# Patient Record
Sex: Female | Born: 1990 | Race: Black or African American | Hispanic: No | Marital: Single | Smoking: Former smoker
Health system: Southern US, Community
[De-identification: ages and names within clinical notes are randomized; demographics above are authoritative.]

## PROBLEM LIST (undated history)

## (undated) ENCOUNTER — Emergency Department (HOSPITAL_COMMUNITY): Discharge status: Against Medical Advice | Payer: Managed Care, Other (non HMO)

## (undated) DIAGNOSIS — O26649 Intrahepatic cholestasis of pregnancy, unspecified trimester: Secondary | ICD-10-CM

## (undated) DIAGNOSIS — B009 Herpesviral infection, unspecified: Secondary | ICD-10-CM

## (undated) DIAGNOSIS — B379 Candidiasis, unspecified: Secondary | ICD-10-CM

## (undated) DIAGNOSIS — R350 Frequency of micturition: Secondary | ICD-10-CM

## (undated) DIAGNOSIS — D649 Anemia, unspecified: Secondary | ICD-10-CM

## (undated) DIAGNOSIS — N390 Urinary tract infection, site not specified: Secondary | ICD-10-CM

## (undated) DIAGNOSIS — N898 Other specified noninflammatory disorders of vagina: Principal | ICD-10-CM

## (undated) DIAGNOSIS — R319 Hematuria, unspecified: Secondary | ICD-10-CM

## (undated) DIAGNOSIS — N920 Excessive and frequent menstruation with regular cycle: Secondary | ICD-10-CM

## (undated) DIAGNOSIS — K831 Obstruction of bile duct: Secondary | ICD-10-CM

## (undated) DIAGNOSIS — Z309 Encounter for contraceptive management, unspecified: Secondary | ICD-10-CM

## (undated) HISTORY — DX: Encounter for contraceptive management, unspecified: Z30.9

## (undated) HISTORY — DX: Other specified noninflammatory disorders of vagina: N89.8

## (undated) HISTORY — DX: Candidiasis, unspecified: B37.9

## (undated) HISTORY — DX: Frequency of micturition: R35.0

## (undated) HISTORY — PX: NO PAST SURGERIES: SHX2092

## (undated) HISTORY — DX: Intrahepatic cholestasis of pregnancy, unspecified trimester: O26.649

## (undated) HISTORY — DX: Hematuria, unspecified: R31.9

## (undated) HISTORY — DX: Obstruction of bile duct: K83.1

## (undated) HISTORY — DX: Excessive and frequent menstruation with regular cycle: N92.0

---
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
## 2010-12-02 ENCOUNTER — Emergency Department (HOSPITAL_COMMUNITY)
Admission: EM | Admit: 2010-12-02 | Discharge: 2010-12-02 | Disposition: A | Discharge status: Home / Self Care | Payer: 59 | Attending: Emergency Medicine | Admitting: Emergency Medicine

## 2010-12-02 DIAGNOSIS — R42 Dizziness and giddiness: Secondary | ICD-10-CM | POA: Insufficient documentation

## 2010-12-02 DIAGNOSIS — E860 Dehydration: Secondary | ICD-10-CM | POA: Insufficient documentation

## 2010-12-02 DIAGNOSIS — R55 Syncope and collapse: Secondary | ICD-10-CM | POA: Insufficient documentation

## 2010-12-02 DIAGNOSIS — N390 Urinary tract infection, site not specified: Secondary | ICD-10-CM | POA: Insufficient documentation

## 2010-12-02 DIAGNOSIS — R350 Frequency of micturition: Secondary | ICD-10-CM | POA: Insufficient documentation

## 2010-12-02 DIAGNOSIS — R300 Dysuria: Secondary | ICD-10-CM | POA: Insufficient documentation

## 2010-12-02 LAB — URINE MICROSCOPIC-ADD ON

## 2010-12-02 LAB — POCT I-STAT, CHEM 8
BUN: 12 mg/dL (ref 6–23)
Calcium, Ion: 1.21 mmol/L (ref 1.12–1.32)
Chloride: 104 mEq/L (ref 96–112)
HCT: 41 % (ref 36.0–46.0)
Sodium: 138 mEq/L (ref 135–145)
TCO2: 25 mmol/L (ref 0–100)

## 2010-12-02 LAB — URINALYSIS, ROUTINE W REFLEX MICROSCOPIC
Ketones, ur: NEGATIVE mg/dL
Leukocytes, UA: NEGATIVE
Protein, ur: 30 mg/dL — AB
Urine Glucose, Fasting: NEGATIVE mg/dL
pH: 5.5 (ref 5.0–8.0)

## 2010-12-04 LAB — URINE CULTURE

## 2012-06-06 ENCOUNTER — Emergency Department (HOSPITAL_COMMUNITY)
Admission: EM | Admit: 2012-06-06 | Discharge: 2012-06-06 | Disposition: A | Discharge status: Home / Self Care | Payer: Managed Care, Other (non HMO) | Attending: Emergency Medicine | Admitting: Emergency Medicine

## 2012-06-06 ENCOUNTER — Encounter (HOSPITAL_COMMUNITY): Payer: Self-pay | Admitting: *Deleted

## 2012-06-06 DIAGNOSIS — R300 Dysuria: Secondary | ICD-10-CM | POA: Insufficient documentation

## 2012-06-06 DIAGNOSIS — Z880 Allergy status to penicillin: Secondary | ICD-10-CM | POA: Insufficient documentation

## 2012-06-06 LAB — URINALYSIS, ROUTINE W REFLEX MICROSCOPIC
Glucose, UA: NEGATIVE mg/dL
Hgb urine dipstick: NEGATIVE
Leukocytes, UA: NEGATIVE
pH: 6 (ref 5.0–8.0)

## 2012-06-06 LAB — PREGNANCY, URINE: Preg Test, Ur: NEGATIVE

## 2012-06-06 LAB — WET PREP, GENITAL: Trich, Wet Prep: NONE SEEN

## 2012-06-06 MED ORDER — DOXYCYCLINE HYCLATE 100 MG PO CAPS: 100.0000 mg | ORAL_CAPSULE | Freq: Two times a day (BID) | ORAL | Status: AC

## 2012-06-06 MED ORDER — FLUCONAZOLE 200 MG PO TABS: ORAL_TABLET | ORAL | Status: DC

## 2012-06-06 MED ORDER — METRONIDAZOLE 500 MG PO TABS: 500.0000 mg | ORAL_TABLET | Freq: Two times a day (BID) | ORAL | Status: AC

## 2012-06-06 NOTE — ED Provider Notes (Signed)
History     CSN: [id number]  Arrival date & time 06/06/12  1125   First MD Initiated Contact with Patient 06/06/12 1218      Chief Complaint  Patient presents with  . Dysuria    (Consider location/radiation/quality/duration/timing/severity/associated sxs/prior treatment) Patient is a 21 y.o. female presenting with dysuria. The history is provided by the patient.  Dysuria  This is a new problem. The current episode started more than 1 week ago. The problem occurs intermittently. The problem has not changed since onset.The quality of the pain is described as burning. The pain is mild. There has been no fever. She is sexually active. There is no history of pyelonephritis. Associated symptoms include frequency and urgency. Pertinent negatives include no chills, no nausea, no vomiting, no discharge, no hematuria, no hesitancy, no possible pregnancy and no flank pain. She has tried nothing for the symptoms. Her past medical history does not include recurrent UTIs.    History reviewed. No pertinent past medical history.  History reviewed. No pertinent past surgical history.  No family history on file.  History  Substance Use Topics  . Smoking status: Never Smoker   . Smokeless tobacco: Not on file  . Alcohol Use: No    OB History    Grav Para Term Preterm Abortions TAB SAB Ect Mult Living                  Review of Systems  Constitutional: Negative for chills, activity change and appetite change.  Gastrointestinal: Negative for nausea, vomiting and abdominal pain.  Genitourinary: Positive for dysuria, urgency and frequency. Negative for hesitancy, hematuria, flank pain, vaginal bleeding, vaginal discharge, difficulty urinating, genital sores, vaginal pain, menstrual problem and pelvic pain.  Musculoskeletal: Negative for back pain.  All other systems reviewed and are negative.    Allergies  Penicillins  Home Medications   Current Outpatient Rx  Name Route Sig Dispense  Refill  . DOXYCYCLINE HYCLATE 100 MG PO CAPS Oral Take 1 capsule (100 mg total) by mouth 2 (two) times daily. 20 capsule 0  . FLUCONAZOLE 200 MG PO TABS  One tablet po as single dose 1 tablet 0  . METRONIDAZOLE 500 MG PO TABS Oral Take 1 tablet (500 mg total) by mouth 2 (two) times daily. 14 tablet 0    BP 125/84  Pulse 94  Temp 98.4 F (36.9 C) (Oral)  Resp 18  Ht 5\' 2"  (1.575 m)  Wt 126 lb (57.153 kg)  BMI 23.05 kg/m2  SpO2 100%  Physical Exam  Nursing note and vitals reviewed. Constitutional: She is oriented to person, place, and time. She appears well-developed and well-nourished. No distress.  HENT:  Head: Normocephalic and atraumatic.  Mouth/Throat: Oropharynx is clear and moist.  Cardiovascular: Normal rate, regular rhythm, normal heart sounds and intact distal pulses.   No murmur heard. Pulmonary/Chest: Effort normal and breath sounds normal. No respiratory distress.  Abdominal: Soft. She exhibits no distension and no mass. There is no tenderness. There is no rebound and no guarding.  Genitourinary: Uterus normal. There is no tenderness or injury on the right labia. There is no tenderness or injury on the left labia. Cervix exhibits no motion tenderness, no discharge and no friability. Right adnexum displays no mass and no tenderness. Left adnexum displays no mass and no tenderness. No erythema around the vagina. No foreign body around the vagina. No signs of injury around the vagina. Vaginal discharge found.       Mild to moderate  whitish discharge present in the vaginal vault.  No adnexal masses, tenderness or CMT on exam.  No vaginal bleeding. Cervix is nml appearing.    Musculoskeletal: Normal range of motion.  Neurological: She is alert and oriented to person, place, and time. She exhibits normal muscle tone. Coordination normal.  Skin: Skin is warm and dry.    ED Course  Procedures (including critical care time)  Results for orders placed during the hospital  encounter of 06/06/12  URINALYSIS, ROUTINE W REFLEX MICROSCOPIC      Component Value Range   Color, Urine YELLOW  YELLOW   APPearance CLEAR  CLEAR   Specific Gravity, Urine 1.025  1.005 - 1.030   pH 6.0  5.0 - 8.0   Glucose, UA NEGATIVE  NEGATIVE mg/dL   Hgb urine dipstick NEGATIVE  NEGATIVE   Bilirubin Urine NEGATIVE  NEGATIVE   Ketones, ur NEGATIVE  NEGATIVE mg/dL   Protein, ur NEGATIVE  NEGATIVE mg/dL   Urobilinogen, UA 0.2  0.0 - 1.0 mg/dL   Nitrite NEGATIVE  NEGATIVE   Leukocytes, UA NEGATIVE  NEGATIVE  PREGNANCY, URINE      Component Value Range   Preg Test, Ur NEGATIVE  NEGATIVE  GC/CHLAMYDIA PROBE AMP, GENITAL      Component Value Range   GC Probe Amp, Genital NEGATIVE  NEGATIVE   Chlamydia, DNA Probe NEGATIVE  NEGATIVE  WET PREP, GENITAL      Component Value Range   Yeast Wet Prep HPF POC FEW (*) NONE SEEN   Trich, Wet Prep NONE SEEN  NONE SEEN   Clue Cells Wet Prep HPF POC RARE (*) NONE SEEN   WBC, Wet Prep HPF POC RARE (*) NONE SEEN  URINE CULTURE      Component Value Range   Specimen Description URINE, CLEAN CATCH     Special Requests NONE     Culture  Setup Time 06/07/2012 00:29     Colony Count 30,000 COLONIES/ML     Culture       Value: LACTOBACILLUS SPECIES     Note: Standardized susceptibility testing for this organism is not available.   Report Status 06/08/2012 FINAL        1. Dysuria       MDM    gc chlaymdia and urie cultures pending  Sexually active patient with dysuria sx's.  She is otherwise well appearing , abd is NT.  No guarding or rebound tenderness.  Doubtful of TOA or PID.  Will treat with flagyl and doxy  Advised pt that she will need recheck by her GYN or to return here if her sx's worsen.  She verbalized understanding and agreed to care plan   The patient appears reasonably screened and/or stabilized for discharge and I doubt any other medical condition or other [facility] requiring further screening, evaluation, or treatment in the ED  at this time prior to discharge.     Sherald Balbuena L. Bocephus Cali, [place] 06/10/12 1216

## 2012-06-06 NOTE — ED Notes (Signed)
Dysuria, frequency,no n/v. For 10 days  No fever

## 2012-06-07 LAB — GC/CHLAMYDIA PROBE AMP, GENITAL
Chlamydia, DNA Probe: NEGATIVE
GC Probe Amp, Genital: NEGATIVE

## 2012-06-08 LAB — URINE CULTURE: Colony Count: 30000

## 2012-06-09 NOTE — ED Notes (Signed)
+   Urine Chart sent to EDP office for review. 

## 2012-06-10 NOTE — ED Provider Notes (Signed)
Medical screening examination/treatment/procedure(s) were performed by non-physician practitioner and as supervising physician I was immediately available for consultation/collaboration.   Yoland Scherr L Averyanna Sax, MD 06/10/12 1233 

## 2012-06-10 NOTE — ED Notes (Signed)
Chart returned from EDP office for review. No further treatment per [facility] PA-C.

## 2012-09-03 ENCOUNTER — Encounter (HOSPITAL_COMMUNITY): Payer: Self-pay | Admitting: Emergency Medicine

## 2012-09-03 ENCOUNTER — Emergency Department (HOSPITAL_COMMUNITY)
Admission: EM | Admit: 2012-09-03 | Discharge: 2012-09-03 | Disposition: A | Discharge status: Home / Self Care | Payer: Managed Care, Other (non HMO) | Attending: Emergency Medicine | Admitting: Emergency Medicine

## 2012-09-03 DIAGNOSIS — L258 Unspecified contact dermatitis due to other agents: Secondary | ICD-10-CM | POA: Insufficient documentation

## 2012-09-03 DIAGNOSIS — T7840XA Allergy, unspecified, initial encounter: Secondary | ICD-10-CM

## 2012-09-03 MED ORDER — FAMOTIDINE IN NACL 20-0.9 MG/50ML-% IV SOLN
20.0000 mg | Freq: Once | INTRAVENOUS | Status: AC
  Administered 2012-09-03: 20 mg via INTRAVENOUS
  Filled 2012-09-03: qty 50

## 2012-09-03 MED ORDER — METHYLPREDNISOLONE SODIUM SUCC 125 MG IJ SOLR
125.0000 mg | Freq: Once | INTRAMUSCULAR | Status: AC
  Administered 2012-09-03: 125 mg via INTRAVENOUS
  Filled 2012-09-03: qty 2

## 2012-09-03 MED ORDER — PREDNISONE 50 MG PO TABS: ORAL_TABLET | ORAL | Status: DC

## 2012-09-03 MED ORDER — DIPHENHYDRAMINE HCL 50 MG/ML IJ SOLN
25.0000 mg | Freq: Once | INTRAMUSCULAR | Status: AC
  Administered 2012-09-03: 25 mg via INTRAVENOUS
  Filled 2012-09-03: qty 1

## 2012-09-03 MED ORDER — SODIUM CHLORIDE 0.9 % IV BOLUS (SEPSIS)
1000.0000 mL | Freq: Once | INTRAVENOUS | Status: AC
  Administered 2012-09-03: 1000 mL via INTRAVENOUS

## 2012-09-03 NOTE — ED Notes (Signed)
Pt states increased rash and itching for two weeks. Generalized to body per patient. Pt states started a new birth control pill three weeks ago but has not changed anything else. Denies SOB or throat swelling

## 2012-09-03 NOTE — ED Notes (Signed)
Patient with no complaints at this time. Respirations even and unlabored. Skin warm/dry. Discharge instructions reviewed with patient at this time. Patient given opportunity to voice concerns/ask questions. IV removed per policy and band-aid applied to site. Patient discharged at this time and left Emergency Department with steady gait.  

## 2012-09-03 NOTE — ED Provider Notes (Signed)
History   This chart was scribed for Donnetta Hutching, MD by Charolett Bumpers, ER Scribe. The patient was seen in room APA03/APA03. Patient's care was started at 1047.   CSN: [id number]  Arrival date & time 09/03/12  1004   First MD Initiated Contact with Patient 09/03/12 1047      Chief Complaint  Patient presents with  . Allergic Reaction  . Pruritis  . Rash    The history is provided by the patient. No language interpreter was used.  MELENY ROCHESTER is a 21 y.o. female who presents to the Emergency Department complaining of constant, moderate generalized pruritis for the past 2 weeks with the worse location on bottom of feet. She reports an associated rash to her inner arms. She states that she started taking a birth control pill, Trisprintec, a couple of months ago. She states that she has also been drinking Biotin for her hair for the past 1.5 weeks after the itching started. She denies any modifying factors. She denies any other new exposures. She denies any SOB, difficulty swallowing or breathing. She states she is otherwise normally healthy.   History reviewed. No pertinent past medical history.  History reviewed. No pertinent past surgical history.  History reviewed. No pertinent family history.  History  Substance Use Topics  . Smoking status: Never Smoker   . Smokeless tobacco: Not on file  . Alcohol Use: No    OB History    Grav Para Term Preterm Abortions TAB SAB Ect Mult Living                  Review of Systems A complete 10 system review of systems was obtained and all systems are negative except as noted in the HPI and PMH.   Allergies  Penicillins  Home Medications   Current Outpatient Rx  Name  Route  Sig  Dispense  Refill  . BIOTIN PO   Oral   Take 3 tablets by mouth daily.         Marland Kitchen FLUCONAZOLE 200 MG PO TABS   Oral   Take 200 mg by mouth daily.         Marland Kitchen NORGESTIM-ETH ESTRAD TRIPHASIC 0.18/0.215/0.25 MG-35 MCG PO TABS   Oral  Take 1 tablet by mouth daily.           BP 125/80  Pulse 90  Temp 98.5 F (36.9 C) (Oral)  Ht 5\' 2"  (1.575 m)  Wt 130 lb (58.968 kg)  BMI 23.78 kg/m2  SpO2 98%  LMP 08/13/2012  Physical Exam  Nursing note and vitals reviewed. Constitutional: She is oriented to person, place, and time. She appears well-developed and well-nourished.  HENT:  Head: Normocephalic and atraumatic.  Eyes: Conjunctivae normal and EOM are normal. Pupils are equal, round, and reactive to light.  Neck: Normal range of motion. Neck supple.  Cardiovascular: Normal rate, regular rhythm and normal heart sounds.   Pulmonary/Chest: Effort normal and breath sounds normal.  Abdominal: Soft. Bowel sounds are normal.  Musculoskeletal: Normal range of motion.  Neurological: She is alert and oriented to person, place, and time.  Skin: Skin is warm and dry. Rash noted. Rash is papular.       On bilateral antecubitals, a discrete fine papular rash.   Psychiatric: She has a normal mood and affect.    ED Course  Procedures (including critical care time)  DIAGNOSTIC STUDIES: Oxygen Saturation is 98% on room air, normal by my interpretation.    COORDINATION  OF CARE:  11:00-Medication Orders: Methylprednisolone sodium succinate (Solu-medrol) 125 mg/2 mL injection 125 mg-once; Diphenhydramine (Benadryl) injection 25 mg-once; Famotidine (Pepcid) IVPB 20 mg-once; Sodium chloride 0.9% bolus 1,000 mL-once  11:15-Discussed planned course of treatment with the patient including Solu-medrol, Benadryl and Pepcid her in ED and d/c home with Claritin and Prednisone, who is agreeable at this time.    Labs Reviewed - No data to display No results found.   No diagnosis found.    MDM  Mild allergic reaction, uncertain etiology. Patient is a good response to IV Solu-Medrol, Pepcid, Benadryl.  A shared home and prednisone 50 mg daily for 6 days, OTC Claritin and Pepcid   I personally performed the services described in  this documentation, which was scribed in my presence. The recorded information has been reviewed and is accurate.      Donnetta Hutching, MD 09/03/12 1401

## 2012-09-21 ENCOUNTER — Other Ambulatory Visit (HOSPITAL_COMMUNITY): Payer: Self-pay | Admitting: Internal Medicine

## 2012-09-21 DIAGNOSIS — N63 Unspecified lump in unspecified breast: Secondary | ICD-10-CM

## 2012-09-26 ENCOUNTER — Ambulatory Visit (HOSPITAL_COMMUNITY): Payer: Managed Care, Other (non HMO) | Attending: Internal Medicine

## 2012-10-12 ENCOUNTER — Emergency Department (HOSPITAL_COMMUNITY)
Admission: EM | Admit: 2012-10-12 | Discharge: 2012-10-12 | Disposition: A | Discharge status: Home / Self Care | Payer: Managed Care, Other (non HMO) | Attending: Emergency Medicine | Admitting: Emergency Medicine

## 2012-10-12 ENCOUNTER — Encounter (HOSPITAL_COMMUNITY): Payer: Self-pay | Admitting: *Deleted

## 2012-10-12 DIAGNOSIS — N3090 Cystitis, unspecified without hematuria: Secondary | ICD-10-CM | POA: Insufficient documentation

## 2012-10-12 DIAGNOSIS — R300 Dysuria: Secondary | ICD-10-CM | POA: Insufficient documentation

## 2012-10-12 DIAGNOSIS — R3915 Urgency of urination: Secondary | ICD-10-CM | POA: Insufficient documentation

## 2012-10-12 DIAGNOSIS — R109 Unspecified abdominal pain: Secondary | ICD-10-CM | POA: Insufficient documentation

## 2012-10-12 DIAGNOSIS — Z79899 Other long term (current) drug therapy: Secondary | ICD-10-CM | POA: Insufficient documentation

## 2012-10-12 DIAGNOSIS — R319 Hematuria, unspecified: Secondary | ICD-10-CM | POA: Insufficient documentation

## 2012-10-12 DIAGNOSIS — R350 Frequency of micturition: Secondary | ICD-10-CM | POA: Insufficient documentation

## 2012-10-12 LAB — URINALYSIS, ROUTINE W REFLEX MICROSCOPIC
Nitrite: NEGATIVE
Protein, ur: 30 mg/dL — AB
Specific Gravity, Urine: >1.03 — ABNORMAL HIGH (ref 1.005–1.030)
Urobilinogen, UA: 1 mg/dL (ref 0.0–1.0)

## 2012-10-12 LAB — URINE MICROSCOPIC-ADD ON

## 2012-10-12 LAB — POCT PREGNANCY, URINE: Preg Test, Ur: NEGATIVE

## 2012-10-12 MED ORDER — PHENAZOPYRIDINE HCL 200 MG PO TABS: 200.0000 mg | ORAL_TABLET | Freq: Three times a day (TID) | ORAL | Status: DC

## 2012-10-12 MED ORDER — CIPROFLOXACIN HCL 500 MG PO TABS: 500.0000 mg | ORAL_TABLET | Freq: Two times a day (BID) | ORAL | Status: DC

## 2012-10-12 NOTE — ED Provider Notes (Signed)
Medical screening examination/treatment/procedure(s) were performed by non-physician practitioner and as supervising physician I was immediately available for consultation/collaboration.   Joya Gaskins, MD 10/12/12 [phone]

## 2012-10-12 NOTE — ED Notes (Signed)
Frequency of urination,with itching,hematuria.  No NVD.  Alert.

## 2012-10-12 NOTE — Discharge Instructions (Signed)
Urinary Tract Infection  A urinary tract infection (UTI) is often caused by a germ (bacteria). A UTI is usually helped with medicine (antibiotics) that kills germs. Take all the medicine until it is gone. Do this even if you are feeling better. You are usually better in 7 to 10 days.  HOME CARE    Drink enough water and fluids to keep your pee (urine) clear or pale yellow. Drink:   Cranberry juice.   Water.   Avoid:   Caffeine.   Tea.   Bubbly (carbonated) drinks.   Alcohol.   Only take medicine as told by your doctor.   To prevent further infections:   Pee often.   After pooping (bowel movement), women should wipe from front to back. Use each tissue only once.   Pee before and after having sex (intercourse).  Ask your doctor when your test results will be ready. Make sure you follow up and get your test results.   GET HELP RIGHT AWAY IF:    There is very bad back pain or lower belly (abdominal) pain.   You get the chills.   You have a fever.   Your baby is older than 3 months with a rectal temperature of 102 F (38.9 C) or higher.   Your baby is 3 months old or younger with a rectal temperature of 100.4 F (38 C) or higher.   You feel sick to your stomach (nauseous) or throw up (vomit).   There is continued burning with peeing.   Your problems are not better in 3 days. Return sooner if you are getting worse.  MAKE SURE YOU:    Understand these instructions.   Will watch your condition.   Will get help right away if you are not doing well or get worse.  Document Released: 03/28/2008 Document Revised: 01/02/2012 Document Reviewed: 03/28/2008  [employer] Patient Information 2013 [employer].

## 2012-10-12 NOTE — ED Provider Notes (Signed)
History     CSN: [id number]  Arrival date & time 10/12/12  1351   None     Chief Complaint  Patient presents with  . Dysuria    HPI Alicia [name] is a 21 y.o. female who presents to the ED with dysuria. The symptoms started 3 or days ago. Associated symptoms include frequent urination and urgency. Has had UTI's before and this feels the same.Condoms and OC's for birth control. Current sex partner x 6 months.  Just had pap and yearly physical and STI screening 3 weeks ago and is scheduled to return due to abnormal pap smear. The history was provided by the patient.  History reviewed. No pertinent past medical history.  History reviewed. No pertinent past surgical history.  History reviewed. No pertinent family history.  History  Substance Use Topics  . Smoking status: Never Smoker   . Smokeless tobacco: Not on file  . Alcohol Use: No    OB History    Grav Para Term Preterm Abortions TAB SAB Ect Mult Living                  Review of Systems  Constitutional: Negative for fever, chills, diaphoresis and fatigue.  HENT: Negative for ear pain, congestion, sore throat, facial swelling, neck pain, neck stiffness, dental problem and sinus pressure.   Eyes: Negative for photophobia, pain and discharge.  Respiratory: Negative for cough, chest tightness and wheezing.   Gastrointestinal: Positive for abdominal pain (pressure). Negative for nausea, vomiting, diarrhea, constipation and abdominal distention.  Genitourinary: Positive for urgency, frequency and hematuria. Negative for dysuria, flank pain, vaginal bleeding, vaginal discharge, difficulty urinating and vaginal pain.  Musculoskeletal: Negative for myalgias, back pain and gait problem.  Skin: Negative for color change and rash.  Neurological: Negative for dizziness, speech difficulty, weakness, light-headedness, numbness and headaches.  Psychiatric/Behavioral: Negative for confusion and agitation.    Allergies   Penicillins  Home Medications   Current Outpatient Rx  Name  Route  Sig  Dispense  Refill  . NORGESTIM-ETH ESTRAD TRIPHASIC 0.18/0.215/0.25 MG-35 MCG PO TABS   Oral   Take 1 tablet by mouth daily.           BP 120/81  Pulse 98  Temp 98.3 F (36.8 C) (Oral)  Resp 18  Ht 5\' 2"  (1.575 m)  Wt 130 lb (58.968 kg)  BMI 23.78 kg/m2  SpO2 100%  LMP 09/26/2012  Physical Exam  Nursing note and vitals reviewed. Constitutional: She is oriented to person, place, and time. She appears well-developed and well-nourished. No distress.  HENT:  Head: Normocephalic and atraumatic.  Eyes: EOM are normal. Pupils are equal, round, and reactive to light.  Neck: Neck supple.  Cardiovascular: Normal rate and regular rhythm.   Pulmonary/Chest: Effort normal. No respiratory distress. She has no wheezes.  Abdominal: Soft. There is tenderness in the suprapubic area. There is no rebound, no guarding and no CVA tenderness.  Musculoskeletal: Normal range of motion. She exhibits no edema.  Neurological: She is alert and oriented to person, place, and time. No cranial nerve deficit.  Skin: Skin is warm and dry.  Psychiatric: She has a normal mood and affect. Her behavior is normal. Judgment and thought content normal.   Results for orders placed during the hospital encounter of 10/12/12 (from the past 24 hour(s))  URINALYSIS, ROUTINE W REFLEX MICROSCOPIC     Status: Abnormal   Collection Time   10/12/12  2:15 PM      Component  Value Range   Color, Urine YELLOW  YELLOW   APPearance CLOUDY (*) CLEAR   Specific Gravity, Urine >1.030 (*) 1.005 - 1.030   pH 6.0  5.0 - 8.0   Glucose, UA NEGATIVE  NEGATIVE mg/dL   Hgb urine dipstick LARGE (*) NEGATIVE   Bilirubin Urine NEGATIVE  NEGATIVE   Ketones, ur TRACE (*) NEGATIVE mg/dL   Protein, ur 30 (*) NEGATIVE mg/dL   Urobilinogen, UA 1.0  0.0 - 1.0 mg/dL   Nitrite NEGATIVE  NEGATIVE   Leukocytes, UA MODERATE (*) NEGATIVE  URINE MICROSCOPIC-ADD ON      Status: Abnormal   Collection Time   10/12/12  2:15 PM      Component Value Range   Squamous Epithelial / LPF FEW (*) RARE   WBC, UA 21-50  <3 WBC/hpf   RBC / HPF TOO NUMEROUS TO COUNT  <3 RBC/hpf   Bacteria, UA FEW (*) RARE  POCT PREGNANCY, URINE     Status: Normal   Collection Time   10/12/12  2:17 PM      Component Value Range   Preg Test, Ur NEGATIVE  NEGATIVE     Assessment: 21 y.o. female with dysuria   UTI/Cystitis  Plan:  Cipro 500 mg Rx   Pyridium Rx   Follow up with PCP as scheduled, return here as needed. Discussed with the patient and all questioned fully answered. She will returnif any problems arise.    Medication List     As of 10/12/2012  3:32 PM    START taking these medications         ciprofloxacin 500 MG tablet   Commonly known as: CIPRO   Take 1 tablet (500 mg total) by mouth every 12 (twelve) hours.      phenazopyridine 200 MG tablet   Commonly known as: PYRIDIUM   Take 1 tablet (200 mg total) by mouth 3 (three) times daily.      ASK your doctor about these medications         TRI-SPRINTEC 0.18/0.215/0.25 MG-35 MCG tablet   Generic drug: Norgestimate-Ethinyl Estradiol Triphasic          Where to get your medications    These are the prescriptions that you need to pick up.   You may get these medications from any pharmacy.         ciprofloxacin 500 MG tablet   phenazopyridine 200 MG tablet           Procedures       Janne Napoleon, NP 10/12/12 [phone]

## 2012-10-22 LAB — URINE CULTURE: Colony Count: >=100000

## 2012-12-03 ENCOUNTER — Emergency Department (HOSPITAL_COMMUNITY): Payer: Managed Care, Other (non HMO)

## 2012-12-03 ENCOUNTER — Encounter (HOSPITAL_COMMUNITY): Payer: Self-pay | Admitting: *Deleted

## 2012-12-03 ENCOUNTER — Emergency Department (HOSPITAL_COMMUNITY)
Admission: EM | Admit: 2012-12-03 | Discharge: 2012-12-03 | Disposition: A | Discharge status: Home / Self Care | Payer: Managed Care, Other (non HMO) | Attending: Emergency Medicine | Admitting: Emergency Medicine

## 2012-12-03 DIAGNOSIS — R350 Frequency of micturition: Secondary | ICD-10-CM | POA: Insufficient documentation

## 2012-12-03 DIAGNOSIS — R319 Hematuria, unspecified: Secondary | ICD-10-CM | POA: Insufficient documentation

## 2012-12-03 DIAGNOSIS — R300 Dysuria: Secondary | ICD-10-CM | POA: Insufficient documentation

## 2012-12-03 DIAGNOSIS — R110 Nausea: Secondary | ICD-10-CM | POA: Insufficient documentation

## 2012-12-03 DIAGNOSIS — R109 Unspecified abdominal pain: Secondary | ICD-10-CM | POA: Insufficient documentation

## 2012-12-03 DIAGNOSIS — Z3202 Encounter for pregnancy test, result negative: Secondary | ICD-10-CM | POA: Insufficient documentation

## 2012-12-03 DIAGNOSIS — Z79899 Other long term (current) drug therapy: Secondary | ICD-10-CM | POA: Insufficient documentation

## 2012-12-03 DIAGNOSIS — R002 Palpitations: Secondary | ICD-10-CM | POA: Insufficient documentation

## 2012-12-03 LAB — URINALYSIS, ROUTINE W REFLEX MICROSCOPIC
Bilirubin Urine: NEGATIVE
Glucose, UA: NEGATIVE mg/dL
Ketones, ur: NEGATIVE mg/dL
Nitrite: NEGATIVE
Protein, ur: 30 mg/dL — AB
pH: 6 (ref 5.0–8.0)

## 2012-12-03 LAB — URINE MICROSCOPIC-ADD ON

## 2012-12-03 MED ORDER — PHENAZOPYRIDINE HCL 200 MG PO TABS: ORAL_TABLET | ORAL | Status: DC

## 2012-12-03 MED ORDER — CIPROFLOXACIN HCL 500 MG PO TABS: 500.0000 mg | ORAL_TABLET | Freq: Two times a day (BID) | ORAL | Status: DC

## 2012-12-03 NOTE — ED Provider Notes (Signed)
History     CSN: [id number]  Arrival date & time 12/03/12  0802   First MD Initiated Contact with Patient 12/03/12 [phone]      Chief Complaint  Patient presents with  . Dysuria  . Urinary Frequency    (Consider location/radiation/quality/duration/timing/severity/associated sxs/prior treatment) Patient is a 22 y.o. female presenting with dysuria and frequency. The history is provided by the patient.  Dysuria  This is a new problem. The current episode started more than 1 week ago. The problem occurs every urination. The problem has not changed since onset.The quality of the pain is described as burning. The pain is moderate. There has been no fever. She is sexually active. There is no history of pyelonephritis. Associated symptoms include frequency and hematuria. Pertinent negatives include no chills and no nausea. Treatments tried: water and cranberry juice. Her past medical history does not include kidney stones.  Urinary Frequency Pertinent negatives include no abdominal pain, arthralgias, chest pain, chills, coughing, nausea or neck pain.    History reviewed. No pertinent past medical history.  History reviewed. No pertinent past surgical history.  No family history on file.  History  Substance Use Topics  . Smoking status: Never Smoker   . Smokeless tobacco: Not on file  . Alcohol Use: No    OB History   Grav Para Term Preterm Abortions TAB SAB Ect Mult Living                  Review of Systems  Constitutional: Negative for chills and activity change.       All ROS Neg except as noted in HPI  HENT: Negative for nosebleeds and neck pain.   Eyes: Negative for photophobia and discharge.  Respiratory: Negative for cough, shortness of breath and wheezing.   Cardiovascular: Negative for chest pain and palpitations.  Gastrointestinal: Negative for nausea, abdominal pain and blood in stool.  Genitourinary: Positive for dysuria, frequency and hematuria.  Musculoskeletal:  Negative for back pain and arthralgias.  Skin: Negative.   Neurological: Negative for dizziness, seizures and speech difficulty.  Psychiatric/Behavioral: Negative for hallucinations and confusion.    Allergies  Penicillins  Home Medications   Current Outpatient Rx  Name  Route  Sig  Dispense  Refill  . ciprofloxacin (CIPRO) 500 MG tablet   Oral   Take 1 tablet (500 mg total) by mouth every 12 (twelve) hours.   10 tablet   0   . Norgestimate-Ethinyl Estradiol Triphasic (TRI-SPRINTEC) 0.18/0.215/0.25 MG-35 MCG tablet   Oral   Take 1 tablet by mouth daily.         . phenazopyridine (PYRIDIUM) 200 MG tablet   Oral   Take 1 tablet (200 mg total) by mouth 3 (three) times daily.   6 tablet   0     BP 133/80  Pulse 107  Temp(Src) 98.3 F (36.8 C) (Oral)  Ht 5\' 2"  (1.575 m)  Wt 130 lb (58.968 kg)  BMI 23.77 kg/m2  SpO2 98%  LMP 11/12/2012  Physical Exam  Nursing note and vitals reviewed. Constitutional: She is oriented to person, place, and time. She appears well-developed and well-nourished.  Non-toxic appearance.  HENT:  Head: Normocephalic.  Right Ear: Tympanic membrane and external ear normal.  Left Ear: Tympanic membrane and external ear normal.  Eyes: EOM and lids are normal. Pupils are equal, round, and reactive to light.  Neck: Normal range of motion. Neck supple. Carotid bruit is not present.  Cardiovascular: Regular rhythm, normal heart sounds, intact  distal pulses and normal pulses.  Tachycardia present.   Pulmonary/Chest: Breath sounds normal. No respiratory distress.  Abdominal: Soft. Bowel sounds are normal. There is no tenderness. There is no guarding.  Right and left lower abd pain to palpation and change of positions. No cvat.  Musculoskeletal: Normal range of motion.  Lymphadenopathy:       Head (right side): No submandibular adenopathy present.       Head (left side): No submandibular adenopathy present.    She has no cervical adenopathy.   Neurological: She is alert and oriented to person, place, and time. She has normal strength. No cranial nerve deficit or sensory deficit.  Skin: Skin is warm and dry.  Psychiatric: She has a normal mood and affect. Her speech is normal.    ED Course  Procedures (including critical care time)  Labs Reviewed  URINALYSIS, ROUTINE W REFLEX MICROSCOPIC  PREGNANCY, URINE   No results found.   No diagnosis found.    MDM  I have reviewed nursing notes, vital signs, and all appropriate lab and imaging results for this patient. UA reveals a yellow specimen with spGrav of 1.030.  TMTC red cell, few bacteria, . Pt made aware of findings, including negative preg test. CT scan ordered. CT scan neg for acute changes or problem. Culture of urine sent to lab. Pt started on cipro and pyridium. Pt to have urine rechecked in 7 to 10 days.       Kathie Dike, [place] 12/08/12 [phone]

## 2012-12-03 NOTE — ED Notes (Signed)
uti sx x 2 wks with painful urination, frequency, lower abd pain and nausea.

## 2012-12-04 LAB — URINE CULTURE

## 2012-12-08 ENCOUNTER — Telehealth (HOSPITAL_COMMUNITY): Payer: Self-pay | Admitting: Emergency Medicine

## 2012-12-08 NOTE — ED Notes (Signed)
+  Urine. Patient treated with Cipro. Sensitive to same. Per protocol MD. °

## 2012-12-08 NOTE — ED Notes (Signed)
Patient has +Urine culture. Checking to see if appropriately treated. °

## 2012-12-11 NOTE — ED Provider Notes (Signed)
Medical screening examination/treatment/procedure(s) were performed by non-physician practitioner and as supervising physician I was immediately available for consultation/collaboration.   Shantel Wesely, MD 12/11/12 1343 

## 2013-02-06 ENCOUNTER — Emergency Department (HOSPITAL_COMMUNITY)
Admission: EM | Admit: 2013-02-06 | Discharge: 2013-02-06 | Disposition: A | Discharge status: Home / Self Care | Payer: Managed Care, Other (non HMO) | Attending: Emergency Medicine | Admitting: Emergency Medicine

## 2013-02-06 ENCOUNTER — Encounter (HOSPITAL_COMMUNITY): Payer: Self-pay

## 2013-02-06 DIAGNOSIS — R3915 Urgency of urination: Secondary | ICD-10-CM | POA: Insufficient documentation

## 2013-02-06 DIAGNOSIS — R300 Dysuria: Secondary | ICD-10-CM | POA: Insufficient documentation

## 2013-02-06 DIAGNOSIS — N390 Urinary tract infection, site not specified: Secondary | ICD-10-CM

## 2013-02-06 DIAGNOSIS — F17200 Nicotine dependence, unspecified, uncomplicated: Secondary | ICD-10-CM | POA: Insufficient documentation

## 2013-02-06 DIAGNOSIS — Z3202 Encounter for pregnancy test, result negative: Secondary | ICD-10-CM | POA: Insufficient documentation

## 2013-02-06 LAB — URINE MICROSCOPIC-ADD ON

## 2013-02-06 LAB — URINALYSIS, ROUTINE W REFLEX MICROSCOPIC
Glucose, UA: NEGATIVE mg/dL
Specific Gravity, Urine: >1.03 — ABNORMAL HIGH (ref 1.005–1.030)
Urobilinogen, UA: 0.2 mg/dL (ref 0.0–1.0)
pH: 6 (ref 5.0–8.0)

## 2013-02-06 LAB — PREGNANCY, URINE: Preg Test, Ur: NEGATIVE

## 2013-02-06 MED ORDER — SULFAMETHOXAZOLE-TMP DS 800-160 MG PO TABS
1.0000 | ORAL_TABLET | Freq: Once | ORAL | Status: AC
  Administered 2013-02-06: 1 via ORAL
  Filled 2013-02-06: qty 1

## 2013-02-06 MED ORDER — SULFAMETHOXAZOLE-TRIMETHOPRIM 800-160 MG PO TABS: 1.0000 | ORAL_TABLET | Freq: Two times a day (BID) | ORAL | Status: DC

## 2013-02-06 NOTE — ED Provider Notes (Signed)
History     CSN: [id number]  Arrival date & time 02/06/13  1542   First MD Initiated Contact with Patient 02/06/13 1559      Chief Complaint  Patient presents with  . Urinary Frequency    (Consider location/radiation/quality/duration/timing/severity/associated sxs/prior treatment) Patient is a 22 y.o. female presenting with dysuria. The history is provided by the patient.  Dysuria  This is a recurrent problem. The current episode started more than 2 days ago. The problem occurs every urination. The problem has been gradually worsening. The quality of the pain is described as burning. The pain is moderate. There has been no fever. She is sexually active. There is no history of pyelonephritis. Associated symptoms include frequency and urgency. Pertinent negatives include no chills, no sweats, no nausea, no vomiting, no discharge, no hematuria, no hesitancy and no flank pain. She has tried nothing for the symptoms. Her past medical history is significant for recurrent UTIs.    History reviewed. No pertinent past medical history.  History reviewed. No pertinent past surgical history.  No family history on file.  History  Substance Use Topics  . Smoking status: Current Every Day Smoker  . Smokeless tobacco: Not on file  . Alcohol Use: No    OB History   Grav Para Term Preterm Abortions TAB SAB Ect Mult Living                  Review of Systems  Constitutional: Negative for chills, activity change and appetite change.  Respiratory: Negative for shortness of breath.   Cardiovascular: Negative for chest pain.  Gastrointestinal: Negative for nausea, vomiting and abdominal pain.  Genitourinary: Positive for dysuria, urgency and frequency. Negative for hesitancy, hematuria, flank pain, decreased urine volume, vaginal bleeding, vaginal discharge, difficulty urinating, genital sores, vaginal pain, menstrual problem and pelvic pain.  Musculoskeletal: Negative for back pain.  Skin:  Negative for rash.  All other systems reviewed and are negative.    Allergies  Penicillins  Home Medications   Current Outpatient Rx  Name  Route  Sig  Dispense  Refill  . ciprofloxacin (CIPRO) 500 MG tablet   Oral   Take 1 tablet (500 mg total) by mouth 2 (two) times daily.   14 tablet   0   . Norgestimate-Ethinyl Estradiol Triphasic (TRI-SPRINTEC) 0.18/0.215/0.25 MG-35 MCG tablet   Oral   Take 1 tablet by mouth daily.         . phenazopyridine (PYRIDIUM) 200 MG tablet      1 po tid with food   10 tablet   0     BP 124/79  Pulse 79  Temp(Src) 98.2 F (36.8 C) (Oral)  Resp 18  Ht 5\' 2"  (1.575 m)  Wt 133 lb (60.328 kg)  BMI 24.32 kg/m2  SpO2 100%  LMP 01/23/2013  Physical Exam  Nursing note and vitals reviewed. Constitutional: She is oriented to person, place, and time. She appears well-developed and well-nourished. No distress.  HENT:  Head: Normocephalic and atraumatic.  Cardiovascular: Normal rate, regular rhythm, normal heart sounds and intact distal pulses.   No murmur heard. Pulmonary/Chest: Effort normal and breath sounds normal. No respiratory distress. She exhibits no tenderness.  Abdominal: Soft. She exhibits no distension and no mass. There is no tenderness. There is no rebound and no guarding.  Musculoskeletal: Normal range of motion. She exhibits no edema and no tenderness.  Neurological: She is alert and oriented to person, place, and time. She exhibits normal muscle tone. Coordination normal.  Skin: Skin is warm and dry.    ED Course  Procedures (including critical care time)  Results for orders placed during the hospital encounter of 02/06/13  URINALYSIS, ROUTINE W REFLEX MICROSCOPIC      Result Value Range   Color, Urine YELLOW  YELLOW   APPearance CLEAR  CLEAR   Specific Gravity, Urine >1.030 (*) 1.005 - 1.030   pH 6.0  5.0 - 8.0   Glucose, UA NEGATIVE  NEGATIVE mg/dL   Hgb urine dipstick TRACE (*) NEGATIVE   Bilirubin Urine  NEGATIVE  NEGATIVE   Ketones, ur NEGATIVE  NEGATIVE mg/dL   Protein, ur TRACE (*) NEGATIVE mg/dL   Urobilinogen, UA 0.2  0.0 - 1.0 mg/dL   Nitrite NEGATIVE  NEGATIVE   Leukocytes, UA NEGATIVE  NEGATIVE  PREGNANCY, URINE      Result Value Range   Preg Test, Ur NEGATIVE  NEGATIVE  URINE MICROSCOPIC-ADD ON      Result Value Range   Squamous Epithelial / LPF FEW (*) RARE   WBC, UA TOO NUMEROUS TO COUNT  <3 WBC/hpf   RBC / HPF 3-6  <3 RBC/hpf   Bacteria, UA FEW (*) RARE         MDM   Previous Ed charts reviewed.  Urine culture from previous visit in 02/14 grew E. Coli with sensitivities to all abx listed.    Patient is well appearing.  No CVA tenderness, fever, vomiting to suggest pyelonephritis.  Abdomen is soft, NT.  She has appt with urologist on Monday 02/11/13.  Will treat with Bactrim DS and patient agrees to keep her urology appt.  Given instructions on proper hygiene and to void after intercourse.    The patient appears reasonably screened and/or stabilized for discharge and I doubt any other medical condition or other [facility] requiring further screening, evaluation, or treatment in the ED at this time prior to discharge.       Elisabel Hanover L. Zidane Renner, PA-C 02/07/13 0038

## 2013-02-06 NOTE — ED Notes (Signed)
Pt c/o frequent urination and slight burning with urination x 3 days.

## 2013-02-08 LAB — URINE CULTURE: Colony Count: 80000

## 2013-02-08 NOTE — ED Provider Notes (Signed)
Medical screening examination/treatment/procedure(s) were performed by non-physician practitioner and as supervising physician I was immediately available for consultation/collaboration.   Carmencita Cusic L Muzamil Harker, MD 02/08/13 1242 

## 2013-02-09 ENCOUNTER — Telehealth (HOSPITAL_COMMUNITY): Payer: Self-pay | Admitting: Emergency Medicine

## 2013-02-09 NOTE — ED Notes (Signed)
Positive urnc- treated per protocol.  

## 2013-03-05 ENCOUNTER — Emergency Department (HOSPITAL_COMMUNITY)
Admission: EM | Admit: 2013-03-05 | Discharge: 2013-03-05 | Disposition: A | Discharge status: Home / Self Care | Payer: Managed Care, Other (non HMO) | Attending: Emergency Medicine | Admitting: Emergency Medicine

## 2013-03-05 ENCOUNTER — Encounter (HOSPITAL_COMMUNITY): Payer: Self-pay | Admitting: Emergency Medicine

## 2013-03-05 DIAGNOSIS — R350 Frequency of micturition: Secondary | ICD-10-CM | POA: Insufficient documentation

## 2013-03-05 DIAGNOSIS — R300 Dysuria: Secondary | ICD-10-CM | POA: Insufficient documentation

## 2013-03-05 DIAGNOSIS — N390 Urinary tract infection, site not specified: Secondary | ICD-10-CM | POA: Insufficient documentation

## 2013-03-05 DIAGNOSIS — Z880 Allergy status to penicillin: Secondary | ICD-10-CM | POA: Insufficient documentation

## 2013-03-05 DIAGNOSIS — R109 Unspecified abdominal pain: Secondary | ICD-10-CM | POA: Insufficient documentation

## 2013-03-05 DIAGNOSIS — Z79899 Other long term (current) drug therapy: Secondary | ICD-10-CM | POA: Insufficient documentation

## 2013-03-05 DIAGNOSIS — F17200 Nicotine dependence, unspecified, uncomplicated: Secondary | ICD-10-CM | POA: Insufficient documentation

## 2013-03-05 DIAGNOSIS — Z3202 Encounter for pregnancy test, result negative: Secondary | ICD-10-CM | POA: Insufficient documentation

## 2013-03-05 LAB — URINE MICROSCOPIC-ADD ON

## 2013-03-05 LAB — URINALYSIS, ROUTINE W REFLEX MICROSCOPIC
Nitrite: POSITIVE — AB
Protein, ur: >300 mg/dL — AB
pH: 6 (ref 5.0–8.0)

## 2013-03-05 MED ORDER — NITROFURANTOIN MONOHYD MACRO 100 MG PO CAPS: 100.0000 mg | ORAL_CAPSULE | Freq: Two times a day (BID) | ORAL | Status: DC

## 2013-03-05 MED ORDER — PHENAZOPYRIDINE HCL 100 MG PO TABS
200.0000 mg | ORAL_TABLET | Freq: Once | ORAL | Status: AC
  Administered 2013-03-05: 200 mg via ORAL
  Filled 2013-03-05: qty 2

## 2013-03-05 MED ORDER — HYDROCODONE-ACETAMINOPHEN 5-325 MG PO TABS
1.0000 | ORAL_TABLET | Freq: Once | ORAL | Status: AC
  Administered 2013-03-05: 1 via ORAL
  Filled 2013-03-05: qty 1

## 2013-03-05 MED ORDER — NITROFURANTOIN MONOHYD MACRO 100 MG PO CAPS
100.0000 mg | ORAL_CAPSULE | Freq: Once | ORAL | Status: AC
  Administered 2013-03-05: 100 mg via ORAL
  Filled 2013-03-05: qty 1

## 2013-03-05 MED ORDER — CEPHALEXIN 500 MG PO CAPS: 500.0000 mg | ORAL_CAPSULE | Freq: Once | ORAL | Status: DC

## 2013-03-05 MED ORDER — PHENAZOPYRIDINE HCL 200 MG PO TABS: 200.0000 mg | ORAL_TABLET | Freq: Three times a day (TID) | ORAL | Status: DC | PRN

## 2013-03-05 NOTE — ED Provider Notes (Signed)
History     CSN: [id number]  Arrival date & time 03/05/13  9147   First MD Initiated Contact with Patient 03/05/13 1914      Chief Complaint  Patient presents with  . Hematuria    The history is provided by the patient.   patient reports 24 hours of burning with urination and frequency of urination.  She also reports suprapubic abdominal pain.  No nausea or vomiting.  No chills or fever.  No vaginal pain or vaginal discharge.  She did have recent unprotected sex.  She states she gets urinary tract infections recurrently.  She's been seen by urology he states no anatomical abnormalities.  She reports is usually after intercourse.  She had intercourse yesterday.  Her symptoms are mild to moderate in severity.  Nothing worsens or improves her symptoms.  History reviewed. No pertinent past medical history.  History reviewed. No pertinent past surgical history.  History reviewed. No pertinent family history.  History  Substance Use Topics  . Smoking status: Current Every Day Smoker -- 0.04 packs/day for .3 years    Types: Cigarettes  . Smokeless tobacco: Not on file  . Alcohol Use: No    OB History   Grav Para Term Preterm Abortions TAB SAB Ect Mult Living            0      Review of Systems  Genitourinary: Positive for hematuria.  All other systems reviewed and are negative.    Allergies  Penicillins and Sulfa antibiotics  Home Medications   Current Outpatient Rx  Name  Route  Sig  Dispense  Refill  . Norgestimate-Ethinyl Estradiol Triphasic (TRI-SPRINTEC) 0.18/0.215/0.25 MG-35 MCG tablet   Oral   Take 1 tablet by mouth daily.         . phenazopyridine (PYRIDIUM) 200 MG tablet   Oral   Take 200 mg by mouth 3 (three) times daily. 1 po tid with food         . sulfamethoxazole-trimethoprim (BACTRIM DS,SEPTRA DS) 800-160 MG per tablet   Oral   Take 1 tablet by mouth 2 (two) times daily.           BP 110/75  Pulse 123  Temp(Src) 99.3 F (37.4 C) (Oral)   Resp 18  Ht 5\' 2"  (1.575 m)  Wt 130 lb (58.968 kg)  BMI 23.77 kg/m2  SpO2 100%  LMP 03/02/2013  Physical Exam  Nursing note and vitals reviewed. Constitutional: She is oriented to person, place, and time. She appears well-developed and well-nourished. No distress.  HENT:  Head: Normocephalic and atraumatic.  Eyes: EOM are normal.  Neck: Normal range of motion.  Cardiovascular: Normal rate, regular rhythm and normal heart sounds.   Pulmonary/Chest: Effort normal and breath sounds normal.  Abdominal: Soft. She exhibits no distension.  Mild suprapubic tenderness.  No guarding or rebound  Musculoskeletal: Normal range of motion.  Neurological: She is alert and oriented to person, place, and time.  Skin: Skin is warm and dry.  Psychiatric: She has a normal mood and affect. Judgment normal.    ED Course  Procedures (including critical care time)  Labs Reviewed  URINALYSIS, ROUTINE W REFLEX MICROSCOPIC - Abnormal; Notable for the following:    Color, Urine ORANGE (*)    APPearance HAZY (*)    Specific Gravity, Urine >1.030 (*)    Glucose, UA 100 (*)    Hgb urine dipstick LARGE (*)    Bilirubin Urine MODERATE (*)    Ketones, ur  TRACE (*)    Protein, ur >300 (*)    Urobilinogen, UA 4.0 (*)    Nitrite POSITIVE (*)    Leukocytes, UA SMALL (*)    All other components within normal limits  URINE MICROSCOPIC-ADD ON - Abnormal; Notable for the following:    Squamous Epithelial / LPF FEW (*)    Bacteria, UA FEW (*)    All other components within normal limits  URINE CULTURE  PREGNANCY, URINE   No results found.   1. Urinary tract infection       MDM  UTI without signs of pyelonephritis.  Home on Macrobid.  First is the emergency department.  Discharge home in good condition. HR improved to 96 with tx of pain.            Lyanne Co, MD 03/05/13 2153

## 2013-03-05 NOTE — ED Notes (Signed)
Patient c/o burning with urination, frequency, discharge/blood in urine. Per patient "discharge kind of like mucus at first now is bloody." Patient reports having unprotected sex with last intercourse yesterday. Patient reports taking Bactrim but having a allergic reaction (hives).

## 2013-03-05 NOTE — ED Notes (Signed)
Patient reports taking 1 Pyridium today.

## 2013-03-07 LAB — URINE CULTURE: Colony Count: 80000

## 2013-03-08 ENCOUNTER — Encounter: Payer: Self-pay | Admitting: *Deleted

## 2013-03-08 NOTE — ED Notes (Signed)
Post ED Visit - Positive Culture Follow-up  Culture report reviewed by antimicrobial stewardship pharmacist: []  Wes Dulaney, Pharm.D., BCPS [x]  Celedonio Miyamoto, Pharm.D., BCPS []  Georgina Pillion, Pharm.D., BCPS []  [place], [address].D., BCPS, AAHIVP []  Estella Husk, Pharm.D., BCPS, AAHIV  Positive Urine culture Treated with  Nitrofurantoin, organism sensitive to the same and no further patient follow-up is required at this time.  Larena Sox 03/08/2013, 5:17 PM

## 2013-03-11 ENCOUNTER — Encounter: Payer: Self-pay | Admitting: *Deleted

## 2013-03-11 ENCOUNTER — Encounter: Payer: Self-pay | Admitting: Obstetrics and Gynecology

## 2013-03-20 ENCOUNTER — Encounter (HOSPITAL_COMMUNITY): Payer: Self-pay | Admitting: *Deleted

## 2013-03-20 ENCOUNTER — Emergency Department (HOSPITAL_COMMUNITY)
Admission: EM | Admit: 2013-03-20 | Discharge: 2013-03-20 | Disposition: A | Discharge status: Home / Self Care | Payer: Managed Care, Other (non HMO) | Attending: Emergency Medicine | Admitting: Emergency Medicine

## 2013-03-20 DIAGNOSIS — Z3202 Encounter for pregnancy test, result negative: Secondary | ICD-10-CM | POA: Insufficient documentation

## 2013-03-20 DIAGNOSIS — R112 Nausea with vomiting, unspecified: Secondary | ICD-10-CM | POA: Insufficient documentation

## 2013-03-20 DIAGNOSIS — Z87440 Personal history of urinary (tract) infections: Secondary | ICD-10-CM | POA: Insufficient documentation

## 2013-03-20 DIAGNOSIS — G43909 Migraine, unspecified, not intractable, without status migrainosus: Secondary | ICD-10-CM

## 2013-03-20 DIAGNOSIS — Z880 Allergy status to penicillin: Secondary | ICD-10-CM | POA: Insufficient documentation

## 2013-03-20 DIAGNOSIS — R42 Dizziness and giddiness: Secondary | ICD-10-CM | POA: Insufficient documentation

## 2013-03-20 DIAGNOSIS — F17200 Nicotine dependence, unspecified, uncomplicated: Secondary | ICD-10-CM | POA: Insufficient documentation

## 2013-03-20 HISTORY — DX: Urinary tract infection, site not specified: N39.0

## 2013-03-20 LAB — CBC
HCT: 40.3 % (ref 36.0–46.0)
MCHC: 32.3 g/dL (ref 30.0–36.0)
MCV: 86.7 fL (ref 78.0–100.0)
RDW: 14.9 % (ref 11.5–15.5)

## 2013-03-20 LAB — URINALYSIS, ROUTINE W REFLEX MICROSCOPIC
Ketones, ur: 15 mg/dL — AB
Protein, ur: 100 mg/dL — AB
Urobilinogen, UA: 0.2 mg/dL (ref 0.0–1.0)

## 2013-03-20 LAB — BASIC METABOLIC PANEL
BUN: 9 mg/dL (ref 6–23)
Chloride: 101 mEq/L (ref 96–112)
Creatinine, Ser: 0.76 mg/dL (ref 0.50–1.10)
GFR calc Af Amer: >90 mL/min (ref >90)
GFR calc non Af Amer: >90 mL/min (ref >90)

## 2013-03-20 LAB — URINE MICROSCOPIC-ADD ON

## 2013-03-20 MED ORDER — SODIUM CHLORIDE 0.9 % IV SOLN
Freq: Once | INTRAVENOUS | Status: AC
  Administered 2013-03-20: 1000 mL via INTRAVENOUS

## 2013-03-20 MED ORDER — DEXAMETHASONE SODIUM PHOSPHATE 4 MG/ML IJ SOLN
10.0000 mg | Freq: Once | INTRAMUSCULAR | Status: AC
  Administered 2013-03-20: 10 mg via INTRAVENOUS
  Filled 2013-03-20: qty 3

## 2013-03-20 MED ORDER — DIPHENHYDRAMINE HCL 50 MG/ML IJ SOLN
25.0000 mg | Freq: Once | INTRAMUSCULAR | Status: AC
  Administered 2013-03-20: 25 mg via INTRAVENOUS
  Filled 2013-03-20: qty 1

## 2013-03-20 MED ORDER — METOCLOPRAMIDE HCL 5 MG/ML IJ SOLN
10.0000 mg | Freq: Once | INTRAMUSCULAR | Status: AC
  Administered 2013-03-20: 10 mg via INTRAVENOUS
  Filled 2013-03-20: qty 2

## 2013-03-20 NOTE — ED Notes (Signed)
Pt is currently taking doxycyline

## 2013-03-20 NOTE — ED Provider Notes (Signed)
History     CSN: [id number]  Arrival date & time 03/20/13  1803   First MD Initiated Contact with Patient 03/20/13 2118      Chief Complaint  Patient presents with  . Dizziness  . Emesis  . Headache    (Consider location/radiation/quality/duration/timing/severity/associated sxs/prior treatment) Patient is a 22 y.o. female presenting with vomiting and headaches. The history is provided by the patient and medical records. No language interpreter was used.  Emesis Severity:  Severe Duration: Pt had onset of migraine headache, felt in frontal region, starting yesterday.   Timing:  Constant Emesis appearance: She had vomiting and photophobia last night, and the headache persists today. Progression:  Unchanged Chronicity:  New Relieved by:  Nothing Worsened by:  Nothing tried (She took Ibuprofen and Tramadol without relief. ) Associated symptoms: headaches   Associated symptoms: no chills   Headache Associated symptoms: nausea, photophobia and vomiting   Associated symptoms: no fever, no neck pain and no neck stiffness     Past Medical History  Diagnosis Date  . Urinary tract infection     History reviewed. No pertinent past surgical history.  No family history on file.  History  Substance Use Topics  . Smoking status: Current Every Day Smoker -- 0.04 packs/day for .3 years    Types: Cigarettes  . Smokeless tobacco: Not on file  . Alcohol Use: No    OB History   Grav Para Term Preterm Abortions TAB SAB Ect Mult Living            0      Review of Systems  Constitutional: Negative for fever and chills.  HENT: Negative for neck pain and neck stiffness.   Eyes: Positive for photophobia.  Respiratory: Negative.   Cardiovascular: Negative.   Gastrointestinal: Positive for nausea and vomiting.  Genitourinary: Negative.   Musculoskeletal: Negative.   Skin: Negative.   Neurological: Positive for headaches.  Psychiatric/Behavioral: Negative.     Allergies   Penicillins and Sulfa antibiotics  Home Medications   Current Outpatient Rx  Name  Route  Sig  Dispense  Refill  . ibuprofen (ADVIL,MOTRIN) 200 MG tablet   Oral   Take 200 mg by mouth every 6 (six) hours as needed for pain, fever or headache.         . Norgestimate-Ethinyl Estradiol Triphasic (TRI-SPRINTEC) 0.18/0.215/0.25 MG-35 MCG tablet   Oral   Take 1 tablet by mouth daily.           BP 116/79  Pulse 94  Temp(Src) 98.7 F (37.1 C) (Oral)  Resp 20  Ht 5' (1.524 m)  Wt 130 lb (58.968 kg)  BMI 25.39 kg/m2  SpO2 100%  LMP 03/02/2013  Physical Exam  Nursing note and vitals reviewed. Constitutional: She is oriented to person, place, and time. She appears well-developed and well-nourished. Distressed: in moderate to severe distress with headache, resting in a darkened room.  HENT:  Head: Normocephalic and atraumatic.  Right Ear: External ear normal.  Left Ear: External ear normal.  Eyes: Conjunctivae and EOM are normal. Pupils are equal, round, and reactive to light.  Neck: Normal range of motion. Neck supple.  Cardiovascular: Normal rate, regular rhythm and normal heart sounds.   Pulmonary/Chest: Effort normal and breath sounds normal.  Abdominal: Soft. Bowel sounds are normal.  Musculoskeletal: Normal range of motion. She exhibits no edema and no tenderness.  Neurological: She is alert and oriented to person, place, and time.  No sensory or motor deficit.  Skin:  Skin is warm and dry.    ED Course  Procedures (including critical care time)  9:34 PM Pt was seen and had physical examination.  Rx of migraine headache with IV Reglan, dexamethasone, and Benadryl.  10:20 PM Resting comfortably.  Ready for discharge.    1. Migraine headache           Alicia Cooper III, MD 03/20/13 2220

## 2013-03-20 NOTE — ED Notes (Signed)
Pt states no appetite, dizziness, vomiting, shaking, and headache x 2 days. Last vomited 2 hours ago. NAD.

## 2013-03-20 NOTE — ED Notes (Signed)
While standing patient is dizzy.

## 2013-03-20 NOTE — ED Notes (Signed)
While sitting patient states she is dizzy.

## 2013-03-21 LAB — URINE CULTURE

## 2013-05-21 ENCOUNTER — Ambulatory Visit: Payer: Managed Care, Other (non HMO) | Admitting: Urology

## 2013-07-15 ENCOUNTER — Ambulatory Visit (INDEPENDENT_AMBULATORY_CARE_PROVIDER_SITE_OTHER): Payer: Managed Care, Other (non HMO) | Admitting: Adult Health

## 2013-07-15 VITALS — BP 128/70 | Ht 62.0 in | Wt 114.4 lb

## 2013-07-15 DIAGNOSIS — Z3202 Encounter for pregnancy test, result negative: Secondary | ICD-10-CM

## 2013-07-15 DIAGNOSIS — Z3201 Encounter for pregnancy test, result positive: Secondary | ICD-10-CM

## 2013-07-15 LAB — POCT URINE PREGNANCY: Preg Test, Ur: POSITIVE

## 2013-07-17 ENCOUNTER — Telehealth: Payer: Self-pay | Admitting: Adult Health

## 2013-07-17 NOTE — Telephone Encounter (Signed)
Pt c/o cough and runny nose. Pt states about [redacted] weeks pregnant. Pt encouraged to push fluids, juices high vit C, rest, gargle with warm salt water sore throat, tylenol for aches and pains. If no improvement pt to call office back. Pt verbalized understanding.

## 2013-07-19 ENCOUNTER — Other Ambulatory Visit: Payer: Self-pay | Admitting: Obstetrics & Gynecology

## 2013-07-19 DIAGNOSIS — O3680X0 Pregnancy with inconclusive fetal viability, not applicable or unspecified: Secondary | ICD-10-CM

## 2013-07-24 ENCOUNTER — Ambulatory Visit (INDEPENDENT_AMBULATORY_CARE_PROVIDER_SITE_OTHER): Payer: Managed Care, Other (non HMO)

## 2013-07-24 DIAGNOSIS — O3680X0 Pregnancy with inconclusive fetal viability, not applicable or unspecified: Secondary | ICD-10-CM

## 2013-07-24 DIAGNOSIS — O26849 Uterine size-date discrepancy, unspecified trimester: Secondary | ICD-10-CM

## 2013-07-24 NOTE — Progress Notes (Signed)
U/S-single IUP with +FCA noted, FHR-106 bpm, cx long and closed, bilateral adnexa wnl with C.L. Noted on RT, CRL c/w 5+5wks, EDD 03/21/2014, no free fluid noted within pelvis

## 2013-07-31 ENCOUNTER — Encounter: Payer: Self-pay | Admitting: Advanced Practice Midwife

## 2013-07-31 ENCOUNTER — Encounter (INDEPENDENT_AMBULATORY_CARE_PROVIDER_SITE_OTHER): Payer: Self-pay

## 2013-07-31 ENCOUNTER — Ambulatory Visit (INDEPENDENT_AMBULATORY_CARE_PROVIDER_SITE_OTHER): Payer: Managed Care, Other (non HMO) | Admitting: Advanced Practice Midwife

## 2013-07-31 VITALS — BP 100/60 | Wt 123.0 lb

## 2013-07-31 DIAGNOSIS — Z3400 Encounter for supervision of normal first pregnancy, unspecified trimester: Secondary | ICD-10-CM | POA: Insufficient documentation

## 2013-07-31 DIAGNOSIS — Z3490 Encounter for supervision of normal pregnancy, unspecified, unspecified trimester: Secondary | ICD-10-CM

## 2013-07-31 DIAGNOSIS — O99019 Anemia complicating pregnancy, unspecified trimester: Secondary | ICD-10-CM

## 2013-07-31 DIAGNOSIS — Z3401 Encounter for supervision of normal first pregnancy, first trimester: Secondary | ICD-10-CM

## 2013-07-31 DIAGNOSIS — Z23 Encounter for immunization: Secondary | ICD-10-CM

## 2013-07-31 DIAGNOSIS — Z331 Pregnant state, incidental: Secondary | ICD-10-CM

## 2013-07-31 DIAGNOSIS — Z1389 Encounter for screening for other disorder: Secondary | ICD-10-CM

## 2013-07-31 LAB — POCT URINALYSIS DIPSTICK
Blood, UA: NEGATIVE
Ketones, UA: NEGATIVE
Protein, UA: NEGATIVE

## 2013-07-31 MED ORDER — INFLUENZA VAC SPLIT QUAD 0.5 ML IM SUSP
0.5000 mL | Freq: Once | INTRAMUSCULAR | Status: AC
  Administered 2013-07-31: 0.5 mL via INTRAMUSCULAR

## 2013-07-31 NOTE — Progress Notes (Signed)
New OB packet given. Consents signed. Pt states she had a pap at [facility] within the last year. To sign a record release.   Subjective:    Alicia [name] is a G1P0 [redacted]w[redacted]d being seen today for her first obstetrical visit.  Her obstetrical history is significant for nothing.  Pregnancy history fully reviewed.  Patient reports fatigue and nausea.  Filed Vitals:   07/31/13 1109  BP: 100/60  Weight: 123 lb (55.792 kg)    HISTORY: OB History  Gravida Para Term Preterm AB SAB TAB Ectopic Multiple Living  1         0    # Outcome Date GA Lbr Len/2nd Weight Sex Delivery Anes PTL Lv  1 CUR              Past Medical History  Diagnosis Date  . Urinary tract infection    Past Surgical History  Procedure Laterality Date  . No past surgeries     Family History  Problem Relation Age of Onset  . Hypertension Mother   . Anemia Mother   . Cancer Maternal Grandmother     breast     Exam                                           Skin: normal coloration and turgor, no rashes    Neurologic: oriented, normal, normal mood   Extremities: normal strength, tone, and muscle mass   HEENT PERRLA   Mouth/Teeth mucous membranes moist, pharynx normal without lesions   Neck supple and no masses   Cardiovascular: regular rate and rhythm   Respiratory:  appears well, vitals normal, no respiratory distress, acyanotic, normal RR   Abdomen: soft, non-tender; bowel sounds normal; no masses,  no organomegaly          Assessment:    Pregnancy: G1P0 Patient Active Problem List   Diagnosis Date Noted  . Pregnant 07/31/2013        Plan:    dICLEGIS SAMPLES GIVEN Initial labs drawn. Prenatal vitamins. Problem list reviewed and updated. Genetic Screening discussed Integrated Screen: requested.  Ultrasound discussed; fetal survey: requested.  Follow up in 3 weeks.  CRESENZO-DISHMAN,FRANCES 07/31/2013

## 2013-08-01 LAB — CBC
HCT: 36.4 % (ref 36.0–46.0)
Hemoglobin: 11.7 g/dL — ABNORMAL LOW (ref 12.0–15.0)
MCH: 27 pg (ref 26.0–34.0)
MCHC: 32.1 g/dL (ref 30.0–36.0)
MCV: 84.1 fL (ref 78.0–100.0)

## 2013-08-01 LAB — URINALYSIS
Bilirubin Urine: NEGATIVE
Ketones, ur: NEGATIVE mg/dL
Specific Gravity, Urine: 1.022 (ref 1.005–1.030)
pH: 7 (ref 5.0–8.0)

## 2013-08-01 LAB — OXYCODONE SCREEN, UA, RFLX CONFIRM: Oxycodone Screen, Ur: NEGATIVE ng/mL

## 2013-08-01 LAB — VARICELLA ZOSTER ANTIBODY, IGG: Varicella IgG: 2580 Index — ABNORMAL HIGH (ref <135.00)

## 2013-08-01 LAB — DRUG SCREEN, URINE, NO CONFIRMATION
Amphetamine Screen, Ur: NEGATIVE
Benzodiazepines.: NEGATIVE
Cocaine Metabolites: NEGATIVE
Methadone: NEGATIVE

## 2013-08-01 LAB — HIV ANTIBODY (ROUTINE TESTING W REFLEX): HIV: NONREACTIVE

## 2013-08-01 LAB — ABO AND RH: Rh Type: POSITIVE

## 2013-08-02 ENCOUNTER — Telehealth: Payer: Self-pay | Admitting: Adult Health

## 2013-08-02 LAB — CYSTIC FIBROSIS DIAGNOSTIC STUDY

## 2013-08-02 LAB — URINE CULTURE: Organism ID, Bacteria: NO GROWTH

## 2013-08-02 LAB — GC/CHLAMYDIA PROBE AMP: GC Probe RNA: NEGATIVE

## 2013-08-02 MED ORDER — AZITHROMYCIN 250 MG PO TABS: 500.0000 mg | ORAL_TABLET | Freq: Once | ORAL | Status: DC

## 2013-08-02 NOTE — Telephone Encounter (Deleted)
Pt aware +chlamyida will treat with azithromycin 500 mg #2 2 po now, partner in jail, no sex do POT in 4 weeks

## 2013-08-02 NOTE — Telephone Encounter (Signed)
Pt aware of +chlamyida treated with azithromycin 500 mg 2 po now no sex POT in 4 weeks partner in jail

## 2013-08-12 ENCOUNTER — Telehealth: Payer: Self-pay | Admitting: Obstetrics and Gynecology

## 2013-08-12 ENCOUNTER — Encounter (HOSPITAL_COMMUNITY): Payer: Self-pay | Admitting: Emergency Medicine

## 2013-08-12 ENCOUNTER — Emergency Department (HOSPITAL_COMMUNITY)
Admission: EM | Admit: 2013-08-12 | Discharge: 2013-08-12 | Disposition: A | Discharge status: Home / Self Care | Payer: Managed Care, Other (non HMO) | Attending: Emergency Medicine | Admitting: Emergency Medicine

## 2013-08-12 DIAGNOSIS — Z3490 Encounter for supervision of normal pregnancy, unspecified, unspecified trimester: Secondary | ICD-10-CM

## 2013-08-12 DIAGNOSIS — Z87891 Personal history of nicotine dependence: Secondary | ICD-10-CM | POA: Insufficient documentation

## 2013-08-12 DIAGNOSIS — Z87440 Personal history of urinary (tract) infections: Secondary | ICD-10-CM | POA: Insufficient documentation

## 2013-08-12 DIAGNOSIS — O9989 Other specified diseases and conditions complicating pregnancy, childbirth and the puerperium: Secondary | ICD-10-CM | POA: Insufficient documentation

## 2013-08-12 DIAGNOSIS — Z880 Allergy status to penicillin: Secondary | ICD-10-CM | POA: Insufficient documentation

## 2013-08-12 DIAGNOSIS — R55 Syncope and collapse: Secondary | ICD-10-CM | POA: Insufficient documentation

## 2013-08-12 DIAGNOSIS — R5381 Other malaise: Secondary | ICD-10-CM | POA: Insufficient documentation

## 2013-08-12 DIAGNOSIS — R404 Transient alteration of awareness: Secondary | ICD-10-CM | POA: Insufficient documentation

## 2013-08-12 LAB — CBC WITH DIFFERENTIAL/PLATELET
Eosinophils Relative: 2 % (ref 0–5)
Lymphocytes Relative: 19 % (ref 12–46)
Lymphs Abs: 1.6 10*3/uL (ref 0.7–4.0)
MCV: 83.4 fL (ref 78.0–100.0)
Neutro Abs: 5.7 10*3/uL (ref 1.7–7.7)
Neutrophils Relative %: 70 % (ref 43–77)
Platelets: 240 10*3/uL (ref 150–400)
RBC: 4.34 MIL/uL (ref 3.87–5.11)
WBC: 8.2 10*3/uL (ref 4.0–10.5)

## 2013-08-12 LAB — BASIC METABOLIC PANEL
CO2: 24 mEq/L (ref 19–32)
Calcium: 9.7 mg/dL (ref 8.4–10.5)
Sodium: 136 mEq/L (ref 135–145)

## 2013-08-12 MED ORDER — SODIUM CHLORIDE 0.9 % IV BOLUS (SEPSIS)
1000.0000 mL | Freq: Once | INTRAVENOUS | Status: AC
  Administered 2013-08-12: 1000 mL via INTRAVENOUS

## 2013-08-12 NOTE — ED Notes (Signed)
Resting in bed. nad noted. Family at bsd.

## 2013-08-12 NOTE — ED Provider Notes (Signed)
CSN: [id number]     Arrival date & time 08/12/13  0826 History   This chart was scribed for Alicia Hutching, MD by Bennett Scrape, ED Scribe. This patient was seen in room APA02/APA02 and the patient's care was started at 9:09 AM.   Chief Complaint  Patient presents with  . Loss of Consciousness    The history is provided by the patient. No language interpreter was used.   HPI Comments: Alicia [name] is a 22 y.o. female who is currently [redacted] weeks pregnant presents to the Emergency Department by EMS complaining of one episode of near syncope that occurred this morning. Pt states that she was working as a Conservation officer, nature at [employer] when she felt hot, developed palpitations described as fluttering and became lightheaded. She was then helped into a chair by coworkers and denies falling to the ground or injuries. CBG was 88 en route. Vitals were stable. She states that she currently feels fatigued but states that the palpitations and lightheadedness has since resolved. She denies eating breakfast or drinking any fluids today. She denies any chronic medical problems. She has been following up with her OB-GYN for the pregnancy, last visit was one week ago for routine blood work. Next appointment is on 08/21/13.    OB-GYN is Dr. Despina Hidden   Past Medical History  Diagnosis Date  . Urinary tract infection    Past Surgical History  Procedure Laterality Date  . No past surgeries     Family History  Problem Relation Age of Onset  . Hypertension Mother   . Anemia Mother   . Cancer Maternal Grandmother     breast   History  Substance Use Topics  . Smoking status: Former Smoker -- 0.04 packs/day for .3 years    Types: Cigarettes  . Smokeless tobacco: Never Used  . Alcohol Use: No   OB History   Grav Para Term Preterm Abortions TAB SAB Ect Mult Living   1         0     Review of Systems  A complete 10 system review of systems was obtained and all systems are negative except as noted in the HPI and PMH.    Allergies  Penicillins and Sulfa antibiotics  Home Medications   Current Outpatient Rx  Name  Route  Sig  Dispense  Refill  . azithromycin (ZITHROMAX) 250 MG tablet   Oral   Take 2 tablets (500 mg total) by mouth once.   2 tablet   0   . Pediatric Multiple Vit-C-FA (FLINSTONES GUMMIES OMEGA-3 DHA) CHEW   Oral   Chew by mouth. Takes 2 daily          Triage Vitals: BP 108/76  Pulse 111  Temp(Src) 98.1 F (36.7 C) (Oral)  Resp 16  Ht 5\' 2"  (1.575 m)  Wt 115 lb (52.164 kg)  BMI 21.03 kg/m2  SpO2 100%  LMP 06/08/2013  Physical Exam  Nursing note and vitals reviewed. Constitutional: She is oriented to person, place, and time. She appears well-developed and well-nourished.  HENT:  Head: Normocephalic and atraumatic.  Eyes: Conjunctivae and EOM are normal. Pupils are equal, round, and reactive to light.  Neck: Normal range of motion. Neck supple.  Cardiovascular: Normal rate, regular rhythm and normal heart sounds.   Pulmonary/Chest: Effort normal and breath sounds normal.  Abdominal: Soft. Bowel sounds are normal.  Musculoskeletal: Normal range of motion.  Neurological: She is alert and oriented to person, place, and time.  Skin: Skin  is warm and dry.  Psychiatric: She has a normal mood and affect.    ED Course  Procedures (including critical care time)  Medications  sodium chloride 0.9 % bolus 1,000 mL (0 mLs Intravenous Stopped 08/12/13 1045)  sodium chloride 0.9 % bolus 1,000 mL (1,000 mLs Intravenous New Bag/Given 08/12/13 1046)    DIAGNOSTIC STUDIES: Oxygen Saturation is 100% on room air, normal by my interpretation.    COORDINATION OF CARE: 9:13 AM-Advised pt that she needs to eat and drink in the morning especially while pregnant. Discussed treatment plan which includes IV fluids, CBC panel, BMP and ortho vitals with pt at bedside and pt agreed to plan.   Labs Review Labs Reviewed  CBC WITH DIFFERENTIAL - Abnormal; Notable for the following:    RDW  15.6 (*)    All other components within normal limits  BASIC METABOLIC PANEL   Imaging Review No results found.  EKG Interpretation     Ventricular Rate:  93 PR Interval:  136 QRS Duration: 88 QT Interval:  350 QTC Calculation: 435 R Axis:   73 Text Interpretation:  Normal sinus rhythm Normal ECG When compared with ECG of 02-Dec-2010 13:01, No significant change was found            MDM  No diagnosis found. Patient is hemodynamically stable. She did not eat or drink this morning. Suspect hypovolemia. Hemoglobin and EKG normal.  No abdominal pain, vaginal bleeding to suggest ectopic pregnancy  I personally performed the services described in this documentation, which was scribed in my presence. The recorded information has been reviewed and is accurate.    Alicia Hutching, MD 08/12/13 1340

## 2013-08-12 NOTE — ED Notes (Signed)
Pt up and ambulated to bathroom without difficulty

## 2013-08-12 NOTE — Telephone Encounter (Signed)
Pt states passed out at work today, went to [facility] today. Pt states has not been eating and was told from [facility] to start eating. No bleeding, c/o nausea and vomiting. Call transferred to front staff for an appt to be made with provider tomorrow.

## 2013-08-12 NOTE — ED Notes (Signed)
Pt is [redacted] weeks pregnant. States she had a syncopal episode while at work at [employer]. Pt did not fall to the ground, she was helped into a chair by coworkers. Same thing occurred a 1 1/2 ago. CBG en route was 88. NAD.

## 2013-08-12 NOTE — ED Notes (Signed)
Dr. Adriana Simas aware pt ready to be dc home.

## 2013-08-13 ENCOUNTER — Ambulatory Visit (INDEPENDENT_AMBULATORY_CARE_PROVIDER_SITE_OTHER): Payer: Managed Care, Other (non HMO) | Admitting: Obstetrics & Gynecology

## 2013-08-13 VITALS — BP 108/60 | Wt 121.0 lb

## 2013-08-13 DIAGNOSIS — O210 Mild hyperemesis gravidarum: Secondary | ICD-10-CM

## 2013-08-13 DIAGNOSIS — Z331 Pregnant state, incidental: Secondary | ICD-10-CM

## 2013-08-13 DIAGNOSIS — Z1389 Encounter for screening for other disorder: Secondary | ICD-10-CM

## 2013-08-13 LAB — POCT URINALYSIS DIPSTICK
Glucose, UA: NEGATIVE
Ketones, UA: NEGATIVE
Leukocytes, UA: NEGATIVE

## 2013-08-13 MED ORDER — OMEPRAZOLE 20 MG PO CPDR: 20.0000 mg | DELAYED_RELEASE_CAPSULE | Freq: Every day | ORAL | Status: DC

## 2013-08-13 NOTE — Progress Notes (Signed)
Pt seen at [facility] due to episodes of "passing out at work" was told at ER pt needed to be eating regularly. Pt states feels like she has a lot of drainage in back of throat causing her to have nausea and vomiting.

## 2013-08-13 NOTE — Progress Notes (Signed)
Patient reports that she "passed out" at work yesterday.  Prior to passing out, she felt "hot" and her heart was "fluttering."  Patient was taken to the ED.  Work up was negative and patient was discharge home.  She currently denies and vaginal bleeding or discharge.  She does complain of drainage sensation in the throat after eating and N/V. Patient reassured today.  "Drainage" likely from GERD. Will treat with Omeprazole.

## 2013-08-21 ENCOUNTER — Encounter: Payer: Self-pay | Admitting: Obstetrics and Gynecology

## 2013-08-21 ENCOUNTER — Ambulatory Visit (INDEPENDENT_AMBULATORY_CARE_PROVIDER_SITE_OTHER): Payer: Managed Care, Other (non HMO) | Admitting: Obstetrics and Gynecology

## 2013-08-21 VITALS — BP 120/80 | Wt 119.0 lb

## 2013-08-21 DIAGNOSIS — O99019 Anemia complicating pregnancy, unspecified trimester: Secondary | ICD-10-CM

## 2013-08-21 DIAGNOSIS — A749 Chlamydial infection, unspecified: Secondary | ICD-10-CM | POA: Insufficient documentation

## 2013-08-21 DIAGNOSIS — Z3401 Encounter for supervision of normal first pregnancy, first trimester: Secondary | ICD-10-CM

## 2013-08-21 DIAGNOSIS — O98319 Other infections with a predominantly sexual mode of transmission complicating pregnancy, unspecified trimester: Secondary | ICD-10-CM

## 2013-08-21 DIAGNOSIS — A7489 Other chlamydial diseases: Secondary | ICD-10-CM

## 2013-08-21 DIAGNOSIS — Z1389 Encounter for screening for other disorder: Secondary | ICD-10-CM

## 2013-08-21 LAB — POCT URINALYSIS DIPSTICK
Glucose, UA: NEGATIVE
Leukocytes, UA: NEGATIVE
Nitrite, UA: NEGATIVE

## 2013-08-21 NOTE — Progress Notes (Signed)
Urine sent to lab for POT. Pt denies any problems or concerns at this time.

## 2013-08-21 NOTE — Progress Notes (Signed)
+  chlamydia infxn discussed. Partner incarcerated. (DUI/license revoked) til Jan. Pt given info

## 2013-08-21 NOTE — Patient Instructions (Signed)
Chlamydia, Females and Males Chlamydia is an infection that can be found in the vagina, urethra, cervix, rectum and pelvic organs in the female. In the female, it most often causes urethritis. This happens when it infects the tube (urethra) that carries the urine out of the bladder. When Chlamydia causes urethritis, there may be burning with urination. In males, it may also infect the tubes that carry the sperm from the testicle. This causes pain in the testicles and infect the prostate gland. In females, an infection of the pelvic organs is also called PID (pelvic inflammatory disease). PID may be a cause of sudden (acute) lower abdominal/belly (pelvic) pain and fever. But with Chlamydia, the infection sometimes does not cause problems that you notice (asymptomatic). It may cause an abnormal or watery mucous-like discharge from the birth canal (vagina) or penis.  CAUSES  Chlamydia is caused by germs (bacteria) that are spread during sexual contact of the:  Genitals.  Mouth.  Rectum. This infection may also be passed to a newborn baby coming through the infected birth canal. This causes eye and lung infections in the baby. Chlamydia often goes unnoticed. So it is easy to transmit it to a sexual partner without even knowing. SYMPTOMS  In females, symptoms may go unnoticed. Symptoms that are more noticeable can include:  Belly (abdominal) pain.  Painful intercourse.  Watery mucous-like discharge from the vagina.  Miscarriage.  Discomfort when urinating.  Inflammation of the rectum. In males, symptoms include:  Burning with urination.  Pain in the testicles.  Watery mucous-like discharge from the penis. It can cause longstanding (chronic) pelvic pain after frequent infections. TREATMENT   Chlamydia can be treated with medications which kill germs(antibiotics).  Inform all sexual partners about the infection. All sexual contacts need to be treated.  If you are pregnant, do not take  tetracycline type antibiotics.  PID can cause women to not be able to have children (sterile) if left untreated or if half-treated. It does this by scarring the tubes to the uterus (fallopian tubes). They carry the egg needed to form a baby. It is important to finish ALL medications given to you.  Sterility or future tubal (ectopic) pregnancies can occur in fully treated individuals. It is important to follow your prescribed treatment. That will lessen the chances of these problems.  This is a sexually transmitted infection. So you are also at risk for other sexually transmitted diseases. These include: Gonorrhea and HIV (AIDS). Testing may be done for the other sexually transmitted diseases if one disease is detected.  It is important to treat chlamydia as soon as possible. It can cause damage to other organs. HOME CARE INSTRUCTIONS  Finish all medication as prescribed. Incomplete treatment will put you at risk for not being able to have children (sterility) and tubal pregnancy. If one sexually transmitted disease is discovered, often treatment will be started to cover other possible infections.  Only take over-the-counter or prescription medicines for pain, discomfort, or fever as directed by your caregiver.  Rest.  Eat a balanced diet and drink plenty of fluids.  Warning: This infection is contagious. Do not have sex until treatment is completed. Follow up at your caregiver's office or the clinic to which you were referred. If your diagnosis (learning what is wrong) is confirmed by culture or some other method, your recent sexual contacts need treatment. Even if they are symptom free or have a negative culture or evaluation, they should be treated.  For the protection of your privacy,   or some other method, your recent sexual contacts need treatment. Even if they are symptom free or have a negative culture or evaluation, they should be treated.   For the protection of your privacy, test results can not be given over the phone. Make sure you receive the results of your test. Ask how these results are to be obtained if you have not been informed. It is your responsibility to obtain your test  results.  PREVENTION    Women should use sanitary pads instead of tampons for vaginal discharge.   Wipe front to back after using the toilet and avoid douching.   Test for chlamydia if you are having an IUD inserted.   Practice safe sex, use condoms, have only one sex partner and be sure your sex partner is not having sex with others.   Ask your caregiver to test you for chlamydia at your regular checkups or sooner if you are having symptoms.   Ask for further information if you are pregnant.  SEEK IMMEDIATE MEDICAL CARE IF:    You develop an oral temperature above 102 F (38.9 C), not controlled by medications or lasting more than 2 days.   You develop an increase in pain.   You develop any type of abnormal discharge.   You develop vaginal bleeding and it is not time for your period.   You develop painful intercourse.  MAKE SURE YOU:    Understand these instructions.   Will watch your condition.   Will get help right away if you are not doing well or get worse.  Document Released: 10/10/2005 Document Revised: 01/02/2012 Document Reviewed: 05/15/2008  [employer] Patient Information 2014 [employer].

## 2013-09-18 ENCOUNTER — Other Ambulatory Visit: Payer: Self-pay | Admitting: Obstetrics & Gynecology

## 2013-09-18 ENCOUNTER — Other Ambulatory Visit: Payer: Managed Care, Other (non HMO)

## 2013-09-18 ENCOUNTER — Ambulatory Visit (INDEPENDENT_AMBULATORY_CARE_PROVIDER_SITE_OTHER): Payer: Managed Care, Other (non HMO) | Admitting: Advanced Practice Midwife

## 2013-09-18 ENCOUNTER — Encounter: Payer: Self-pay | Admitting: Advanced Practice Midwife

## 2013-09-18 ENCOUNTER — Ambulatory Visit (INDEPENDENT_AMBULATORY_CARE_PROVIDER_SITE_OTHER): Payer: Managed Care, Other (non HMO)

## 2013-09-18 ENCOUNTER — Other Ambulatory Visit: Payer: Self-pay | Admitting: Advanced Practice Midwife

## 2013-09-18 VITALS — BP 120/62 | Wt 123.0 lb

## 2013-09-18 DIAGNOSIS — O99019 Anemia complicating pregnancy, unspecified trimester: Secondary | ICD-10-CM

## 2013-09-18 DIAGNOSIS — Z36 Encounter for antenatal screening of mother: Secondary | ICD-10-CM

## 2013-09-18 DIAGNOSIS — O98319 Other infections with a predominantly sexual mode of transmission complicating pregnancy, unspecified trimester: Secondary | ICD-10-CM

## 2013-09-18 LAB — POCT URINALYSIS DIPSTICK
Blood, UA: NEGATIVE
Glucose, UA: NEGATIVE
Nitrite, UA: NEGATIVE
Protein, UA: NEGATIVE

## 2013-09-18 NOTE — Progress Notes (Signed)
Will POC for + CHL.  FOB [facility] in [facility].  Has a white d/c.  SSE:  Normal.    No c/o at this time.  Routine questions about pregnancy answered.  F/U in 4 weeks for 2nd IT/LROB.

## 2013-09-18 NOTE — Progress Notes (Signed)
U/S(13+5wks)-single IUP with +FCA noted, FHR-159 bpm, cx long and closed (3.4cm), bilateral adnexa WNL, NB present, NT-1.18mm, CRL c/w dates

## 2013-09-18 NOTE — Addendum Note (Signed)
Addended by: Jacklyn Shell on: 09/18/2013 12:16 PM   Modules accepted: Orders

## 2013-09-19 LAB — GC/CHLAMYDIA PROBE AMP
CT Probe RNA: NEGATIVE
GC Probe RNA: NEGATIVE

## 2013-10-15 ENCOUNTER — Ambulatory Visit (INDEPENDENT_AMBULATORY_CARE_PROVIDER_SITE_OTHER): Payer: Managed Care, Other (non HMO) | Admitting: Women's Health

## 2013-10-15 ENCOUNTER — Other Ambulatory Visit: Payer: Self-pay | Admitting: Women's Health

## 2013-10-15 VITALS — BP 100/58 | Wt 126.2 lb

## 2013-10-15 DIAGNOSIS — Z3402 Encounter for supervision of normal first pregnancy, second trimester: Secondary | ICD-10-CM

## 2013-10-15 DIAGNOSIS — F1290 Cannabis use, unspecified, uncomplicated: Secondary | ICD-10-CM | POA: Insufficient documentation

## 2013-10-15 DIAGNOSIS — A749 Chlamydial infection, unspecified: Secondary | ICD-10-CM

## 2013-10-15 DIAGNOSIS — Z331 Pregnant state, incidental: Secondary | ICD-10-CM

## 2013-10-15 DIAGNOSIS — Z1389 Encounter for screening for other disorder: Secondary | ICD-10-CM

## 2013-10-15 DIAGNOSIS — O98319 Other infections with a predominantly sexual mode of transmission complicating pregnancy, unspecified trimester: Secondary | ICD-10-CM

## 2013-10-15 DIAGNOSIS — O99019 Anemia complicating pregnancy, unspecified trimester: Secondary | ICD-10-CM

## 2013-10-15 LAB — POCT URINALYSIS DIPSTICK
Glucose, UA: NEGATIVE
Ketones, UA: NEGATIVE
Leukocytes, UA: NEGATIVE
Nitrite, UA: NEGATIVE

## 2013-10-15 NOTE — Progress Notes (Signed)
Reports good fm. Denies uc's, lof, vb, urinary frequency, urgency, hesitancy, or dysuria.  No complaints.  Reviewed warning s/s to report.  All questions answered. F/U in 3wks for anatomy u/s and visit.  2nd IT today.

## 2013-10-15 NOTE — Patient Instructions (Signed)
[place] Pediatricians:  [facility] [phone]            [facility] [phone]                 [facility] [phone] (usually doesn't accept new patients unless you have family there already, you are always welcome to call and ask)             [facility] ([address]) [phone]   [facility] Pediatricians:   [facility]: [phone]  [facility]: [phone]   Second Trimester of Pregnancy The second trimester is from week 13 through week 28, months 4 through 6. The second trimester is often a time when you feel your best. Your body has also adjusted to being pregnant, and you begin to feel better physically. Usually, morning sickness has lessened or quit completely, you may have more energy, and you may have an increase in appetite. The second trimester is also a time when the fetus is growing rapidly. At the end of the sixth month, the fetus is about 9 inches long and weighs about 1 pounds. You will likely begin to feel the baby move (quickening) between 18 and 20 weeks of the pregnancy. BODY CHANGES Your body goes through many changes during pregnancy. The changes vary from woman to woman.   Your weight will continue to increase. You will notice your lower abdomen bulging out.  You may begin to get stretch marks on your hips, abdomen, and breasts.  You may develop headaches that can be relieved by medicines approved by your caregiver.  You may urinate more often because the fetus is pressing on your bladder.  You may develop or continue to have heartburn as a result of your pregnancy.  You may develop constipation because certain hormones are causing the muscles that push waste through your intestines to slow down.  You may develop hemorrhoids or swollen, bulging veins (varicose veins).  You may have back pain because of the weight gain and pregnancy  hormones relaxing your joints between the bones in your pelvis and as a result of a shift in weight and the muscles that support your balance.  Your breasts will continue to grow and be tender.  Your gums may bleed and may be sensitive to brushing and flossing.  Dark spots or blotches (chloasma, mask of pregnancy) may develop on your face. This will likely fade after the baby is born.  A dark line from your belly button to the pubic area (linea nigra) may appear. This will likely fade after the baby is born. WHAT TO EXPECT AT YOUR PRENATAL VISITS During a routine prenatal visit:  You will be weighed to make sure you and the fetus are growing normally.  Your blood pressure will be taken.  Your abdomen will be measured to track your baby's growth.  The fetal heartbeat will be listened to.  Any test results from the previous visit will be discussed. Your caregiver may ask you:  How you are feeling.  If you are feeling the baby move.  If you have had any abnormal symptoms, such as leaking fluid, bleeding, severe headaches, or abdominal cramping.  If you have any questions. Other tests that may be performed during your second trimester include:  Blood tests that check for:  Low iron levels (anemia).  Gestational diabetes (between 24 and 28 weeks).  Rh antibodies.  Urine tests to check for infections, diabetes, or protein  in the urine.  An ultrasound to confirm the proper growth and development of the baby.  An amniocentesis to check for possible genetic problems.  Fetal screens for spina bifida and Down syndrome. HOME CARE INSTRUCTIONS   Avoid all smoking, herbs, alcohol, and unprescribed drugs. These chemicals affect the formation and growth of the baby.  Follow your caregiver's instructions regarding medicine use. There are medicines that are either safe or unsafe to take during pregnancy.  Exercise only as directed by your caregiver. Experiencing uterine cramps is a  good sign to stop exercising.  Continue to eat regular, healthy meals.  Wear a good support bra for breast tenderness.  Do not use hot tubs, steam rooms, or saunas.  Wear your seat belt at all times when driving.  Avoid raw meat, uncooked cheese, cat litter boxes, and soil used by cats. These carry germs that can cause birth defects in the baby.  Take your prenatal vitamins.  Try taking a stool softener (if your caregiver approves) if you develop constipation. Eat more high-fiber foods, such as fresh vegetables or fruit and whole grains. Drink plenty of fluids to keep your urine clear or pale yellow.  Take warm sitz baths to soothe any pain or discomfort caused by hemorrhoids. Use hemorrhoid cream if your caregiver approves.  If you develop varicose veins, wear support hose. Elevate your feet for 15 minutes, 3 4 times a day. Limit salt in your diet.  Avoid heavy lifting, wear low heel shoes, and practice good posture.  Rest with your legs elevated if you have leg cramps or low back pain.  Visit your dentist if you have not gone yet during your pregnancy. Use a soft toothbrush to brush your teeth and be gentle when you floss.  A sexual relationship may be continued unless your caregiver directs you otherwise.  Continue to go to all your prenatal visits as directed by your caregiver. SEEK MEDICAL CARE IF:   You have dizziness.  You have mild pelvic cramps, pelvic pressure, or nagging pain in the abdominal area.  You have persistent nausea, vomiting, or diarrhea.  You have a bad smelling vaginal discharge.  You have pain with urination. SEEK IMMEDIATE MEDICAL CARE IF:   You have a fever.  You are leaking fluid from your vagina.  You have spotting or bleeding from your vagina.  You have severe abdominal cramping or pain.  You have rapid weight gain or loss.  You have shortness of breath with chest pain.  You notice sudden or extreme swelling of your face, hands,  ankles, feet, or legs.  You have not felt your baby move in over an hour.  You have severe headaches that do not go away with medicine.  You have vision changes. Document Released: 10/04/2001 Document Revised: 06/12/2013 Document Reviewed: 12/11/2012 [facility] Patient Information 2014 [place], [place].

## 2013-10-16 LAB — DRUG SCREEN, URINE, NO CONFIRMATION
Amphetamine Screen, Ur: NEGATIVE
Benzodiazepines.: NEGATIVE
Cocaine Metabolites: NEGATIVE
Creatinine,U: 195.4 mg/dL
Marijuana Metabolite: NEGATIVE
Methadone: NEGATIVE

## 2013-10-19 ENCOUNTER — Encounter: Payer: Self-pay | Admitting: Women's Health

## 2013-10-22 LAB — MATERNAL SCREEN, INTEGRATED #2
Calculated Gestational Age: 17.3
Estriol Mom: 1.67
Inhibin A Dimeric: 106 pg/mL
Inhibin A MoM: 0.58
MSS Down Syndrome: <1:5000 {titer}
MSS Trisomy 18 Risk: <1:5000 {titer}
NT MoM: 0.95
Number of fetuses: 1
PAPP-A MoM: 1.56
Rish for ONTD: <1:5000 {titer}
hCG, Serum: 18.9 IU/mL

## 2013-10-24 NOTE — L&D Delivery Note (Signed)
`````  Attestation of Attending Supervision of Advanced Practitioner: Evaluation and management procedures were performed by the PA/NP/CNM/OB Fellow under my supervision/collaboration. Chart reviewed and agree with management and plan.  Jonnie Kind 03/09/2014 1:47 PM

## 2013-10-24 NOTE — L&D Delivery Note (Signed)
Delivery Note At 5:37 PM a viable female was delivered via Vaginal, Spontaneous Delivery (Presentation: Right Occiput Anterior), w/ compound posterior hand and snug Rt leg/foot cord. Infant placed directly on mom's abdomen for bonding/skin-to-skin. Cord allowed to stop pulsing, and was clamped x 2, and cut by fob.   APGAR: 8, 9; weight: pending.   Placenta status: Intact, Spontaneous.  Cord: 3 vessels with the following complications:  none  Anesthesia: Epidural  Episiotomy: n/a Lacerations: bilateral sulcus Suture Repair: 3.0 vicryl Est. Blood Loss (mL): 400  Mom to postpartum.  Baby to Couplet care / Skin to Skin. Plans to bottlefeed Depo for contraception Circ @ [facility]  [facility] 03/08/2014, 6:17 PM

## 2013-10-28 ENCOUNTER — Encounter: Payer: Self-pay | Admitting: Women's Health

## 2013-11-06 ENCOUNTER — Other Ambulatory Visit: Payer: Self-pay | Admitting: Women's Health

## 2013-11-06 ENCOUNTER — Ambulatory Visit (INDEPENDENT_AMBULATORY_CARE_PROVIDER_SITE_OTHER): Payer: Managed Care, Other (non HMO)

## 2013-11-06 ENCOUNTER — Encounter: Payer: Self-pay | Admitting: Advanced Practice Midwife

## 2013-11-06 ENCOUNTER — Ambulatory Visit (INDEPENDENT_AMBULATORY_CARE_PROVIDER_SITE_OTHER): Payer: Managed Care, Other (non HMO) | Admitting: Advanced Practice Midwife

## 2013-11-06 VITALS — BP 102/60 | Wt 125.0 lb

## 2013-11-06 DIAGNOSIS — F1920 Other psychoactive substance dependence, uncomplicated: Secondary | ICD-10-CM

## 2013-11-06 DIAGNOSIS — O99320 Drug use complicating pregnancy, unspecified trimester: Secondary | ICD-10-CM

## 2013-11-06 DIAGNOSIS — Z363 Encounter for antenatal screening for malformations: Secondary | ICD-10-CM

## 2013-11-06 DIAGNOSIS — O99019 Anemia complicating pregnancy, unspecified trimester: Secondary | ICD-10-CM

## 2013-11-06 DIAGNOSIS — Z1389 Encounter for screening for other disorder: Secondary | ICD-10-CM

## 2013-11-06 DIAGNOSIS — Z3400 Encounter for supervision of normal first pregnancy, unspecified trimester: Secondary | ICD-10-CM

## 2013-11-06 DIAGNOSIS — Z3402 Encounter for supervision of normal first pregnancy, second trimester: Secondary | ICD-10-CM

## 2013-11-06 DIAGNOSIS — Z331 Pregnant state, incidental: Secondary | ICD-10-CM

## 2013-11-06 DIAGNOSIS — O98319 Other infections with a predominantly sexual mode of transmission complicating pregnancy, unspecified trimester: Secondary | ICD-10-CM

## 2013-11-06 LAB — POCT URINALYSIS DIPSTICK
Blood, UA: NEGATIVE
GLUCOSE UA: NEGATIVE
Ketones, UA: NEGATIVE
LEUKOCYTES UA: NEGATIVE
Nitrite, UA: NEGATIVE
PROTEIN UA: NEGATIVE

## 2013-11-06 NOTE — Progress Notes (Signed)
U/S(20+5wks)- active fetus, meas c/w dates, fluid wnl, anterior Gr 0 placenta, cx appears closed (3.2cm), bilateral adnexa appears wnl, no major abnl noted, FHR- 158 bpm, female fetus

## 2013-11-06 NOTE — Progress Notes (Signed)
Had anatomy scan today.  Normal female.   No c/o at this time.  Routine questions about pregnancy answered.  F/U in 4 weeks for LROB.

## 2013-11-08 ENCOUNTER — Telehealth: Payer: Self-pay | Admitting: *Deleted

## 2013-11-08 DIAGNOSIS — Z3400 Encounter for supervision of normal first pregnancy, unspecified trimester: Secondary | ICD-10-CM

## 2013-11-08 NOTE — Telephone Encounter (Signed)
Pt wanted to know what her results were from her GC/CHL, pt thought that we checked one every visit. Pt was advised to make an appointment with the lab and just have urine done. Pt verbalized understanding. Appointment was made.

## 2013-11-12 ENCOUNTER — Other Ambulatory Visit: Payer: Managed Care, Other (non HMO)

## 2013-11-12 DIAGNOSIS — Z3400 Encounter for supervision of normal first pregnancy, unspecified trimester: Secondary | ICD-10-CM

## 2013-11-13 ENCOUNTER — Encounter: Payer: Self-pay | Admitting: Women's Health

## 2013-11-13 LAB — GC/CHLAMYDIA PROBE AMP
CT PROBE, AMP APTIMA: NEGATIVE
GC PROBE AMP APTIMA: NEGATIVE

## 2013-11-18 ENCOUNTER — Telehealth: Payer: Self-pay | Admitting: Obstetrics & Gynecology

## 2013-11-18 NOTE — Telephone Encounter (Signed)
Pt informed of Negative GC/CHL from 11/12/2013.

## 2013-11-22 ENCOUNTER — Telehealth: Payer: Self-pay | Admitting: *Deleted

## 2013-11-22 MED ORDER — PREDNISONE 10 MG PO TABS: ORAL_TABLET | ORAL | Status: DC

## 2013-11-22 NOTE — Telephone Encounter (Signed)
Spoke with pt letting her know Prednisone was e prescribed to pharmacy. Alicia [name]

## 2013-11-22 NOTE — Telephone Encounter (Signed)
Spoke with pt. Pt states her whole body itches. No visible rash. Skin red esp on feet and legs. Can you Rx a cream or will she need to be seen? Thanks!!

## 2013-12-04 ENCOUNTER — Encounter: Payer: Self-pay | Admitting: Advanced Practice Midwife

## 2013-12-04 ENCOUNTER — Ambulatory Visit (INDEPENDENT_AMBULATORY_CARE_PROVIDER_SITE_OTHER): Payer: Managed Care, Other (non HMO) | Admitting: Advanced Practice Midwife

## 2013-12-04 VITALS — BP 98/60 | Wt 129.0 lb

## 2013-12-04 DIAGNOSIS — Z331 Pregnant state, incidental: Secondary | ICD-10-CM

## 2013-12-04 DIAGNOSIS — O99019 Anemia complicating pregnancy, unspecified trimester: Secondary | ICD-10-CM

## 2013-12-04 DIAGNOSIS — Z1389 Encounter for screening for other disorder: Secondary | ICD-10-CM

## 2013-12-04 DIAGNOSIS — O98319 Other infections with a predominantly sexual mode of transmission complicating pregnancy, unspecified trimester: Secondary | ICD-10-CM

## 2013-12-04 LAB — POCT URINALYSIS DIPSTICK
Blood, UA: NEGATIVE
Glucose, UA: NEGATIVE
Ketones, UA: NEGATIVE
LEUKOCYTES UA: NEGATIVE
Nitrite, UA: NEGATIVE
Protein, UA: NEGATIVE

## 2013-12-04 NOTE — Progress Notes (Signed)
No c/o at this time.  Routine questions about pregnancy answered.  F/U in 3 weeks for PN2/Low-risk ob appt

## 2013-12-04 NOTE — Patient Instructions (Signed)
1. Before your test, do not eat or drink anything for 8-10 hours prior to your  appointment (a small amount of water is allowed and you may take any medicines you normally take). 2. When you arrive, your blood will be drawn for a 'fasting' blood sugar level.  Then you will be given a sweetened carbonated beverage to drink. You should  complete drinking this beverage within five minutes. After finishing the  beverage, you will have your blood drawn exactly 1 and 2 hours later. Having  your blood drawn on time is an important part of this test. A total of three blood  samples will be done. 3. The test takes approximately 2  hours. During the test, do not have anything to  eat or drink. Do not smoke, chew gum (not even sugarless gum) or use breath mints.  4. During the test you should remain close by and seated as much as possible and  avoid walking around. You may want to bring a book or something else to  occupy your time.  5. After your test, you may eat and drink as normal. You may want to bring a snack  to eat after the test is finished. Your provider will advise you as to the results of  this test and any follow-up if necessary  You will also be retested for syphilis, HIV and blood levels (anemia):  You were already tested in the first trimester, but [place] recommends retesting.  Additionally, you will be tested for Type 2 Herpes. MOST people do not know that they have genital herpes, as only around 15% of people have outbreaks.  However, it is still transmittable to other people, including the baby (but only during the birth).  If you test positive for Type 2 Herpes, we place you on a medicine called acyclovir the last 6 weeks of your pregnancy to prevent transmission of the virus to the baby during the birth.    If your sugar test is positive for gestational diabetes, you will be given an phone call and further instructions discussed.  We typically do not call patients with positive  herpes results, but will discuss it at your next appointment.  If you wish to know all of your test results before your next appointment, feel free to call the office, or look up your test results on Mychart.  (The range that the lab uses for normal values of the sugar test are not necessarily the range that is used for pregnant women; if your results are within the range, they are definitely normal.  However, if a value is deemed "high" by the lab, it may not be too high for a pregnant woman.  We will need to discuss the normal range if your value(s) fall in the "high" category).

## 2013-12-23 ENCOUNTER — Telehealth: Payer: Self-pay | Admitting: Women's Health

## 2013-12-23 NOTE — Telephone Encounter (Signed)
Pt wants to know what she can take for cold symptoms, no fever. Pt informed can take Robitussin DM per Dr. Glo Herring. Pt verbalized understanding.

## 2013-12-25 ENCOUNTER — Encounter: Payer: Self-pay | Admitting: Women's Health

## 2013-12-25 ENCOUNTER — Ambulatory Visit (INDEPENDENT_AMBULATORY_CARE_PROVIDER_SITE_OTHER): Payer: Self-pay | Admitting: Women's Health

## 2013-12-25 ENCOUNTER — Other Ambulatory Visit: Payer: Managed Care, Other (non HMO)

## 2013-12-25 VITALS — BP 110/70 | Wt 133.0 lb

## 2013-12-25 DIAGNOSIS — Z1389 Encounter for screening for other disorder: Secondary | ICD-10-CM

## 2013-12-25 DIAGNOSIS — Z331 Pregnant state, incidental: Secondary | ICD-10-CM

## 2013-12-25 DIAGNOSIS — Z3400 Encounter for supervision of normal first pregnancy, unspecified trimester: Secondary | ICD-10-CM

## 2013-12-25 DIAGNOSIS — O99019 Anemia complicating pregnancy, unspecified trimester: Secondary | ICD-10-CM

## 2013-12-25 DIAGNOSIS — O98519 Other viral diseases complicating pregnancy, unspecified trimester: Secondary | ICD-10-CM

## 2013-12-25 LAB — CBC
HEMATOCRIT: 33.3 % — AB (ref 36.0–46.0)
HEMOGLOBIN: 11.1 g/dL — AB (ref 12.0–15.0)
MCH: 27.4 pg (ref 26.0–34.0)
MCHC: 33.3 g/dL (ref 30.0–36.0)
MCV: 82.2 fL (ref 78.0–100.0)
Platelets: 259 10*3/uL (ref 150–400)
RBC: 4.05 MIL/uL (ref 3.87–5.11)
RDW: 15 % (ref 11.5–15.5)
WBC: 9.5 10*3/uL (ref 4.0–10.5)

## 2013-12-25 LAB — POCT URINALYSIS DIPSTICK
Glucose, UA: NEGATIVE
Ketones, UA: NEGATIVE
Leukocytes, UA: NEGATIVE
Nitrite, UA: NEGATIVE
Protein, UA: NEGATIVE
RBC UA: NEGATIVE

## 2013-12-25 LAB — HIV ANTIBODY (ROUTINE TESTING W REFLEX): HIV: NONREACTIVE

## 2013-12-25 LAB — RPR

## 2013-12-25 NOTE — Patient Instructions (Signed)
Third Trimester of Pregnancy The third trimester is from week 29 through week 42, months 7 through 9. The third trimester is a time when the fetus is growing rapidly. At the end of the ninth month, the fetus is about 20 inches in length and weighs 6 10 pounds.  BODY CHANGES Your body goes through many changes during pregnancy. The changes vary from woman to woman.   Your weight will continue to increase. You can expect to gain 25 35 pounds (11 16 kg) by the end of the pregnancy.  You may begin to get stretch marks on your hips, abdomen, and breasts.  You may urinate more often because the fetus is moving lower into your pelvis and pressing on your bladder.  You may develop or continue to have heartburn as a result of your pregnancy.  You may develop constipation because certain hormones are causing the muscles that push waste through your intestines to slow down.  You may develop hemorrhoids or swollen, bulging veins (varicose veins).  You may have pelvic pain because of the weight gain and pregnancy hormones relaxing your joints between the bones in your pelvis. Back aches may result from over exertion of the muscles supporting your posture.  Your breasts will continue to grow and be tender. A yellow discharge may leak from your breasts called colostrum.  Your belly button may stick out.  You may feel short of breath because of your expanding uterus.  You may notice the fetus "dropping," or moving lower in your abdomen.  You may have a bloody mucus discharge. This usually occurs a few days to a week before labor begins.  Your cervix becomes thin and soft (effaced) near your due date. WHAT TO EXPECT AT YOUR PRENATAL EXAMS  You will have prenatal exams every 2 weeks until week 36. Then, you will have weekly prenatal exams. During a routine prenatal visit:  You will be weighed to make sure you and the fetus are growing normally.  Your blood pressure is taken.  Your abdomen will be  measured to track your baby's growth.  The fetal heartbeat will be listened to.  Any test results from the previous visit will be discussed.  You may have a cervical check near your due date to see if you have effaced. At around 36 weeks, your caregiver will check your cervix. At the same time, your caregiver will also perform a test on the secretions of the vaginal tissue. This test is to determine if a type of bacteria, Group B streptococcus, is present. Your caregiver will explain this further. Your caregiver may ask you:  What your birth plan is.  How you are feeling.  If you are feeling the baby move.  If you have had any abnormal symptoms, such as leaking fluid, bleeding, severe headaches, or abdominal cramping.  If you have any questions. Other tests or screenings that may be performed during your third trimester include:  Blood tests that check for low iron levels (anemia).  Fetal testing to check the health, activity level, and growth of the fetus. Testing is done if you have certain medical conditions or if there are problems during the pregnancy. FALSE LABOR You may feel small, irregular contractions that eventually go away. These are called Braxton Hicks contractions, or false labor. Contractions may last for hours, days, or even weeks before true labor sets in. If contractions come at regular intervals, intensify, or become painful, it is best to be seen by your caregiver.  SIGNS OF LABOR   Menstrual-like cramps.  Contractions that are 5 minutes apart or less.  Contractions that start on the top of the uterus and spread down to the lower abdomen and back.  A sense of increased pelvic pressure or back pain.  A watery or bloody mucus discharge that comes from the vagina. If you have any of these signs before the 37th week of pregnancy, call your caregiver right away. You need to go to the hospital to get checked immediately. HOME CARE INSTRUCTIONS   Avoid all  smoking, herbs, alcohol, and unprescribed drugs. These chemicals affect the formation and growth of the baby.  Follow your caregiver's instructions regarding medicine use. There are medicines that are either safe or unsafe to take during pregnancy.  Exercise only as directed by your caregiver. Experiencing uterine cramps is a good sign to stop exercising.  Continue to eat regular, healthy meals.  Wear a good support bra for breast tenderness.  Do not use hot tubs, steam rooms, or saunas.  Wear your seat belt at all times when driving.  Avoid raw meat, uncooked cheese, cat litter boxes, and soil used by cats. These carry germs that can cause birth defects in the baby.  Take your prenatal vitamins.  Try taking a stool softener (if your caregiver approves) if you develop constipation. Eat more high-fiber foods, such as fresh vegetables or fruit and whole grains. Drink plenty of fluids to keep your urine clear or pale yellow.  Take warm sitz baths to soothe any pain or discomfort caused by hemorrhoids. Use hemorrhoid cream if your caregiver approves.  If you develop varicose veins, wear support hose. Elevate your feet for 15 minutes, 3 4 times a day. Limit salt in your diet.  Avoid heavy lifting, wear low heal shoes, and practice good posture.  Rest a lot with your legs elevated if you have leg cramps or low back pain.  Visit your dentist if you have not gone during your pregnancy. Use a soft toothbrush to brush your teeth and be gentle when you floss.  A sexual relationship may be continued unless your caregiver directs you otherwise.  Do not travel far distances unless it is absolutely necessary and only with the approval of your caregiver.  Take prenatal classes to understand, practice, and ask questions about the labor and delivery.  Make a trial run to the hospital.  Pack your hospital bag.  Prepare the baby's nursery.  Continue to go to all your prenatal visits as directed  by your caregiver. SEEK MEDICAL CARE IF:  You are unsure if you are in labor or if your water has broken.  You have dizziness.  You have mild pelvic cramps, pelvic pressure, or nagging pain in your abdominal area.  You have persistent nausea, vomiting, or diarrhea.  You have a bad smelling vaginal discharge.  You have pain with urination. SEEK IMMEDIATE MEDICAL CARE IF:   You have a fever.  You are leaking fluid from your vagina.  You have spotting or bleeding from your vagina.  You have severe abdominal cramping or pain.  You have rapid weight loss or gain.  You have shortness of breath with chest pain.  You notice sudden or extreme swelling of your face, hands, ankles, feet, or legs.  You have not felt your baby move in over an hour.  You have severe headaches that do not go away with medicine.  You have vision changes. Document Released: 10/04/2001 Document Revised: 06/12/2013 Document Reviewed:   You have severe abdominal cramping or pain.   You have rapid weight loss or gain.   You have shortness of breath with chest pain.   You notice sudden or extreme swelling of your face, hands, ankles, feet, or legs.   You have not felt your baby move in over an hour.   You have severe headaches that do not go away with medicine.   You have vision changes.  Document Released: 10/04/2001 Document Revised: 06/12/2013 Document Reviewed: 12/11/2012  [employer] Patient Information 2014 [employer].

## 2013-12-25 NOTE — Progress Notes (Signed)
Reports good fm. Denies uc's, lof, vb, uti s/s.  No complaints.  Encouraged registering for CB classes, and reviewed recommendations for Tdap. Reviewed ptl s/s, fkc.  All questions answered. F/U in 4wks for visit.  PN2 today.

## 2013-12-26 LAB — HSV 2 ANTIBODY, IGG: HSV 2 GLYCOPROTEIN G AB, IGG: 2.03 IV — AB

## 2013-12-26 LAB — ANTIBODY SCREEN: Antibody Screen: NEGATIVE

## 2013-12-26 LAB — GLUCOSE TOLERANCE, 2 HOURS W/ 1HR
GLUCOSE, 2 HOUR: 148 mg/dL — AB (ref 70–139)
GLUCOSE, FASTING: 90 mg/dL (ref 70–99)
Glucose, 1 hour: 176 mg/dL — ABNORMAL HIGH (ref 70–170)

## 2013-12-27 ENCOUNTER — Ambulatory Visit (INDEPENDENT_AMBULATORY_CARE_PROVIDER_SITE_OTHER): Payer: Self-pay | Admitting: Obstetrics & Gynecology

## 2013-12-27 ENCOUNTER — Encounter: Payer: Self-pay | Admitting: Obstetrics & Gynecology

## 2013-12-27 VITALS — BP 100/60 | Wt 136.0 lb

## 2013-12-27 DIAGNOSIS — O2390 Unspecified genitourinary tract infection in pregnancy, unspecified trimester: Secondary | ICD-10-CM

## 2013-12-27 DIAGNOSIS — O98519 Other viral diseases complicating pregnancy, unspecified trimester: Secondary | ICD-10-CM

## 2013-12-27 DIAGNOSIS — Z331 Pregnant state, incidental: Secondary | ICD-10-CM

## 2013-12-27 DIAGNOSIS — N760 Acute vaginitis: Secondary | ICD-10-CM

## 2013-12-27 DIAGNOSIS — O99019 Anemia complicating pregnancy, unspecified trimester: Secondary | ICD-10-CM

## 2013-12-27 DIAGNOSIS — Z1389 Encounter for screening for other disorder: Secondary | ICD-10-CM

## 2013-12-27 LAB — POCT URINALYSIS DIPSTICK
Blood, UA: NEGATIVE
Glucose, UA: NEGATIVE
KETONES UA: NEGATIVE
Nitrite, UA: NEGATIVE
PROTEIN UA: NEGATIVE

## 2013-12-27 MED ORDER — METRONIDAZOLE 500 MG PO TABS: 500.0000 mg | ORAL_TABLET | Freq: Two times a day (BID) | ORAL | Status: DC

## 2013-12-27 NOTE — Addendum Note (Signed)
Addended by: Farley Ly on: 12/27/2013 12:06 PM   Modules accepted: Orders

## 2013-12-27 NOTE — Progress Notes (Signed)
Pt had a larger amount of discharge this am after getting up and aroun, has not continued to leak. Exam Perineum is dry, nitrazine negative no pooling Maryann Alar is negative  +BV  Metronidaole 500 BID x 7  Barely passed her 2 hour GTT but passed

## 2013-12-30 ENCOUNTER — Encounter: Payer: Self-pay | Admitting: Women's Health

## 2013-12-30 DIAGNOSIS — R7689 Other specified abnormal immunological findings in serum: Secondary | ICD-10-CM | POA: Insufficient documentation

## 2013-12-30 DIAGNOSIS — R768 Other specified abnormal immunological findings in serum: Secondary | ICD-10-CM | POA: Insufficient documentation

## 2013-12-31 ENCOUNTER — Encounter: Payer: Self-pay | Admitting: Women's Health

## 2013-12-31 ENCOUNTER — Ambulatory Visit (INDEPENDENT_AMBULATORY_CARE_PROVIDER_SITE_OTHER): Payer: Managed Care, Other (non HMO) | Admitting: Women's Health

## 2013-12-31 VITALS — BP 96/60 | Wt 132.0 lb

## 2013-12-31 DIAGNOSIS — Z3400 Encounter for supervision of normal first pregnancy, unspecified trimester: Secondary | ICD-10-CM

## 2013-12-31 DIAGNOSIS — Z1389 Encounter for screening for other disorder: Secondary | ICD-10-CM

## 2013-12-31 DIAGNOSIS — O98519 Other viral diseases complicating pregnancy, unspecified trimester: Secondary | ICD-10-CM

## 2013-12-31 DIAGNOSIS — O99019 Anemia complicating pregnancy, unspecified trimester: Secondary | ICD-10-CM

## 2013-12-31 DIAGNOSIS — T148XXA Other injury of unspecified body region, initial encounter: Secondary | ICD-10-CM

## 2013-12-31 DIAGNOSIS — Z331 Pregnant state, incidental: Secondary | ICD-10-CM

## 2013-12-31 LAB — POCT URINALYSIS DIPSTICK
Blood, UA: NEGATIVE
Glucose, UA: NEGATIVE
KETONES UA: NEGATIVE
Leukocytes, UA: NEGATIVE
Nitrite, UA: NEGATIVE

## 2013-12-31 NOTE — Progress Notes (Signed)
Work-in: picked up something heavy from counter yesterday and abdomen has been hurting since- constant, worse at times. Hasn't tried anything for pain. Spec exam: cx visually closed, SVE: LTC. Can try apap, warm baths, etc. to see if if it helps. Reports good fm. Denies uc's, lof, vb, uti s/s.  Reviewed ptl s/s, fkc, high-normal 2hr gtt- recommended decreasing carbs.  All questions answered. F/U in 4/1 as scheduled.

## 2013-12-31 NOTE — Patient Instructions (Addendum)
Muscle Strain A muscle strain is an injury that occurs when a muscle is stretched beyond its normal length. Usually a small number of muscle fibers are torn when this happens. Muscle strain is rated in degrees. First-degree strains have the least amount of muscle fiber tearing and pain. Second-degree and third-degree strains have increasingly more tearing and pain.  Usually, recovery from muscle strain takes 1 2 weeks. Complete healing takes 5 6 weeks.  CAUSES  Muscle strain happens when a sudden, violent force placed on a muscle stretches it too far. This may occur with lifting, sports, or a fall.  RISK FACTORS Muscle strain is especially common in athletes.  SIGNS AND SYMPTOMS At the site of the muscle strain, there may be:  Pain.  Bruising.  Swelling.  Difficulty using the muscle due to pain or lack of normal function. DIAGNOSIS  Your health care provider will perform a physical exam and ask about your medical history. TREATMENT  Often, the best treatment for a muscle strain is resting, icing, and applying cold compresses to the injured area.  HOME CARE INSTRUCTIONS   Use the PRICE method of treatment to promote muscle healing during the first 2 3 days after your injury. The PRICE method involves:  Protecting the muscle from being injured again.  Restricting your activity and resting the injured body part.  Icing your injury. To do this, put ice in a plastic bag. Place a towel between your skin and the bag. Then, apply the ice and leave it on from 15 20 minutes each hour. After the third day, switch to moist heat packs.  Apply compression to the injured area with a splint or elastic bandage. Be careful not to wrap it too tightly. This may interfere with blood circulation or increase swelling.  Elevate the injured body part above the level of your heart as often as you can.  Only take over-the-counter or prescription medicines for pain, discomfort, or fever as directed by your  health care provider.  Warming up prior to exercise helps to prevent future muscle strains. SEEK MEDICAL CARE IF:   You have increasing pain or swelling in the injured area.  You have numbness, tingling, or a significant loss of strength in the injured area. MAKE SURE YOU:   Understand these instructions.  Will watch your condition.  Will get help right away if you are not doing well or get worse. Document Released: 10/10/2005 Document Revised: 07/31/2013 Document Reviewed: 05/09/2013 [facility] Patient Information 2014 [place], [place].  Preterm Labor Information Preterm labor is when labor starts at less than 37 weeks of pregnancy. The normal length of a pregnancy is 39 to 41 weeks. CAUSES Often, there is no identifiable underlying cause as to why a woman goes into preterm labor. One of the most common known causes of preterm labor is infection. Infections of the uterus, cervix, vagina, amniotic sac, bladder, kidney, or even the lungs (pneumonia) can cause labor to start. Other suspected causes of preterm labor include:   Urogenital infections, such as yeast infections and bacterial vaginosis.   Uterine abnormalities (uterine shape, uterine septum, fibroids, or bleeding from the placenta).   A cervix that has been operated on (it may fail to stay closed).   Malformations in the fetus.   Multiple gestations (twins, triplets, and so on).   Breakage of the amniotic sac.  RISK FACTORS  Having a previous history of preterm labor.   Having premature rupture of membranes (PROM).   Having a placenta that  covers the opening of the cervix (placenta previa).   Having a placenta that separates from the uterus (placental abruption).   Having a cervix that is too weak to hold the fetus in the uterus (incompetent cervix).   Having too much fluid in the amniotic sac (polyhydramnios).   Taking illegal drugs or smoking while pregnant.   Not gaining enough weight while  pregnant.   Being younger than 79 and older than 23 years old.   Having a low socioeconomic status.   Being African American. SYMPTOMS Signs and symptoms of preterm labor include:   Menstrual-like cramps, abdominal pain, or back pain.  Uterine contractions that are regular, as frequent as six in an hour, regardless of their intensity (may be mild or painful).  Contractions that start on the top of the uterus and spread down to the lower abdomen and back.   A sense of increased pelvic pressure.   A watery or bloody mucus discharge that comes from the vagina.  TREATMENT Depending on the length of the pregnancy and other circumstances, your health care provider may suggest bed rest. If necessary, there are medicines that can be given to stop contractions and to mature the fetal lungs. If labor happens before 34 weeks of pregnancy, a prolonged hospital stay may be recommended. Treatment depends on the condition of both you and the fetus.  WHAT SHOULD YOU DO IF YOU THINK YOU ARE IN PRETERM LABOR? Call your health care provider right away. You will need to go to the hospital to get checked immediately. HOW CAN YOU PREVENT PRETERM LABOR IN FUTURE PREGNANCIES? You should:   Stop smoking if you smoke.  Maintain healthy weight gain and avoid chemicals and drugs that are not necessary.  Be watchful for any type of infection.  Inform your health care provider if you have a known history of preterm labor. Document Released: 12/31/2003 Document Revised: 06/12/2013 Document Reviewed: 11/12/2012 [facility] Patient Information 2014 [place], [place].

## 2014-01-08 ENCOUNTER — Emergency Department (HOSPITAL_COMMUNITY)
Admission: EM | Admit: 2014-01-08 | Discharge: 2014-01-08 | Disposition: A | Discharge status: Home / Self Care | Payer: Managed Care, Other (non HMO) | Attending: Emergency Medicine | Admitting: Emergency Medicine

## 2014-01-08 ENCOUNTER — Encounter (HOSPITAL_COMMUNITY): Payer: Self-pay | Admitting: Emergency Medicine

## 2014-01-08 DIAGNOSIS — Z79899 Other long term (current) drug therapy: Secondary | ICD-10-CM | POA: Insufficient documentation

## 2014-01-08 DIAGNOSIS — Z792 Long term (current) use of antibiotics: Secondary | ICD-10-CM | POA: Insufficient documentation

## 2014-01-08 DIAGNOSIS — IMO0002 Reserved for concepts with insufficient information to code with codable children: Secondary | ICD-10-CM | POA: Insufficient documentation

## 2014-01-08 DIAGNOSIS — Z87440 Personal history of urinary (tract) infections: Secondary | ICD-10-CM | POA: Insufficient documentation

## 2014-01-08 DIAGNOSIS — L299 Pruritus, unspecified: Secondary | ICD-10-CM | POA: Insufficient documentation

## 2014-01-08 DIAGNOSIS — Z880 Allergy status to penicillin: Secondary | ICD-10-CM | POA: Insufficient documentation

## 2014-01-08 DIAGNOSIS — Z87891 Personal history of nicotine dependence: Secondary | ICD-10-CM | POA: Insufficient documentation

## 2014-01-08 DIAGNOSIS — O9989 Other specified diseases and conditions complicating pregnancy, childbirth and the puerperium: Secondary | ICD-10-CM | POA: Insufficient documentation

## 2014-01-08 MED ORDER — DIPHENHYDRAMINE HCL 50 MG/ML IJ SOLN
50.0000 mg | Freq: Once | INTRAMUSCULAR | Status: AC
  Administered 2014-01-08: 50 mg via INTRAMUSCULAR
  Filled 2014-01-08: qty 1

## 2014-01-08 MED ORDER — PREDNISONE 20 MG PO TABS: ORAL_TABLET | ORAL | Status: DC

## 2014-01-08 MED ORDER — PREDNISONE 20 MG PO TABS
40.0000 mg | ORAL_TABLET | Freq: Once | ORAL | Status: AC
  Administered 2014-01-08: 40 mg via ORAL
  Filled 2014-01-08: qty 2

## 2014-01-08 NOTE — Discharge Instructions (Signed)
Pruritus  Pruritis is an itch. There are many different problems that can cause an itch. Dry skin is one of the most common causes of itching. Most cases of itching do not require medical attention.  HOME CARE INSTRUCTIONS  Make sure your skin is moistened on a regular basis. A moisturizer that contains petroleum jelly is best for keeping moisture in your skin. If you develop a rash, you may try the following for relief:   Use corticosteroid cream.  Apply cool compresses to the affected areas.  Bathe with Epsom salts or baking soda in the bathwater.  Soak in colloidal oatmeal baths. These are available at your pharmacy.  Apply baking soda paste to the rash. Stir water into baking soda until it reaches a paste-like consistency.  Use an anti-itch lotion.  Take over-the-counter diphenhydramine medicine by mouth as the instructions direct.  Avoid scratching. Scratching may cause the rash to become infected. If itching is very bad, your caregiver may suggest prescription lotions or creams to lessen your symptoms.  Avoid hot showers, which can make itching worse. A cold shower may help with itching as long as you use a moisturizer after the shower. SEEK MEDICAL CARE IF: The itching does not go away after several days. Document Released: 06/22/2011 Document Revised: 01/02/2012 Document Reviewed: 06/22/2011 [facility] Patient Information 2014 [place], [place].  Pruritus  Pruritis is an itch. There are many different problems that can cause an itch. Dry skin is one of the most common causes of itching. Most cases of itching do not require medical attention.  HOME CARE INSTRUCTIONS  Make sure your skin is moistened on a regular basis. A moisturizer that contains petroleum jelly is best for keeping moisture in your skin. If you develop a rash, you may try the following for relief:   Use corticosteroid cream.  Apply cool compresses to the affected areas.  Bathe with Epsom salts or baking soda in  the bathwater.  Soak in colloidal oatmeal baths. These are available at your pharmacy.  Apply baking soda paste to the rash. Stir water into baking soda until it reaches a paste-like consistency.  Use an anti-itch lotion.  Take over-the-counter diphenhydramine medicine by mouth as the instructions direct.  Avoid scratching. Scratching may cause the rash to become infected. If itching is very bad, your caregiver may suggest prescription lotions or creams to lessen your symptoms.  Avoid hot showers, which can make itching worse. A cold shower may help with itching as long as you use a moisturizer after the shower. SEEK MEDICAL CARE IF: The itching does not go away after several days. Document Released: 06/22/2011 Document Revised: 01/02/2012 Document Reviewed: 06/22/2011 [facility] Patient Information 2014 [place], [place].

## 2014-01-08 NOTE — ED Notes (Signed)
Fetal heart tones assessed. HR 155.

## 2014-01-08 NOTE — ED Notes (Signed)
Patient c/o itching all over; states had same thing last year and was seen and given IV Benadryl.  Patient is 7 months pregnant; told her OB about this and he prescribed something which is not helping.

## 2014-01-08 NOTE — ED Provider Notes (Signed)
CSN: [id number]     Arrival date & time 01/08/14  1804 History   First MD Initiated Contact with Patient 01/08/14 1927     Chief Complaint  Patient presents with  . Pruritis     (Consider location/radiation/quality/duration/timing/severity/associated sxs/prior Treatment) HPI Comments: Alicia [name] is a 23 y.o. G1P0  female who is 7 months pregnant presenting to the Emergency Department complaining of generalized itching that began several days ago.  She has seen her OB for this and state she was given an unknown "pill for itching" which she states is not helping.  PAtient reports hx of similar symptoms last year and was given medication during a ED visit that resolved the problem.  She has tried benadryl also w/o relief.  She denies pregnancy complications, hives, rash, swelling , difficulty breathing or swallowing.  She reports [facility] pre-natal care and has next OB  Visit on April 1.   The history is provided by the patient.    Past Medical History  Diagnosis Date  . Urinary tract infection    Past Surgical History  Procedure Laterality Date  . No past surgeries     Family History  Problem Relation Age of Onset  . Hypertension Mother   . Anemia Mother   . Cancer Maternal Grandmother     breast   History  Substance Use Topics  . Smoking status: Former Smoker -- 0.04 packs/day for .3 years    Types: Cigarettes  . Smokeless tobacco: Never Used  . Alcohol Use: No   OB History   Grav Para Term Preterm Abortions TAB SAB Ect Mult Living   1         0     Review of Systems  Constitutional: Negative for fever, activity change and appetite change.  HENT: Negative for sore throat, trouble swallowing and voice change.   Respiratory: Negative for cough, chest tightness, shortness of breath and wheezing.   Cardiovascular: Negative for chest pain.  Gastrointestinal: Negative for nausea, vomiting and abdominal pain.  Genitourinary: Negative for dysuria.  Musculoskeletal:  Negative for back pain.  Skin: Negative for rash.       Generalized itching  Allergic/Immunologic: Negative for food allergies and immunocompromised state.  Neurological: Negative for dizziness, weakness, light-headedness, numbness and headaches.  Hematological: Negative for adenopathy.  All other systems reviewed and are negative.      Allergies  Penicillins and Sulfa antibiotics  Home Medications   Current Outpatient Rx  Name  Route  Sig  Dispense  Refill  . metroNIDAZOLE (FLAGYL) 500 MG tablet   Oral   Take 1 tablet (500 mg total) by mouth 2 (two) times daily.   14 tablet   0   . omeprazole (PRILOSEC) 20 MG capsule   Oral   Take 1 capsule (20 mg total) by mouth daily. 1 tablet a day   30 capsule   6   . ONDANSETRON PO   Oral   Take 1 tablet by mouth every 6 (six) hours as needed (nausea).         . predniSONE (DELTASONE) 10 MG tablet      Take 4 tablets a day for 10 days   40 tablet   0   . Prenatal Vit-Fe Fumarate-FA (PRENATAL MULTIVITAMIN) TABS tablet   Oral   Take 1 tablet by mouth daily at 12 noon.          BP 119/76  Pulse 94  Temp(Src) 97.5 F (36.4 C) (Oral)  Resp  24  Ht 5\' 2"  (1.575 m)  Wt 133 lb (60.328 kg)  BMI 24.32 kg/m2  SpO2 100%  LMP 06/08/2013 Physical Exam  Nursing note and vitals reviewed. Constitutional: She is oriented to person, place, and time. She appears well-developed and well-nourished. No distress.  HENT:  Head: Normocephalic and atraumatic.  Mouth/Throat: Oropharynx is clear and moist.  Airway patent, no edema  Neck: Normal range of motion. Neck supple.  Cardiovascular: Normal rate, regular rhythm, normal heart sounds and intact distal pulses.   No murmur heard. Pulmonary/Chest: Effort normal and breath sounds normal. No respiratory distress. She exhibits no tenderness.  Abdominal: Soft. She exhibits no distension. There is no tenderness.  Genitourinary:  Patient is gravid  Musculoskeletal: Normal range of  motion. She exhibits no tenderness.  Lymphadenopathy:    She has no cervical adenopathy.  Neurological: She is alert and oriented to person, place, and time. She exhibits normal muscle tone. Coordination normal.  Skin: Skin is warm and dry. No rash noted. There is erythema.  Superficial Scratch marks to left forearm and bilateral LE's.  No hives, excoriations or rash.  Patient actively scratching at [facility]    ED Course  Procedures (including critical care time) Labs Review Labs Reviewed - No data to display Imaging Review No results found.   EKG Interpretation None      MDM   Final diagnoses:  Pruritus   FHT's 155.  Baby was moving during exam.  VSS.  No edema, airway patent.  Pt with generalized itching, w/o rash.  Hx of same. No hx of liver disorders.  Pt hx and care plan discussed with Dr. Lacinda Axon  Patient is resting comfortably.  Feeling better after medications. She agrees to call Dr. Brynda Greathouse office tomorrow to notify them of the prednisone.  HAs appt with Dr. Elonda Husky for April 1  The patient appears reasonably screened and/or stabilized for discharge and I doubt any other medical condition or other [facility] requiring further screening, evaluation, or treatment in the ED at this time prior to discharge.   Anthony Roland L. Arohi Salvatierra, PA-C 01/09/14 1332

## 2014-01-10 ENCOUNTER — Emergency Department (HOSPITAL_COMMUNITY)
Admission: EM | Admit: 2014-01-10 | Discharge: 2014-01-10 | Disposition: A | Discharge status: Home / Self Care | Payer: Managed Care, Other (non HMO) | Attending: Emergency Medicine | Admitting: Emergency Medicine

## 2014-01-10 ENCOUNTER — Encounter (HOSPITAL_COMMUNITY): Payer: Self-pay | Admitting: Emergency Medicine

## 2014-01-10 DIAGNOSIS — Z87891 Personal history of nicotine dependence: Secondary | ICD-10-CM | POA: Insufficient documentation

## 2014-01-10 DIAGNOSIS — Z880 Allergy status to penicillin: Secondary | ICD-10-CM | POA: Insufficient documentation

## 2014-01-10 DIAGNOSIS — Z87440 Personal history of urinary (tract) infections: Secondary | ICD-10-CM | POA: Insufficient documentation

## 2014-01-10 DIAGNOSIS — L299 Pruritus, unspecified: Secondary | ICD-10-CM

## 2014-01-10 DIAGNOSIS — Z79899 Other long term (current) drug therapy: Secondary | ICD-10-CM | POA: Insufficient documentation

## 2014-01-10 MED ORDER — FAMOTIDINE 20 MG PO TABS: 20.0000 mg | ORAL_TABLET | Freq: Two times a day (BID) | ORAL | Status: DC

## 2014-01-10 MED ORDER — DIPHENHYDRAMINE HCL 25 MG PO TABS: 50.0000 mg | ORAL_TABLET | ORAL | Status: DC | PRN

## 2014-01-10 NOTE — Discharge Instructions (Signed)
Pruritus   Pruritis is an itch. There are many different problems that can cause an itch. Dry skin is one of the most common causes of itching. Most cases of itching do not require medical attention.   HOME CARE INSTRUCTIONS   Make sure your skin is moistened on a regular basis. A moisturizer that contains petroleum jelly is best for keeping moisture in your skin. If you develop a rash, you may try the following for relief:    Use corticosteroid cream.   Apply cool compresses to the affected areas.   Bathe with Epsom salts or baking soda in the bathwater.   Soak in colloidal oatmeal baths. These are available at your pharmacy.   Apply baking soda paste to the rash. Stir water into baking soda until it reaches a paste-like consistency.   Use an anti-itch lotion.   Take over-the-counter diphenhydramine medicine by mouth as the instructions direct.   Avoid scratching. Scratching may cause the rash to become infected. If itching is very bad, your caregiver may suggest prescription lotions or creams to lessen your symptoms.   Avoid hot showers, which can make itching worse. A cold shower may help with itching as long as you use a moisturizer after the shower.  SEEK MEDICAL CARE IF:  The itching does not go away after several days.  Document Released: 06/22/2011 Document Revised: 01/02/2012 Document Reviewed: 06/22/2011  [employer] Patient Information 2014 [employer].

## 2014-01-10 NOTE — ED Provider Notes (Signed)
CSN: [id number]     Arrival date & time 01/10/14  9702 History   First MD Initiated Contact with Patient 01/10/14 [phone]     Chief Complaint  Patient presents with  . Pruritis     (Consider location/radiation/quality/duration/timing/severity/associated sxs/prior Treatment) HPI Comments: Patient returns for a repeat evaluation of itching and burning of her skin. She was seen 2 days ago for same. Patient was started on prednisone, reports no improvement. She has not noticed any discrete rash or hives, just the severe, constant, generalized itching, all over her body. She is not experiencing any trouble swallowing or trouble breathing.   Past Medical History  Diagnosis Date  . Urinary tract infection    Past Surgical History  Procedure Laterality Date  . No past surgeries     Family History  Problem Relation Age of Onset  . Hypertension Mother   . Anemia Mother   . Cancer Maternal Grandmother     breast   History  Substance Use Topics  . Smoking status: Former Smoker -- 0.04 packs/day for .3 years    Types: Cigarettes  . Smokeless tobacco: Never Used  . Alcohol Use: No   OB History   Grav Para Term Preterm Abortions TAB SAB Ect Mult Living   1         0     Review of Systems  Skin:       itching  All other systems reviewed and are negative.      Allergies  Penicillins and Sulfa antibiotics  Home Medications   Current Outpatient Rx  Name  Route  Sig  Dispense  Refill  . metroNIDAZOLE (FLAGYL) 500 MG tablet   Oral   Take 1 tablet (500 mg total) by mouth 2 (two) times daily.   14 tablet   0   . predniSONE (DELTASONE) 20 MG tablet      2 tabs po qd x 4 days   8 tablet   0    BP 119/85  Pulse 106  Temp(Src) 97.9 F (36.6 C) (Oral)  Resp 16  Wt 133 lb (60.328 kg)  SpO2 100%  LMP 06/08/2013 Physical Exam  Constitutional: She is oriented to person, place, and time. She appears well-developed and well-nourished. No distress.  HENT:  Head:  Normocephalic and atraumatic.  Right Ear: Hearing normal.  Left Ear: Hearing normal.  Nose: Nose normal.  Mouth/Throat: Oropharynx is clear and moist and mucous membranes are normal.  Eyes: Conjunctivae and EOM are normal. Pupils are equal, round, and reactive to light.  Neck: Normal range of motion. Neck supple.  Cardiovascular: Regular rhythm, S1 normal and S2 normal.  Exam reveals no gallop and no friction rub.   No murmur heard. Pulmonary/Chest: Effort normal and breath sounds normal. No respiratory distress. She exhibits no tenderness.  Abdominal: Soft. Normal appearance and bowel sounds are normal. There is no hepatosplenomegaly. There is no tenderness. There is no rebound, no guarding, no tenderness at McBurney's point and negative Murphy's sign. No hernia.  Musculoskeletal: Normal range of motion.  Neurological: She is alert and oriented to person, place, and time. She has normal strength. No cranial nerve deficit or sensory deficit. Coordination normal. GCS eye subscore is 4. GCS verbal subscore is 5. GCS motor subscore is 6.  Skin: Skin is warm, dry and intact. No rash noted. No cyanosis.  No discrete rash noted, several areas of very superficial scratches and excoriations noted on extremities  Psychiatric: She has a normal mood and  affect. Her speech is normal and behavior is normal. Thought content normal.    ED Course  Procedures (including critical care time) Labs Review Labs Reviewed - No data to display Imaging Review No results found.   EKG Interpretation None      MDM   Final diagnoses:  None    Patient presents with generalized itching. She reports that she was seen a year ago with similar and have immediate response to treatment. Reviewing the records reveal she was given Solu-Medrol in the ER, discharged with prednisone, Benadryl and Pepcid. Patient reports no improvement after initiating Solu-Medrol other day. I suspect that she will require increased  antipruritic medication. Will initiate H1 H2 blockers. Patient is 7 months pregnant, these medications should be safe in pregnancy.    Orpah Greek, MD 01/10/14 [phone]

## 2014-01-10 NOTE — ED Notes (Signed)
Reports itching and burning to skin, all over, worsening x 3 wks.  Seen here for same 2 days ago and given prednisone with no relief.

## 2014-01-11 NOTE — ED Provider Notes (Signed)
Medical screening examination/treatment/procedure(s) were conducted as a shared visit with non-physician practitioner(s) and myself.  I personally evaluated the patient during the encounter.   EKG Interpretation None     Patient is stable of separately. We'll start prednisone. Will followup with obstetrician tomorrow  Nat Christen, MD 01/11/14 [phone]

## 2014-01-22 ENCOUNTER — Ambulatory Visit (INDEPENDENT_AMBULATORY_CARE_PROVIDER_SITE_OTHER): Payer: Self-pay | Admitting: Obstetrics and Gynecology

## 2014-01-22 VITALS — BP 108/60 | Wt 130.0 lb

## 2014-01-22 DIAGNOSIS — O98519 Other viral diseases complicating pregnancy, unspecified trimester: Secondary | ICD-10-CM

## 2014-01-22 DIAGNOSIS — O99019 Anemia complicating pregnancy, unspecified trimester: Secondary | ICD-10-CM

## 2014-01-22 DIAGNOSIS — Z3400 Encounter for supervision of normal first pregnancy, unspecified trimester: Secondary | ICD-10-CM

## 2014-01-22 DIAGNOSIS — Z1389 Encounter for screening for other disorder: Secondary | ICD-10-CM

## 2014-01-22 DIAGNOSIS — Z331 Pregnant state, incidental: Secondary | ICD-10-CM

## 2014-01-22 NOTE — Progress Notes (Signed)
[redacted]w[redacted]d. G1P0. + mild suprapubic abdominal pressure and Braxton-Hicks contractions at night every few hours. No vaginal discharge. Good FM. Childbirth classes encouraged and preterm labor information given.   This chart was scribed by Jenne Campus, Medical Scribe, for Dr. Mallory Shirk on 01/22/14 at 9:30 AM. This chart was reviewed by Dr. Mallory Shirk and is accurate.

## 2014-01-22 NOTE — Patient Instructions (Addendum)
Plase check out [email or web address].info/ and search new baby and parenting classes for more information on childbirth classes   Preterm Labor Information Preterm labor is when labor starts before you are [redacted] weeks pregnant. The normal length of pregnancy is 39 to 41 weeks.  CAUSES  The cause of preterm labor is not often known. The most common known cause is infection. RISK FACTORS  Having a history of preterm labor.  Having your water break before it should.  Having a placenta that covers the opening of the cervix.  Having a placenta that breaks away from the uterus.  Having a cervix that is too weak to hold the baby in the uterus.  Having too much fluid in the amniotic sac.  Taking drugs or smoking while pregnant.  Not gaining enough weight while pregnant.  Being younger than 43 and older than 23 years old.  Having a low income.  Being African American. SYMPTOMS  Period-like cramps, belly (abdominal) pain, or back pain.  Contractions that are regular, as often as six in an hour. They may be mild or painful.  Contractions that start at the top of the belly. They then move to the lower belly and back.  Lower belly pressure that seems to get stronger.  Bleeding from the vagina.  Fluid leaking from the vagina. TREATMENT  Treatment depends on:  Your condition.  The condition of your baby.  How many weeks pregnant you are. Your doctor may have you:  Take medicine to stop contractions.  Stay in bed except to use the restroom (bed rest).  Stay in the hospital. WHAT SHOULD YOU DO IF YOU THINK YOU ARE IN PRETERM LABOR? Call your doctor right away. You need to go to the hospital right away.  HOW CAN YOU PREVENT PRETERM LABOR IN FUTURE PREGNANCIES?  Stop smoking, if you smoke.  Maintain healthy weight gain.  Do not take drugs or be around chemicals that are not needed.  Tell your doctor if you think you have an infection.  Tell your  doctor if you had a preterm labor before. Document Released: 01/06/2009 Document Revised: 07/31/2013 Document Reviewed: 01/06/2009 [facility] Patient Information 2014 [place], [place].

## 2014-01-22 NOTE — Progress Notes (Signed)
Pt denies any problems or concerns at this time.  

## 2014-02-05 ENCOUNTER — Ambulatory Visit (INDEPENDENT_AMBULATORY_CARE_PROVIDER_SITE_OTHER): Payer: Self-pay | Admitting: Advanced Practice Midwife

## 2014-02-05 ENCOUNTER — Encounter: Payer: Self-pay | Admitting: Advanced Practice Midwife

## 2014-02-05 ENCOUNTER — Telehealth: Payer: Self-pay | Admitting: Advanced Practice Midwife

## 2014-02-05 VITALS — BP 110/70 | Wt 129.0 lb

## 2014-02-05 DIAGNOSIS — Z331 Pregnant state, incidental: Secondary | ICD-10-CM

## 2014-02-05 DIAGNOSIS — R768 Other specified abnormal immunological findings in serum: Secondary | ICD-10-CM

## 2014-02-05 DIAGNOSIS — O98519 Other viral diseases complicating pregnancy, unspecified trimester: Secondary | ICD-10-CM

## 2014-02-05 DIAGNOSIS — Z1389 Encounter for screening for other disorder: Secondary | ICD-10-CM

## 2014-02-05 DIAGNOSIS — O99019 Anemia complicating pregnancy, unspecified trimester: Secondary | ICD-10-CM

## 2014-02-05 MED ORDER — ACYCLOVIR 400 MG PO TABS: 400.0000 mg | ORAL_TABLET | Freq: Three times a day (TID) | ORAL | Status: DC

## 2014-02-05 NOTE — Progress Notes (Signed)
No c/o at this time. Couldn't void.  No urinary sx. Counseled about HSV and acyclovir rx'd  Routine questions about pregnancy answered.  F/U in 2 weeks for Low-risk ob appt .

## 2014-02-05 NOTE — Addendum Note (Signed)
Addended by: Traci Sermon A on: 02/05/2014 10:36 AM   Modules accepted: Orders

## 2014-02-06 ENCOUNTER — Emergency Department (HOSPITAL_COMMUNITY)
Admission: EM | Admit: 2014-02-06 | Discharge: 2014-02-06 | Disposition: A | Discharge status: Home / Self Care | Payer: Managed Care, Other (non HMO) | Attending: Emergency Medicine | Admitting: Emergency Medicine

## 2014-02-06 ENCOUNTER — Encounter (HOSPITAL_COMMUNITY): Payer: Self-pay | Admitting: Emergency Medicine

## 2014-02-06 DIAGNOSIS — Z880 Allergy status to penicillin: Secondary | ICD-10-CM | POA: Insufficient documentation

## 2014-02-06 DIAGNOSIS — L299 Pruritus, unspecified: Secondary | ICD-10-CM | POA: Insufficient documentation

## 2014-02-06 DIAGNOSIS — Z87442 Personal history of urinary calculi: Secondary | ICD-10-CM | POA: Insufficient documentation

## 2014-02-06 DIAGNOSIS — Z87891 Personal history of nicotine dependence: Secondary | ICD-10-CM | POA: Insufficient documentation

## 2014-02-06 DIAGNOSIS — Z3490 Encounter for supervision of normal pregnancy, unspecified, unspecified trimester: Secondary | ICD-10-CM

## 2014-02-06 DIAGNOSIS — O9989 Other specified diseases and conditions complicating pregnancy, childbirth and the puerperium: Secondary | ICD-10-CM | POA: Insufficient documentation

## 2014-02-06 DIAGNOSIS — Z79899 Other long term (current) drug therapy: Secondary | ICD-10-CM | POA: Insufficient documentation

## 2014-02-06 MED ORDER — DEXAMETHASONE SODIUM PHOSPHATE 4 MG/ML IJ SOLN
10.0000 mg | Freq: Once | INTRAMUSCULAR | Status: AC
  Administered 2014-02-06: 10 mg via INTRAMUSCULAR
  Filled 2014-02-06: qty 3

## 2014-02-06 NOTE — Discharge Instructions (Signed)
Use lubriderm lotion on your itching skin. Take one zyrtec OTC daily for the itching with pepcid one tablet twice a day for itching. You can use the benadryl for breakthrough itching.You can take a bath in baking soda or epsom salts to soothe your ithcing skin. You need to let your doctors at [facility] know you have been to the ED 3 times for itching. Keep your OB appointments.  Pruritus  Pruritis is an itch. There are many different problems that can cause an itch. Dry skin is one of the most common causes of itching. Most cases of itching do not require medical attention.  HOME CARE INSTRUCTIONS  Make sure your skin is moistened on a regular basis. A moisturizer that contains petroleum jelly is best for keeping moisture in your skin. If you develop a rash, you may try the following for relief:   Use corticosteroid cream.  Apply cool compresses to the affected areas.  Bathe with Epsom salts or baking soda in the bathwater.  Soak in colloidal oatmeal baths. These are available at your pharmacy.  Apply baking soda paste to the rash. Stir water into baking soda until it reaches a paste-like consistency.  Use an anti-itch lotion.  Take over-the-counter diphenhydramine medicine by mouth as the instructions direct.  Avoid scratching. Scratching may cause the rash to become infected. If itching is very bad, your caregiver may suggest prescription lotions or creams to lessen your symptoms.  Avoid hot showers, which can make itching worse. A cold shower may help with itching as long as you use a moisturizer after the shower. SEEK MEDICAL CARE IF: The itching does not go away after several days. Document Released: 06/22/2011 Document Revised: 01/02/2012 Document Reviewed: 06/22/2011 [facility] Patient Information 2014 [place], [place].

## 2014-02-06 NOTE — ED Provider Notes (Signed)
CSN: [id number]     Arrival date & time 02/06/14  1548 History   First MD Initiated Contact with Patient 02/06/14 1556     Chief Complaint  Patient presents with  . Pruritis     (Consider location/radiation/quality/duration/timing/severity/associated sxs/prior Treatment) HPI The patient is G1 P0 AB 0. She will be [redacted] weeks pregnant tomorrow. Her EDC is May 29. [facility] She reports she's had generalized itching of her skin without a rash for the past 2 months. She states it is diffuse and is everywhere including her palms,soles, extremities, trunk, and face. She states she had the same thing last year in the spring. She denies any new lotions, soaps, or detergents. She denies any change in her diet. She states after the rash started she did change her laundry detergent without improvement. No one else in the house house is itching.  OB [facility]  Past Medical History  Diagnosis Date  . Urinary tract infection    Past Surgical History  Procedure Laterality Date  . No past surgeries     Family History  Problem Relation Age of Onset  . Hypertension Mother   . Anemia Mother   . Cancer Maternal Grandmother     breast   History  Substance Use Topics  . Smoking status: Former Smoker -- 0.04 packs/day for .3 years    Types: Cigarettes  . Smokeless tobacco: Never Used  . Alcohol Use: No   employed  OB History   Grav Para Term Preterm Abortions TAB SAB Ect Mult Living   1         0     Review of Systems  All other systems reviewed and are negative.     Allergies  Penicillins and Sulfa antibiotics  Home Medications   Prior to Admission medications   Medication Sig Start Date End Date Taking? Authorizing Provider  acyclovir (ZOVIRAX) 400 MG tablet Take 1 tablet (400 mg total) by mouth 3 (three) times daily. 02/05/14  Yes Christin Fudge, CNM  Prenatal Vit-Fe Fumarate-FA (MULTIVITAMIN-PRENATAL) 27-0.8 MG TABS tablet Take 1 tablet by mouth  daily at 12 noon.   Yes Historical Provider, MD   BP 122/78  Pulse 124  Temp(Src) 97.9 F (36.6 C) (Oral)  Resp 18  Ht 5\' 2"  (1.575 m)  Wt 128 lb (58.06 kg)  BMI 23.41 kg/m2  SpO2 100%  LMP 06/08/2013  Vital signs normal except tachycardia  Physical Exam  Nursing note and vitals reviewed. Constitutional: She is oriented to person, place, and time. She appears well-developed and well-nourished.  Non-toxic appearance. She does not appear ill. No distress.  HENT:  Head: Normocephalic and atraumatic.  Right Ear: External ear normal.  Left Ear: External ear normal.  Nose: Nose normal. No mucosal edema or rhinorrhea.  Mouth/Throat: Oropharynx is clear and moist and mucous membranes are normal. No dental abscesses or uvula swelling.  Eyes: Conjunctivae and EOM are normal. Pupils are equal, round, and reactive to light.  Neck: Normal range of motion and full passive range of motion without pain. Neck supple.  Cardiovascular: Normal rate, regular rhythm and normal heart sounds.  Exam reveals no gallop and no friction rub.   No murmur heard. Pulmonary/Chest: Effort normal and breath sounds normal. No respiratory distress. She has no wheezes. She has no rhonchi. She has no rales. She exhibits no tenderness and no crepitus.  Abdominal: Soft. Normal appearance and bowel sounds are normal. She exhibits no distension. There is no  tenderness. There is no rebound and no guarding.  Abdomen c/w dates  Musculoskeletal: Normal range of motion. She exhibits no edema and no tenderness.  Moves all extremities well.   Neurological: She is alert and oriented to person, place, and time. She has normal strength. No cranial nerve deficit.  Skin: Skin is warm, dry and intact. No rash noted. No erythema. No pallor.  Patient has no urticarial lesions. There were no blisters or vesicles. There are no lesions in her inner digital spaces of her fingers or her toes. There is no rash around her waistline. She does  have a couple of excoriated areas that are from scratching, but they are rare. She indicates her palms itch and basically her whole body images. There is no rash on her palms or soles.  Psychiatric: She has a normal mood and affect. Her speech is normal and behavior is normal. Her mood appears not anxious.    ED Course  Procedures (including critical care time)  Medications  dexamethasone (DECADRON) injection 10 mg (10 mg Intramuscular Given 02/06/14 1635)    Review of her OB visits does not reveal any complaints of itching. This is her third ED visit for itching. She was seen on March 18 and March 20. She states the Benadryl, prednisone, and Pepcid don't help. Patient has no evidence of scabies. She has no obvious skin lesions. I will recommend symptomatic dry skin treatment.   Labs Review Labs Reviewed - No data to display  Imaging Review No results found.   EKG Interpretation None      MDM   Final diagnoses:  Pruritus  Pregnancy    Meds over-the-counter Zyrtec Over-the-counter Pepcid Lubriderm lotion Epsom salts/baking soda baths   Plan discharge   Rolland Porter, MD, Abr[facility] Sander      Janice Norrie, MD 02/06/14 [phone]

## 2014-02-06 NOTE — ED Notes (Signed)
nad noted prior to dc. Dc instructions reviewed and explained. Voiced understanding.  

## 2014-02-06 NOTE — ED Notes (Signed)
Pt c/o generalized itching x 2 months. Pt [redacted] weeks pregnant.

## 2014-02-13 ENCOUNTER — Telehealth: Payer: Self-pay | Admitting: Obstetrics and Gynecology

## 2014-02-13 MED ORDER — FLUCONAZOLE 150 MG PO TABS: ORAL_TABLET | ORAL | Status: DC

## 2014-02-13 NOTE — Telephone Encounter (Signed)
Pt c/o vaginal itching and swelling, no discharge. Pt requesting RX for yeast. Has recently been on an abx.

## 2014-02-13 NOTE — Telephone Encounter (Signed)
Diflucan to pharmacy  

## 2014-02-17 ENCOUNTER — Encounter (HOSPITAL_COMMUNITY): Payer: Self-pay | Admitting: *Deleted

## 2014-02-17 ENCOUNTER — Inpatient Hospital Stay (HOSPITAL_COMMUNITY)
Admission: AD | Admit: 2014-02-17 | Discharge: 2014-02-17 | Disposition: A | Discharge status: Home / Self Care | Payer: Managed Care, Other (non HMO) | Source: Ambulatory Visit | Attending: Family Medicine | Admitting: Family Medicine

## 2014-02-17 DIAGNOSIS — O479 False labor, unspecified: Secondary | ICD-10-CM

## 2014-02-17 DIAGNOSIS — Z87891 Personal history of nicotine dependence: Secondary | ICD-10-CM | POA: Insufficient documentation

## 2014-02-17 DIAGNOSIS — O4700 False labor before 37 completed weeks of gestation, unspecified trimester: Secondary | ICD-10-CM | POA: Insufficient documentation

## 2014-02-17 LAB — WET PREP, GENITAL
CLUE CELLS WET PREP: NONE SEEN
Trich, Wet Prep: NONE SEEN
Yeast Wet Prep HPF POC: NONE SEEN

## 2014-02-17 LAB — POCT FERN TEST: POCT FERN TEST: NEGATIVE

## 2014-02-17 MED ORDER — CETIRIZINE HCL 10 MG PO TABS: 10.0000 mg | ORAL_TABLET | Freq: Every day | ORAL | Status: DC

## 2014-02-17 MED ORDER — TRAMADOL HCL 50 MG PO TABS: 50.0000 mg | ORAL_TABLET | Freq: Four times a day (QID) | ORAL | Status: DC | PRN

## 2014-02-17 NOTE — Discharge Instructions (Signed)
Third Trimester of Pregnancy °The third trimester is from week 29 through week 42, months 7 through 9. The third trimester is a time when the fetus is growing rapidly. At the end of the ninth month, the fetus is about 20 inches in length and weighs 6 10 pounds.  °BODY CHANGES °Your body goes through many changes during pregnancy. The changes vary from woman to woman.  °· Your weight will continue to increase. You can expect to gain 25 35 pounds (11 16 kg) by the end of the pregnancy. °· You may begin to get stretch marks on your hips, abdomen, and breasts. °· You may urinate more often because the fetus is moving lower into your pelvis and pressing on your bladder. °· You may develop or continue to have heartburn as a result of your pregnancy. °· You may develop constipation because certain hormones are causing the muscles that push waste through your intestines to slow down. °· You may develop hemorrhoids or swollen, bulging veins (varicose veins). °· You may have pelvic pain because of the weight gain and pregnancy hormones relaxing your joints between the bones in your pelvis. Back aches may result from over exertion of the muscles supporting your posture. °· Your breasts will continue to grow and be tender. A yellow discharge may leak from your breasts called colostrum. °· Your belly button may stick out. °· You may feel short of breath because of your expanding uterus. °· You may notice the fetus "dropping," or moving lower in your abdomen. °· You may have a bloody mucus discharge. This usually occurs a few days to a week before labor begins. °· Your cervix becomes thin and soft (effaced) near your due date. °WHAT TO EXPECT AT YOUR PRENATAL EXAMS  °You will have prenatal exams every 2 weeks until week 36. Then, you will have weekly prenatal exams. During a routine prenatal visit: °· You will be weighed to make sure you and the fetus are growing normally. °· Your blood pressure is taken. °· Your abdomen will be  measured to track your baby's growth. °· The fetal heartbeat will be listened to. °· Any test results from the previous visit will be discussed. °· You may have a cervical check near your due date to see if you have effaced. °At around 36 weeks, your caregiver will check your cervix. At the same time, your caregiver will also perform a test on the secretions of the vaginal tissue. This test is to determine if a type of bacteria, Group B streptococcus, is present. Your caregiver will explain this further. °Your caregiver may ask you: °· What your birth plan is. °· How you are feeling. °· If you are feeling the baby move. °· If you have had any abnormal symptoms, such as leaking fluid, bleeding, severe headaches, or abdominal cramping. °· If you have any questions. °Other tests or screenings that may be performed during your third trimester include: °· Blood tests that check for low iron levels (anemia). °· Fetal testing to check the health, activity level, and growth of the fetus. Testing is done if you have certain medical conditions or if there are problems during the pregnancy. °FALSE LABOR °You may feel small, irregular contractions that eventually go away. These are called Braxton Hicks contractions, or false labor. Contractions may last for hours, days, or even weeks before true labor sets in. If contractions come at regular intervals, intensify, or become painful, it is best to be seen by your caregiver.  °  SIGNS OF LABOR  °· Menstrual-like cramps. °· Contractions that are 5 minutes apart or less. °· Contractions that start on the top of the uterus and spread down to the lower abdomen and back. °· A sense of increased pelvic pressure or back pain. °· A watery or bloody mucus discharge that comes from the vagina. °If you have any of these signs before the 37th week of pregnancy, call your caregiver right away. You need to go to the hospital to get checked immediately. °HOME CARE INSTRUCTIONS  °· Avoid all  smoking, herbs, alcohol, and unprescribed drugs. These chemicals affect the formation and growth of the baby. °· Follow your caregiver's instructions regarding medicine use. There are medicines that are either safe or unsafe to take during pregnancy. °· Exercise only as directed by your caregiver. Experiencing uterine cramps is a good sign to stop exercising. °· Continue to eat regular, healthy meals. °· Wear a good support bra for breast tenderness. °· Do not use hot tubs, steam rooms, or saunas. °· Wear your seat belt at all times when driving. °· Avoid raw meat, uncooked cheese, cat litter boxes, and soil used by cats. These carry germs that can cause birth defects in the baby. °· Take your prenatal vitamins. °· Try taking a stool softener (if your caregiver approves) if you develop constipation. Eat more high-fiber foods, such as fresh vegetables or fruit and whole grains. Drink plenty of fluids to keep your urine clear or pale yellow. °· Take warm sitz baths to soothe any pain or discomfort caused by hemorrhoids. Use hemorrhoid cream if your caregiver approves. °· If you develop varicose veins, wear support hose. Elevate your feet for 15 minutes, 3 4 times a day. Limit salt in your diet. °· Avoid heavy lifting, wear low heal shoes, and practice good posture. °· Rest a lot with your legs elevated if you have leg cramps or low back pain. °· Visit your dentist if you have not gone during your pregnancy. Use a soft toothbrush to brush your teeth and be gentle when you floss. °· A sexual relationship may be continued unless your caregiver directs you otherwise. °· Do not travel far distances unless it is absolutely necessary and only with the approval of your caregiver. °· Take prenatal classes to understand, practice, and ask questions about the labor and delivery. °· Make a trial run to the hospital. °· Pack your hospital bag. °· Prepare the baby's nursery. °· Continue to go to all your prenatal visits as directed  by your caregiver. °SEEK MEDICAL CARE IF: °· You are unsure if you are in labor or if your water has broken. °· You have dizziness. °· You have mild pelvic cramps, pelvic pressure, or nagging pain in your abdominal area. °· You have persistent nausea, vomiting, or diarrhea. °· You have a bad smelling vaginal discharge. °· You have pain with urination. °SEEK IMMEDIATE MEDICAL CARE IF:  °· You have a fever. °· You are leaking fluid from your vagina. °· You have spotting or bleeding from your vagina. °· You have severe abdominal cramping or pain. °· You have rapid weight loss or gain. °· You have shortness of breath with chest pain. °· You notice sudden or extreme swelling of your face, hands, ankles, feet, or legs. °· You have not felt your baby move in over an hour. °· You have severe headaches that do not go away with medicine. °· You have vision changes. °Document Released: 10/04/2001 Document Revised: 06/12/2013 Document Reviewed:   You have severe abdominal cramping or pain.   You have rapid weight loss or gain.   You have shortness of breath with chest pain.   You notice sudden or extreme swelling of your face, hands, ankles, feet, or legs.   You have not felt your baby move in over an hour.   You have severe headaches that do not go away with medicine.   You have vision changes.  Document Released: 10/04/2001 Document Revised: 06/12/2013 Document Reviewed: 12/11/2012  [employer] Patient Information 2014 [employer].

## 2014-02-17 NOTE — MAU Note (Signed)
Patient states she is having contractions every 6-9 minutes. Denies bleeding or leaking and reports good fetal movement.

## 2014-02-17 NOTE — MAU Provider Note (Signed)
  History     CSN: [id number]  Arrival date and time: 02/17/14 6144   First Provider Initiated Contact with Patient 02/17/14 [phone]      Chief Complaint  Patient presents with  . Labor Eval   HPI  Pt is a 23 yo G1P0 at [redacted]w[redacted]d wks IUP here with report of contractions that started last night and increased in frequency this AM.  Denies vaginal bleeding or leaking of fluid.  History of +HSVII, no report of lesions.    Past Medical History  Diagnosis Date  . Urinary tract infection     Past Surgical History  Procedure Laterality Date  . No past surgeries      Family History  Problem Relation Age of Onset  . Hypertension Mother   . Anemia Mother   . Cancer Maternal Grandmother     breast    History  Substance Use Topics  . Smoking status: Former Smoker -- 0.04 packs/day for .3 years    Types: Cigarettes  . Smokeless tobacco: Never Used  . Alcohol Use: No    Allergies:  Allergies  Allergen Reactions  . Penicillins Hives  . Sulfa Antibiotics Hives and Swelling    Prescriptions prior to admission  Medication Sig Dispense Refill  . acyclovir (ZOVIRAX) 400 MG tablet Take 1 tablet (400 mg total) by mouth 3 (three) times daily.  90 tablet  2  . fluconazole (DIFLUCAN) 150 MG tablet 1 po stat; repeat in 3 days  2 tablet  2  . Prenatal Vit-Fe Fumarate-FA (MULTIVITAMIN-PRENATAL) 27-0.8 MG TABS tablet Take 1 tablet by mouth daily at 12 noon.        Review of Systems  Gastrointestinal: Positive for abdominal pain (contractions).  All other systems reviewed and are negative.  Physical Exam   Blood pressure 127/90, pulse 109, temperature 98.2 F (36.8 C), temperature source Oral, resp. rate 16, height 5\' 1"  (1.549 m), weight 58.333 kg (128 lb 9.6 oz), last menstrual period 06/08/2013.  Physical Exam  Constitutional: She is oriented to person, place, and time. She appears well-developed and well-nourished. No distress.  HENT:  Head: Normocephalic.  Neck: Normal range of  motion. Neck supple.  Cardiovascular: Normal rate, regular rhythm and normal heart sounds.   Respiratory: Effort normal and breath sounds normal.  GI: Soft. There is no tenderness.  Genitourinary:    No bleeding around the vagina. Vaginal discharge (mucusy, clear discharge on perineum) found.  Musculoskeletal: Normal range of motion.  Neurological: She is alert and oriented to person, place, and time.  Skin: Skin is warm and dry.   Dilation: 1.5 Effacement (%): 80 Cervical Position: Middle Station: -1 Presentation: Vertex Exam by:: Velna Ochs RN  Fern - negative  MAU Course  Procedures  0820 Report given to M. Jimmye Norman who assumes care of the patient. [place], CNM  Assessment and Plan    No change in cervix after 1.5 hrs FHR reactive Will start on Zyrtec for itching Will give limited Rx Tramadol for pain Seabron Spates, CNM

## 2014-02-19 ENCOUNTER — Encounter: Payer: Self-pay | Admitting: Women's Health

## 2014-02-19 ENCOUNTER — Ambulatory Visit (INDEPENDENT_AMBULATORY_CARE_PROVIDER_SITE_OTHER): Payer: Self-pay | Admitting: Women's Health

## 2014-02-19 VITALS — BP 120/80 | Wt 131.0 lb

## 2014-02-19 DIAGNOSIS — Z1389 Encounter for screening for other disorder: Secondary | ICD-10-CM

## 2014-02-19 DIAGNOSIS — Z3400 Encounter for supervision of normal first pregnancy, unspecified trimester: Secondary | ICD-10-CM

## 2014-02-19 DIAGNOSIS — L292 Pruritus vulvae: Secondary | ICD-10-CM

## 2014-02-19 DIAGNOSIS — Z331 Pregnant state, incidental: Secondary | ICD-10-CM

## 2014-02-19 DIAGNOSIS — O98519 Other viral diseases complicating pregnancy, unspecified trimester: Secondary | ICD-10-CM

## 2014-02-19 DIAGNOSIS — L293 Anogenital pruritus, unspecified: Secondary | ICD-10-CM

## 2014-02-19 DIAGNOSIS — O9989 Other specified diseases and conditions complicating pregnancy, childbirth and the puerperium: Secondary | ICD-10-CM

## 2014-02-19 DIAGNOSIS — O99019 Anemia complicating pregnancy, unspecified trimester: Secondary | ICD-10-CM

## 2014-02-19 MED ORDER — NYSTATIN-TRIAMCINOLONE 100000-0.1 UNIT/GM-% EX OINT: 1.0000 "application " | TOPICAL_OINTMENT | Freq: Two times a day (BID) | CUTANEOUS | Status: DC

## 2014-02-19 NOTE — Addendum Note (Signed)
Addended by: Linton Rump on: 02/19/2014 10:45 AM   Modules accepted: Orders

## 2014-02-19 NOTE — Patient Instructions (Addendum)
Eucerin or Aquaphor lotion  [place] Pediatricians:  [facility] [phone]                 [place] [phone] (usually doesn't accept new patients unless you have family there already, you are always welcome to call and ask)             [facility] ([place]) [phone]   [facility]ians:   [place]: [phone]  [facility]: [phone]    Circumcision: $507 at hospital, $244 at [facility], has to be paid up front before it is done. If you want the circumcision done at [facility] you can make payments during pregnancy. If you are interested in this, see receptionist at [place].    Preterm Labor Information Preterm labor is when labor starts at less than 37 weeks of pregnancy. The normal length of a pregnancy is 39 to 41 weeks. CAUSES Often, there is no identifiable underlying cause as to why a woman goes into preterm labor. One of the most common known causes of preterm labor is infection. Infections of the uterus, cervix, vagina, amniotic sac, bladder, kidney, or even the lungs (pneumonia) can cause labor to start. Other suspected causes of preterm labor include:   Urogenital infections, such as yeast infections and bacterial vaginosis.   Uterine abnormalities (uterine shape, uterine septum, fibroids, or bleeding from the placenta).   A cervix that has been operated on (it may fail to stay closed).   Malformations in the fetus.   Multiple gestations (twins, triplets, and so on).   Breakage of the amniotic sac.  RISK FACTORS  Having a previous history of preterm labor.   Having premature rupture of membranes (PROM).   Having a placenta that covers the opening of the cervix (placenta previa).   Having a placenta that separates from the uterus (placental abruption).   Having a cervix  that is too weak to hold the fetus in the uterus (incompetent cervix).   Having too much fluid in the amniotic sac (polyhydramnios).   Taking illegal drugs or smoking while pregnant.   Not gaining enough weight while pregnant.   Being younger than 75 and older than 23 years old.   Having a low socioeconomic status.   Being African American. SYMPTOMS Signs and symptoms of preterm labor include:   Menstrual-like cramps, abdominal pain, or back pain.  Uterine contractions that are regular, as frequent as six in an hour, regardless of their intensity (may be mild or painful).  Contractions that start on the top of the uterus and spread down to the lower abdomen and back.   A sense of increased pelvic pressure.   A watery or bloody mucus discharge that comes from the vagina.  TREATMENT Depending on the length of the pregnancy and other circumstances, your health care provider may suggest bed rest. If necessary, there are medicines that can be given to stop contractions and to mature the fetal lungs. If labor happens before 34 weeks of pregnancy, a prolonged hospital stay may be recommended. Treatment depends on the condition of both you and the fetus.  WHAT SHOULD YOU DO IF YOU THINK YOU ARE IN PRETERM LABOR? Call your health care provider right away. You will need to go to the hospital to get checked immediately. HOW CAN YOU PREVENT PRETERM LABOR IN FUTURE PREGNANCIES? You should:  Stop smoking if you smoke.  Maintain healthy weight gain and avoid chemicals and drugs that are not necessary.  Be watchful for any type of infection.  Inform your health care provider if you have a known history of preterm labor. Document Released: 12/31/2003 Document Revised: 06/12/2013 Document Reviewed: 11/12/2012 [facility] Patient Information 2014 [place], [place].

## 2014-02-19 NOTE — Progress Notes (Signed)
Unable to void. Will try before she leaves. Reports good fm. Denies regular uc's, lof, vb, uti s/s.  Went to [facility] Monday w/ uc's, was 1.5cm. Generalized pruritus x few months, not worse at night, but does itch on palms and soles. Will get fasting bile salts in am. Try eucerin or aquaphor, benadyrl. Also vulvar itching- wet prep neg Monday. Rx mytrex. Reviewed ptl s/s, fkc.  Hasn't made it to cb classes, recommended going to tour. All questions answered. F/U in 1wk for visit.

## 2014-02-20 ENCOUNTER — Other Ambulatory Visit: Payer: Managed Care, Other (non HMO)

## 2014-02-20 LAB — OB RESULTS CONSOLE GC/CHLAMYDIA
CHLAMYDIA, DNA PROBE: NEGATIVE
Gonorrhea: NEGATIVE

## 2014-02-21 LAB — GC/CHLAMYDIA PROBE AMP
CT Probe RNA: NEGATIVE
GC PROBE AMP APTIMA: NEGATIVE

## 2014-02-22 ENCOUNTER — Encounter: Payer: Self-pay | Admitting: Women's Health

## 2014-02-26 ENCOUNTER — Ambulatory Visit (INDEPENDENT_AMBULATORY_CARE_PROVIDER_SITE_OTHER): Payer: Self-pay | Admitting: Women's Health

## 2014-02-26 ENCOUNTER — Encounter: Payer: Self-pay | Admitting: Women's Health

## 2014-02-26 VITALS — BP 120/88 | Wt 130.0 lb

## 2014-02-26 DIAGNOSIS — N9089 Other specified noninflammatory disorders of vulva and perineum: Secondary | ICD-10-CM

## 2014-02-26 DIAGNOSIS — O9989 Other specified diseases and conditions complicating pregnancy, childbirth and the puerperium: Secondary | ICD-10-CM

## 2014-02-26 DIAGNOSIS — Z331 Pregnant state, incidental: Secondary | ICD-10-CM

## 2014-02-26 DIAGNOSIS — Z3400 Encounter for supervision of normal first pregnancy, unspecified trimester: Secondary | ICD-10-CM

## 2014-02-26 DIAGNOSIS — O98519 Other viral diseases complicating pregnancy, unspecified trimester: Secondary | ICD-10-CM

## 2014-02-26 DIAGNOSIS — O99019 Anemia complicating pregnancy, unspecified trimester: Secondary | ICD-10-CM

## 2014-02-26 DIAGNOSIS — Z1389 Encounter for screening for other disorder: Secondary | ICD-10-CM

## 2014-02-26 LAB — POCT URINALYSIS DIPSTICK
Glucose, UA: NEGATIVE
KETONES UA: NEGATIVE
Leukocytes, UA: NEGATIVE
NITRITE UA: NEGATIVE
Protein, UA: NEGATIVE
RBC UA: NEGATIVE

## 2014-02-26 LAB — OB RESULTS CONSOLE GBS: STREP GROUP B AG: NEGATIVE

## 2014-02-26 NOTE — Patient Instructions (Signed)
Call the office [phone]) or go to [facility] if:  You begin to have strong, frequent contractions  Your water breaks.  Sometimes it is a big gush of fluid, sometimes it is just a trickle that keeps getting your panties wet or running down your legs  You have vaginal bleeding.  It is normal to have a small amount of spotting if your cervix was checked.   You don't feel your baby moving like normal.  If you don't, get you something to eat and drink and lay down and focus on feeling your baby move.  You should feel at least 10 movements in 2 hours.  If you don't, you should call the office or go to [facility].   Breastfeeding Deciding to breastfeed is one of the best choices you can make for you and your baby. A change in hormones during pregnancy causes your breast tissue to grow and increases the number and size of your milk ducts. These hormones also allow proteins, sugars, and fats from your blood supply to make breast milk in your milk-producing glands. Hormones prevent breast milk from being released before your baby is born as well as prompt milk flow after birth. Once breastfeeding has begun, thoughts of your baby, as well as his or her sucking or crying, can stimulate the release of milk from your milk-producing glands.  BENEFITS OF BREASTFEEDING For Your Baby  Your first milk (colostrum) helps your baby's digestive system function better.   There are antibodies in your milk that help your baby fight off infections.   Your baby has a lower incidence of asthma, allergies, and sudden infant death syndrome.   The nutrients in breast milk are better for your baby than infant formulas and are designed uniquely for your baby's needs.   Breast milk improves your baby's brain development.   Your baby is less likely to develop other conditions, such as childhood obesity, asthma, or type 2 diabetes mellitus.  For You   Breastfeeding helps to create a very special bond  between you and your baby.   Breastfeeding is convenient. Breast milk is always available at the correct temperature and costs nothing.   Breastfeeding helps to burn calories and helps you lose the weight gained during pregnancy.   Breastfeeding makes your uterus contract to its prepregnancy size faster and slows bleeding (lochia) after you give birth.   Breastfeeding helps to lower your risk of developing type 2 diabetes mellitus, osteoporosis, and breast or ovarian cancer later in life. SIGNS THAT YOUR BABY IS HUNGRY Early Signs of Hunger  Increased alertness or activity.  Stretching.  Movement of the head from side to side.  Movement of the head and opening of the mouth when the corner of the mouth or cheek is stroked (rooting).  Increased sucking sounds, smacking lips, cooing, sighing, or squeaking.  Hand-to-mouth movements.  Increased sucking of fingers or hands. Late Signs of Hunger  Fussing.  Intermittent crying. Extreme Signs of Hunger Signs of extreme hunger will require calming and consoling before your baby will be able to breastfeed successfully. Do not wait for the following signs of extreme hunger to occur before you initiate breastfeeding:   Restlessness.  A loud, strong cry.   Screaming. BREASTFEEDING BASICS Breastfeeding Initiation  Find a comfortable place to sit or lie down, with your neck and back well supported.  Place a pillow or rolled up blanket under your baby to bring him or her to the level of your breast (  if you are seated). Nursing pillows are specially designed to help support your arms and your baby while you breastfeed.  Make sure that your baby's abdomen is facing your abdomen.   Gently massage your breast. With your fingertips, massage from your chest wall toward your nipple in a circular motion. This encourages milk flow. You may need to continue this action during the feeding if your milk flows slowly.  Support your breast  with 4 fingers underneath and your thumb above your nipple. Make sure your fingers are well away from your nipple and your baby's mouth.   Stroke your baby's lips gently with your finger or nipple.   When your baby's mouth is open wide enough, quickly bring your baby to your breast, placing your entire nipple and as much of the colored area around your nipple (areola) as possible into your baby's mouth.   More areola should be visible above your baby's upper lip than below the lower lip.   Your baby's tongue should be between his or her lower gum and your breast.   Ensure that your baby's mouth is correctly positioned around your nipple (latched). Your baby's lips should create a seal on your breast and be turned out (everted).  It is common for your baby to suck about 2 3 minutes in order to start the flow of breast milk. Latching Teaching your baby how to latch on to your breast properly is very important. An improper latch can cause nipple pain and decreased milk supply for you and poor weight gain in your baby. Also, if your baby is not latched onto your nipple properly, he or she may swallow some air during feeding. This can make your baby fussy. Burping your baby when you switch breasts during the feeding can help to get rid of the air. However, teaching your baby to latch on properly is still the best way to prevent fussiness from swallowing air while breastfeeding. Signs that your baby has successfully latched on to your nipple:    Silent tugging or silent sucking, without causing you pain.   Swallowing heard between every 3 4 sucks.    Muscle movement above and in front of his or her ears while sucking.  Signs that your baby has not successfully latched on to nipple:   Sucking sounds or smacking sounds from your baby while breastfeeding.  Nipple pain. If you think your baby has not latched on correctly, slip your finger into the corner of your baby's mouth to break the  suction and place it between your baby's gums. Attempt breastfeeding initiation again. Signs of Successful Breastfeeding Signs from your baby:   A gradual decrease in the number of sucks or complete cessation of sucking.   Falling asleep.   Relaxation of his or her body.   Retention of a small amount of milk in his or her mouth.   Letting go of your breast by himself or herself. Signs from you:  Breasts that have increased in firmness, weight, and size 1 3 hours after feeding.   Breasts that are softer immediately after breastfeeding.  Increased milk volume, as well as a change in milk consistency and color by the 5th day of breastfeeding.   Nipples that are not sore, cracked, or bleeding. Signs That Your Randel Books is Getting Enough Milk  Wetting at least 3 diapers in a 24-hour period. The urine should be clear and pale yellow by age 44 days.  At least 3 stools in a 24-hour  period by age 95 days. The stool should be soft and yellow.  At least 3 stools in a 24-hour period by age 456 days. The stool should be seedy and yellow.  No loss of weight greater than 10% of birth weight during the first 15 days of age.  Average weight gain of 4 7 ounces (120 210 mL) per week after age 45 days.  Consistent daily weight gain by age 66 days, without weight loss after the age of 2 weeks. After a feeding, your baby may spit up a small amount. This is common. BREASTFEEDING FREQUENCY AND DURATION Frequent feeding will help you make more milk and can prevent sore nipples and breast engorgement. Breastfeed when you feel the need to reduce the fullness of your breasts or when your baby shows signs of hunger. This is called "breastfeeding on demand." Avoid introducing a pacifier to your baby while you are working to establish breastfeeding (the first 4 6 weeks after your baby is born). After this time you may choose to use a pacifier. Research has shown that pacifier use during the first year of a baby's  life decreases the risk of sudden infant death syndrome (SIDS). Allow your baby to feed on each breast as long as he or she wants. Breastfeed until your baby is finished feeding. When your baby unlatches or falls asleep while feeding from the first breast, offer the second breast. Because newborns are often sleepy in the first few weeks of life, you may need to awaken your baby to get him or her to feed. Breastfeeding times will vary from baby to baby. However, the following rules can serve as a guide to help you ensure that your baby is properly fed:  Newborns (babies 104 weeks of age or younger) may breastfeed every 1 3 hours.  Newborns should not go longer than 3 hours during the day or 5 hours during the night without breastfeeding.  You should breastfeed your baby a minimum of 8 times in a 24-hour period until you begin to introduce solid foods to your baby at around 70 months of age. BREAST MILK PUMPING Pumping and storing breast milk allows you to ensure that your baby is exclusively fed your breast milk, even at times when you are unable to breastfeed. This is especially important if you are going back to work while you are still breastfeeding or when you are not able to be present during feedings. Your lactation consultant can give you guidelines on how long it is safe to store breast milk.  A breast pump is a machine that allows you to pump milk from your breast into a sterile bottle. The pumped breast milk can then be stored in a refrigerator or freezer. Some breast pumps are operated by hand, while others use electricity. Ask your lactation consultant which type will work best for you. Breast pumps can be purchased, but some hospitals and breastfeeding support groups lease breast pumps on a monthly basis. A lactation consultant can teach you how to hand express breast milk, if you prefer not to use a pump.  CARING FOR YOUR BREASTS WHILE YOU BREASTFEED Nipples can become dry, cracked, and sore  while breastfeeding. The following recommendations can help keep your breasts moisturized and healthy:  Avoid using soap on your nipples.   Wear a supportive bra. Although not required, special nursing bras and tank tops are designed to allow access to your breasts for breastfeeding without taking off your entire bra or top. Avoid  wearing underwire style bras or extremely tight bras.  Air dry your nipples for 3 42minutes after each feeding.   Use only cotton bra pads to absorb leaked breast milk. Leaking of breast milk between feedings is normal.   Use lanolin on your nipples after breastfeeding. Lanolin helps to maintain your skin's normal moisture barrier. If you use pure lanolin you do not need to wash it off before feeding your baby again. Pure lanolin is not toxic to your baby. You may also hand express a few drops of breast milk and gently massage that milk into your nipples and allow the milk to air dry. In the first few weeks after giving birth, some women experience extremely full breasts (engorgement). Engorgement can make your breasts feel heavy, warm, and tender to the touch. Engorgement peaks within 3 5 days after you give birth. The following recommendations can help ease engorgement:  Completely empty your breasts while breastfeeding or pumping. You may want to start by applying warm, moist heat (in the shower or with warm water-soaked hand towels) just before feeding or pumping. This increases circulation and helps the milk flow. If your baby does not completely empty your breasts while breastfeeding, pump any extra milk after he or she is finished.  Wear a snug bra (nursing or regular) or tank top for 1 2 days to signal your body to slightly decrease milk production.  Apply ice packs to your breasts, unless this is too uncomfortable for you.  Make sure that your baby is latched on and positioned properly while breastfeeding. If engorgement persists after 48 hours of following  these recommendations, contact your health care provider or a Science writer. OVERALL HEALTH CARE RECOMMENDATIONS WHILE BREASTFEEDING  Eat healthy foods. Alternate between meals and snacks, eating 3 of each per day. Because what you eat affects your breast milk, some of the foods may make your baby more irritable than usual. Avoid eating these foods if you are sure that they are negatively affecting your baby.  Drink milk, fruit juice, and water to satisfy your thirst (about 10 glasses a day).   Rest often, relax, and continue to take your prenatal vitamins to prevent fatigue, stress, and anemia.  Continue breast self-awareness checks.  Avoid chewing and smoking tobacco.  Avoid alcohol and drug use. Some medicines that may be harmful to your baby can pass through breast milk. It is important to ask your health care provider before taking any medicine, including all over-the-counter and prescription medicine as well as vitamin and herbal supplements. It is possible to become pregnant while breastfeeding. If birth control is desired, ask your health care provider about options that will be safe for your baby. SEEK MEDICAL CARE IF:   You feel like you want to stop breastfeeding or have become frustrated with breastfeeding.  You have painful breasts or nipples.  Your nipples are cracked or bleeding.  Your breasts are red, tender, or warm.  You have a swollen area on either breast.  You have a fever or chills.  You have nausea or vomiting.  You have drainage other than breast milk from your nipples.  Your breasts do not become full before feedings by the 5th day after you give birth.  You feel sad and depressed.  Your baby is too sleepy to eat well.  Your baby is having trouble sleeping.   Your baby is wetting less than 3 diapers in a 24-hour period.  Your baby has less than 3 stools in a  24-hour period.  Your baby's skin or the white part of his or her eyes becomes  yellow.   Your baby is not gaining weight by 77 days of age. SEEK IMMEDIATE MEDICAL CARE IF:   Your baby is overly tired (lethargic) and does not want to wake up and feed.  Your baby develops an unexplained fever. Document Released: 10/10/2005 Document Revised: 06/12/2013 Document Reviewed: 04/03/2013 [facility] Patient Information 2014 [place].

## 2014-02-26 NOTE — Progress Notes (Signed)
Reports good fm. Denies uc's, lof, vb, uti s/s.  Bump in vaginal area x 1wk, took warm baths, came to head, and popped w/ pus & blood coming out. Doesn't feel like core is gone. Area is on Rt lower vulva- still w/ firm core, unable to express any drainage out of open head, doesn't sound like/or appear to be hsv2 lesion, but cultured to check. Reports taking acyclovir as directed. Declines SVE, vtx by leopolds. Still w/ generalized itching, on palms and soles, now worse at night. Thick lotions didn't help. Didn't get bile acids last week. Has eaten this am, to come in am for fasting bile acids. Reviewed ptl s/s, fkc.  All questions answered. F/U in 1wk for visit.  GBS today.

## 2014-02-28 ENCOUNTER — Telehealth: Payer: Self-pay

## 2014-02-28 LAB — HERPES SIMPLEX VIRUS CULTURE: Organism ID, Bacteria: NOT DETECTED

## 2014-02-28 NOTE — Telephone Encounter (Signed)
Spoke with patient in regards to Disability forms and pt not eligible for pregnancy related disability. Informed pt would be glad to fill out FMLA forms verse disability. Pt states will speak with employee and contact our office back as needed.

## 2014-03-01 ENCOUNTER — Encounter (HOSPITAL_COMMUNITY): Payer: Self-pay

## 2014-03-01 ENCOUNTER — Inpatient Hospital Stay (HOSPITAL_COMMUNITY)
Admission: AD | Admit: 2014-03-01 | Discharge: 2014-03-01 | Disposition: A | Discharge status: Home / Self Care | Payer: Managed Care, Other (non HMO) | Source: Ambulatory Visit | Attending: Family Medicine | Admitting: Family Medicine

## 2014-03-01 DIAGNOSIS — Z3400 Encounter for supervision of normal first pregnancy, unspecified trimester: Secondary | ICD-10-CM

## 2014-03-01 DIAGNOSIS — O479 False labor, unspecified: Secondary | ICD-10-CM | POA: Insufficient documentation

## 2014-03-01 HISTORY — DX: Herpesviral infection, unspecified: B00.9

## 2014-03-01 LAB — CULTURE, STREPTOCOCCUS GRP B W/SUSCEPT

## 2014-03-01 NOTE — MAU Note (Signed)
Contractions 3-5 min today, worse since last night.  Last cervix check 1.5 cm.  No bleeding.  Yellowish mucus.  Baby moving well per pt.

## 2014-03-03 ENCOUNTER — Encounter: Payer: Self-pay | Admitting: Women's Health

## 2014-03-06 ENCOUNTER — Encounter: Payer: Self-pay | Admitting: Advanced Practice Midwife

## 2014-03-06 ENCOUNTER — Ambulatory Visit (INDEPENDENT_AMBULATORY_CARE_PROVIDER_SITE_OTHER): Payer: Self-pay | Admitting: Advanced Practice Midwife

## 2014-03-06 VITALS — BP 127/84 | Wt 127.0 lb

## 2014-03-06 DIAGNOSIS — O26849 Uterine size-date discrepancy, unspecified trimester: Secondary | ICD-10-CM

## 2014-03-06 DIAGNOSIS — L299 Pruritus, unspecified: Secondary | ICD-10-CM

## 2014-03-06 DIAGNOSIS — Z331 Pregnant state, incidental: Secondary | ICD-10-CM

## 2014-03-06 DIAGNOSIS — Z3400 Encounter for supervision of normal first pregnancy, unspecified trimester: Secondary | ICD-10-CM

## 2014-03-06 DIAGNOSIS — O26613 Liver and biliary tract disorders in pregnancy, third trimester: Secondary | ICD-10-CM

## 2014-03-06 DIAGNOSIS — Z1389 Encounter for screening for other disorder: Secondary | ICD-10-CM

## 2014-03-06 DIAGNOSIS — K831 Obstruction of bile duct: Secondary | ICD-10-CM

## 2014-03-06 LAB — POCT URINALYSIS DIPSTICK
Blood, UA: NEGATIVE
Glucose, UA: NEGATIVE
KETONES UA: NEGATIVE
LEUKOCYTES UA: NEGATIVE
Nitrite, UA: NEGATIVE

## 2014-03-06 NOTE — Progress Notes (Signed)
Still has generalized itching.  Biles acids drawn today (turnaround time 5 days). Size <dates.  Will check EFW.  Fetus active.    Routine questions about pregnancy answered.

## 2014-03-07 ENCOUNTER — Encounter (HOSPITAL_COMMUNITY): Payer: Self-pay | Admitting: Obstetrics

## 2014-03-07 ENCOUNTER — Inpatient Hospital Stay (HOSPITAL_COMMUNITY)
Admission: RE | Admit: 2014-03-07 | Discharge: 2014-03-10 | DRG: 775 | Disposition: A | Discharge status: Home / Self Care | Payer: Managed Care, Other (non HMO) | Source: Ambulatory Visit | Attending: Obstetrics and Gynecology | Admitting: Obstetrics and Gynecology

## 2014-03-07 DIAGNOSIS — Z87891 Personal history of nicotine dependence: Secondary | ICD-10-CM

## 2014-03-07 DIAGNOSIS — K831 Obstruction of bile duct: Secondary | ICD-10-CM | POA: Insufficient documentation

## 2014-03-07 DIAGNOSIS — O26613 Liver and biliary tract disorders in pregnancy, third trimester: Secondary | ICD-10-CM

## 2014-03-07 DIAGNOSIS — Z8249 Family history of ischemic heart disease and other diseases of the circulatory system: Secondary | ICD-10-CM

## 2014-03-07 DIAGNOSIS — O26619 Liver and biliary tract disorders in pregnancy, unspecified trimester: Principal | ICD-10-CM | POA: Diagnosis present

## 2014-03-07 DIAGNOSIS — K838 Other specified diseases of biliary tract: Secondary | ICD-10-CM | POA: Diagnosis present

## 2014-03-07 DIAGNOSIS — Z3400 Encounter for supervision of normal first pregnancy, unspecified trimester: Secondary | ICD-10-CM

## 2014-03-07 DIAGNOSIS — O26643 Intrahepatic cholestasis of pregnancy, third trimester: Secondary | ICD-10-CM

## 2014-03-07 DIAGNOSIS — O328XX0 Maternal care for other malpresentation of fetus, not applicable or unspecified: Secondary | ICD-10-CM | POA: Diagnosis present

## 2014-03-07 LAB — CBC
HCT: 37.4 % (ref 36.0–46.0)
Hemoglobin: 12.5 g/dL (ref 12.0–15.0)
MCH: 27.4 pg (ref 26.0–34.0)
MCHC: 33.4 g/dL (ref 30.0–36.0)
MCV: 81.8 fL (ref 78.0–100.0)
PLATELETS: 259 10*3/uL (ref 150–400)
RBC: 4.57 MIL/uL (ref 3.87–5.11)
RDW: 17.3 % — AB (ref 11.5–15.5)
WBC: 6.6 10*3/uL (ref 4.0–10.5)

## 2014-03-07 LAB — BILE ACIDS, TOTAL: Bile Acids Total: 20 umol/L — ABNORMAL HIGH (ref 0–19)

## 2014-03-07 MED ORDER — OXYCODONE-ACETAMINOPHEN 5-325 MG PO TABS: 1.0000 | ORAL_TABLET | ORAL | Status: DC | PRN

## 2014-03-07 MED ORDER — IBUPROFEN 600 MG PO TABS: 600.0000 mg | ORAL_TABLET | Freq: Four times a day (QID) | ORAL | Status: DC | PRN

## 2014-03-07 MED ORDER — OXYTOCIN BOLUS FROM INFUSION
500.0000 mL | INTRAVENOUS | Status: DC
  Administered 2014-03-08: 500 mL via INTRAVENOUS

## 2014-03-07 MED ORDER — CITRIC ACID-SODIUM CITRATE 334-500 MG/5ML PO SOLN: 30.0000 mL | ORAL | Status: DC | PRN

## 2014-03-07 MED ORDER — OXYTOCIN 40 UNITS IN LACTATED RINGERS INFUSION - SIMPLE MED
62.5000 mL/h | INTRAVENOUS | Status: DC
  Filled 2014-03-07: qty 1000

## 2014-03-07 MED ORDER — FENTANYL CITRATE 0.05 MG/ML IJ SOLN
100.0000 ug | INTRAMUSCULAR | Status: DC | PRN
  Administered 2014-03-07: 100 ug via INTRAVENOUS
  Filled 2014-03-07: qty 2

## 2014-03-07 MED ORDER — LACTATED RINGERS IV SOLN: 500.0000 mL | INTRAVENOUS | Status: DC | PRN

## 2014-03-07 MED ORDER — ACETAMINOPHEN 325 MG PO TABS: 650.0000 mg | ORAL_TABLET | ORAL | Status: DC | PRN

## 2014-03-07 MED ORDER — LACTATED RINGERS IV SOLN
INTRAVENOUS | Status: DC
  Administered 2014-03-07 – 2014-03-08 (×3): via INTRAVENOUS

## 2014-03-07 MED ORDER — ZOLPIDEM TARTRATE 5 MG PO TABS
5.0000 mg | ORAL_TABLET | Freq: Every day | ORAL | Status: DC
  Filled 2014-03-07: qty 1

## 2014-03-07 MED ORDER — ONDANSETRON HCL 4 MG/2ML IJ SOLN: 4.0000 mg | Freq: Four times a day (QID) | INTRAMUSCULAR | Status: DC | PRN

## 2014-03-07 MED ORDER — LIDOCAINE HCL (PF) 1 % IJ SOLN
30.0000 mL | INTRAMUSCULAR | Status: DC | PRN
  Filled 2014-03-07: qty 30

## 2014-03-07 NOTE — Progress Notes (Signed)
Pt notified of elevated Bile acids (20). She is to go to [facility] to start IOL process. Dr. Glo Herring notified.

## 2014-03-08 ENCOUNTER — Inpatient Hospital Stay (HOSPITAL_COMMUNITY): Payer: Managed Care, Other (non HMO) | Admitting: Anesthesiology

## 2014-03-08 ENCOUNTER — Encounter (HOSPITAL_COMMUNITY): Payer: Managed Care, Other (non HMO) | Admitting: Anesthesiology

## 2014-03-08 ENCOUNTER — Encounter (HOSPITAL_COMMUNITY): Payer: Self-pay | Admitting: Anesthesiology

## 2014-03-08 DIAGNOSIS — K831 Obstruction of bile duct: Secondary | ICD-10-CM

## 2014-03-08 DIAGNOSIS — K838 Other specified diseases of biliary tract: Secondary | ICD-10-CM

## 2014-03-08 DIAGNOSIS — O26619 Liver and biliary tract disorders in pregnancy, unspecified trimester: Secondary | ICD-10-CM

## 2014-03-08 LAB — COMPREHENSIVE METABOLIC PANEL
ALK PHOS: 291 U/L — AB (ref 39–117)
ALT: 34 U/L (ref 0–35)
AST: 33 U/L (ref 0–37)
Albumin: 2.6 g/dL — ABNORMAL LOW (ref 3.5–5.2)
BUN: 14 mg/dL (ref 6–23)
CO2: 21 mEq/L (ref 19–32)
Calcium: 9.8 mg/dL (ref 8.4–10.5)
Chloride: 101 mEq/L (ref 96–112)
Creatinine, Ser: 0.92 mg/dL (ref 0.50–1.10)
GFR calc Af Amer: >90 mL/min (ref >90)
GFR calc non Af Amer: 87 mL/min — ABNORMAL LOW (ref >90)
Glucose, Bld: 80 mg/dL (ref 70–99)
Potassium: 4.5 mEq/L (ref 3.7–5.3)
SODIUM: 137 meq/L (ref 137–147)
TOTAL PROTEIN: 6.8 g/dL (ref 6.0–8.3)
Total Bilirubin: 1.7 mg/dL — ABNORMAL HIGH (ref 0.3–1.2)

## 2014-03-08 LAB — PROTEIN / CREATININE RATIO, URINE
CREATININE, URINE: 87.27 mg/dL
Protein Creatinine Ratio: 0.51 — ABNORMAL HIGH (ref 0.00–0.15)
Total Protein, Urine: 44.7 mg/dL

## 2014-03-08 LAB — TYPE AND SCREEN
ABO/RH(D): O POS
ANTIBODY SCREEN: NEGATIVE

## 2014-03-08 LAB — RPR

## 2014-03-08 MED ORDER — OXYTOCIN 40 UNITS IN LACTATED RINGERS INFUSION - SIMPLE MED
62.5000 mL/h | INTRAVENOUS | Status: DC | PRN
Start: 2014-03-08 — End: 2014-03-10

## 2014-03-08 MED ORDER — MEASLES, MUMPS & RUBELLA VAC ~~LOC~~ INJ: 0.5000 mL | INJECTION | Freq: Once | SUBCUTANEOUS | Status: DC

## 2014-03-08 MED ORDER — ZOLPIDEM TARTRATE 5 MG PO TABS
5.0000 mg | ORAL_TABLET | Freq: Every evening | ORAL | Status: DC | PRN
  Administered 2014-03-08: 5 mg via ORAL

## 2014-03-08 MED ORDER — WITCH HAZEL-GLYCERIN EX PADS: 1.0000 "application " | MEDICATED_PAD | CUTANEOUS | Status: DC | PRN

## 2014-03-08 MED ORDER — TETANUS-DIPHTH-ACELL PERTUSSIS 5-2.5-18.5 LF-MCG/0.5 IM SUSP: 0.5000 mL | Freq: Once | INTRAMUSCULAR | Status: DC

## 2014-03-08 MED ORDER — BUPIVACAINE HCL (PF) 0.25 % IJ SOLN
INTRAMUSCULAR | Status: DC | PRN
  Administered 2014-03-08: 10 mL via EPIDURAL

## 2014-03-08 MED ORDER — DIPHENHYDRAMINE HCL 25 MG PO CAPS: 25.0000 mg | ORAL_CAPSULE | Freq: Four times a day (QID) | ORAL | Status: DC | PRN

## 2014-03-08 MED ORDER — SODIUM CHLORIDE 0.9 % IJ SOLN: 3.0000 mL | INTRAMUSCULAR | Status: DC | PRN

## 2014-03-08 MED ORDER — SIMETHICONE 80 MG PO CHEW: 80.0000 mg | CHEWABLE_TABLET | ORAL | Status: DC | PRN

## 2014-03-08 MED ORDER — LIDOCAINE HCL (PF) 1 % IJ SOLN
INTRAMUSCULAR | Status: DC | PRN
  Administered 2014-03-08 (×2): 4 mL

## 2014-03-08 MED ORDER — FLEET ENEMA 7-19 GM/118ML RE ENEM: 1.0000 | ENEMA | Freq: Every day | RECTAL | Status: DC | PRN

## 2014-03-08 MED ORDER — ONDANSETRON HCL 4 MG/2ML IJ SOLN: 4.0000 mg | INTRAMUSCULAR | Status: DC | PRN

## 2014-03-08 MED ORDER — FENTANYL 2.5 MCG/ML BUPIVACAINE 1/10 % EPIDURAL INFUSION (WH - ANES)
14.0000 mL/h | INTRAMUSCULAR | Status: DC | PRN
  Administered 2014-03-08: 12 mL/h via EPIDURAL
  Filled 2014-03-08 (×2): qty 125

## 2014-03-08 MED ORDER — EPHEDRINE 5 MG/ML INJ: 10.0000 mg | INTRAVENOUS | Status: DC | PRN

## 2014-03-08 MED ORDER — OXYCODONE-ACETAMINOPHEN 5-325 MG PO TABS
1.0000 | ORAL_TABLET | ORAL | Status: DC | PRN
  Administered 2014-03-09 – 2014-03-10 (×2): 1 via ORAL
  Filled 2014-03-08 (×2): qty 1

## 2014-03-08 MED ORDER — PHENYLEPHRINE 40 MCG/ML (10ML) SYRINGE FOR IV PUSH (FOR BLOOD PRESSURE SUPPORT)
80.0000 ug | PREFILLED_SYRINGE | INTRAVENOUS | Status: DC | PRN
  Filled 2014-03-08: qty 10

## 2014-03-08 MED ORDER — DIBUCAINE 1 % RE OINT
1.0000 "application " | TOPICAL_OINTMENT | RECTAL | Status: DC | PRN
  Filled 2014-03-08: qty 28

## 2014-03-08 MED ORDER — SENNOSIDES-DOCUSATE SODIUM 8.6-50 MG PO TABS
2.0000 | ORAL_TABLET | ORAL | Status: DC
  Administered 2014-03-08 – 2014-03-09 (×2): 2 via ORAL
  Filled 2014-03-08 (×2): qty 2

## 2014-03-08 MED ORDER — TERBUTALINE SULFATE 1 MG/ML IJ SOLN: 0.2500 mg | Freq: Once | INTRAMUSCULAR | Status: DC | PRN

## 2014-03-08 MED ORDER — SODIUM CHLORIDE 0.9 % IV SOLN: 250.0000 mL | INTRAVENOUS | Status: DC | PRN

## 2014-03-08 MED ORDER — FENTANYL 2.5 MCG/ML BUPIVACAINE 1/10 % EPIDURAL INFUSION (WH - ANES)
INTRAMUSCULAR | Status: DC | PRN
  Administered 2014-03-08: 12 mL/h via EPIDURAL

## 2014-03-08 MED ORDER — BENZOCAINE-MENTHOL 20-0.5 % EX AERO
1.0000 "application " | INHALATION_SPRAY | CUTANEOUS | Status: DC | PRN
  Filled 2014-03-08: qty 56

## 2014-03-08 MED ORDER — ZOLPIDEM TARTRATE 5 MG PO TABS: 5.0000 mg | ORAL_TABLET | Freq: Every evening | ORAL | Status: DC | PRN

## 2014-03-08 MED ORDER — BISACODYL 10 MG RE SUPP: 10.0000 mg | Freq: Every day | RECTAL | Status: DC | PRN

## 2014-03-08 MED ORDER — PRENATAL MULTIVITAMIN CH
1.0000 | ORAL_TABLET | Freq: Every day | ORAL | Status: DC
Start: 2014-03-09 — End: 2014-03-10
  Administered 2014-03-09: 1 via ORAL
  Filled 2014-03-08: qty 1

## 2014-03-08 MED ORDER — LACTATED RINGERS IV SOLN
500.0000 mL | Freq: Once | INTRAVENOUS | Status: AC
  Administered 2014-03-08: 500 mL via INTRAVENOUS

## 2014-03-08 MED ORDER — IBUPROFEN 600 MG PO TABS
600.0000 mg | ORAL_TABLET | Freq: Four times a day (QID) | ORAL | Status: DC
Start: 2014-03-08 — End: 2014-03-10
  Administered 2014-03-08 – 2014-03-10 (×6): 600 mg via ORAL
  Filled 2014-03-08 (×6): qty 1

## 2014-03-08 MED ORDER — ONDANSETRON HCL 4 MG PO TABS: 4.0000 mg | ORAL_TABLET | ORAL | Status: DC | PRN

## 2014-03-08 MED ORDER — SODIUM CHLORIDE 0.9 % IJ SOLN: 3.0000 mL | Freq: Two times a day (BID) | INTRAMUSCULAR | Status: DC

## 2014-03-08 MED ORDER — DIPHENHYDRAMINE HCL 50 MG/ML IJ SOLN: 12.5000 mg | INTRAMUSCULAR | Status: DC | PRN

## 2014-03-08 MED ORDER — LANOLIN HYDROUS EX OINT: TOPICAL_OINTMENT | CUTANEOUS | Status: DC | PRN

## 2014-03-08 MED ORDER — PHENYLEPHRINE 40 MCG/ML (10ML) SYRINGE FOR IV PUSH (FOR BLOOD PRESSURE SUPPORT): 80.0000 ug | PREFILLED_SYRINGE | INTRAVENOUS | Status: DC | PRN

## 2014-03-08 MED ORDER — OXYTOCIN 40 UNITS IN LACTATED RINGERS INFUSION - SIMPLE MED
1.0000 m[IU]/min | INTRAVENOUS | Status: DC
  Administered 2014-03-08: 2 m[IU]/min via INTRAVENOUS
  Administered 2014-03-08: 8 m[IU]/min via INTRAVENOUS
  Administered 2014-03-08: 4 m[IU]/min via INTRAVENOUS
  Administered 2014-03-08: 6 m[IU]/min via INTRAVENOUS

## 2014-03-08 MED ORDER — EPHEDRINE 5 MG/ML INJ
10.0000 mg | INTRAVENOUS | Status: DC | PRN
  Filled 2014-03-08: qty 4

## 2014-03-08 NOTE — Progress Notes (Signed)
Patient ID: Alicia [name], female   DOB: 1991/01/19, 23 y.o.   MRN: [id number] Alicia [name] is a 23 y.o. G1P0 at [redacted]w[redacted]d admitted for induction of labor due to cholestasis of pregnancy.  Subjective: Comfortable w/ epidural, no complaints, requests AROM  Objective: BP 136/97  Pulse 104  Temp(Src) 98.1 F (36.7 C) (Oral)  Resp 18  Ht 5\' 2"  (1.575 m)  Wt 57.607 kg (127 lb)  BMI 23.22 kg/m2  SpO2 96%  LMP 06/08/2013    FHT:  FHR: 120 bpm, variability: moderate,  accelerations:  Present,  decelerations:  Absent UC:   regular, every 2-5 minutes  SVE:   Dilation: 9 Effacement (%): 100 Station: +1 Exam by:: Doree Fudge, CNM, more cx on maternal RT Pos scalp stim  AROM small amount clear fluid  Labs: Lab Results  Component Value Date   WBC 6.6 03/07/2014   HGB 12.5 03/07/2014   HCT 37.4 03/07/2014   MCV 81.8 03/07/2014   PLT 259 03/07/2014    Assessment / Plan: IOL d/t cholestasis of pregnancy, progressing well spontaneously, now arom'd, more cx on Rt, will place in Rt lateral to try to resolve  Labor: Progressing normally Fetal Wellbeing:  Category I Pain Control:  Epidural Pre-eclampsia: mild, elevated P:C ratio, few elevated diastolic bp's, no s/s I/D:  n/a Anticipated MOD:  NSVD  Hale Drone Ramey Schiff CNM, WHNP-BC 03/08/2014, 9:53 AM

## 2014-03-08 NOTE — Anesthesia Preprocedure Evaluation (Signed)
Anesthesia Evaluation  Patient identified by MRN, date of birth, ID band Patient awake    Reviewed: Allergy & Precautions, H&P , Patient's Chart, lab work & pertinent test results  Airway Mallampati: II TM Distance: >3 FB Neck ROM: Full    Dental no notable dental hx. (+) Teeth Intact   Pulmonary former smoker,  breath sounds clear to auscultation  Pulmonary exam normal       Cardiovascular negative cardio ROS  Rhythm:Regular Rate:Normal     Neuro/Psych negative neurological ROS  negative psych ROS   GI/Hepatic GERD-  Medicated and Controlled,Cholestasis of pregnancy   Endo/Other  negative endocrine ROS  Renal/GU negative Renal ROS  negative genitourinary   Musculoskeletal negative musculoskeletal ROS (+)   Abdominal   Peds  Hematology negative hematology ROS (+)   Anesthesia Other Findings   Reproductive/Obstetrics (+) Pregnancy HSV                           Anesthesia Physical Anesthesia Plan  ASA: II  Anesthesia Plan: Epidural   Post-op Pain Management:    Induction:   Airway Management Planned: Natural Airway  Additional Equipment:   Intra-op Plan:   Post-operative Plan:   Informed Consent: I have reviewed the patients History and Physical, chart, labs and discussed the procedure including the risks, benefits and alternatives for the proposed anesthesia with the patient or authorized representative who has indicated his/her understanding and acceptance.     Plan Discussed with: Anesthesiologist  Anesthesia Plan Comments:         Anesthesia Quick Evaluation

## 2014-03-08 NOTE — H&P (Signed)
Alicia [name] is a 23 y.o. female G1 @ 38.1wks by LMP and confirmed by 1st tri U/S presenting for IOL due to cholestasis. She has had generalized itching, specifically on palms and soles, that has become worse over time. Denies leaking or bldg. Reports +FM. 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  History OB History   Grav Para Term Preterm Abortions TAB SAB Ect Mult Living   1         0     Past Medical History  Diagnosis Date  . Urinary tract infection   . HSV-2 infection    Past Surgical History  Procedure Laterality Date  . No past surgeries     Family History: family history includes Anemia in her mother; Cancer in her maternal grandmother; Hypertension in her mother. Social History:  reports that she has quit smoking. Her smoking use included Cigarettes. She has a .012 pack-year smoking history. She has never used smokeless tobacco. She reports that she does not drink alcohol or use illicit drugs.   Prenatal Transfer Tool  Maternal Diabetes: No 2 hr GTT high nl, but was negative Genetic Screening: Normal Maternal Ultrasounds/Referrals: Normal Fetal Ultrasounds or other Referrals:  None Maternal Substance Abuse:  No Significant Maternal Medications:  Meds include: Other: Acyclovir Significant Maternal Lab Results:  Lab values include: Group B Strep negative Other Comments:  None  ROS  Dilation: 2 Effacement (%): 70 Station: -1 Exam by:: K Shaw CNM Blood pressure 133/89, pulse 108, temperature 98.8 F (37.1 C), temperature source Oral, resp. rate 18, height 5\' 2"  (1.575 m), weight 57.607 kg (127 lb), last menstrual period 06/08/2013. Exam Physical Exam  Constitutional: She is oriented to person, place, and time. She appears well-developed.  HENT:  Head: Normocephalic.   Cardiovascular: Normal rate.   Respiratory: Effort normal.  GI:  EFM 125-130s, +accels, no decels Irreg mild ctx  Genitourinary: Vagina normal.  Musculoskeletal: Normal range of motion.  Neurological: She is alert and oriented to person, place, and time.  Skin: Skin is warm and dry.  Some areas of erythema from itching, esp on hands  Psychiatric: She has a normal mood and affect. Her behavior is normal. Thought content normal.    Prenatal labs: ABO, Rh: O/POS/-- (10/08 1202) Antibody: NEG (03/04 0921) Rubella: 9.29 (10/08 1202) RPR: NON REAC (03/04 0921)  HBsAg: NEGATIVE (10/08 1202)  HIV: NON REACTIVE (03/04 0921)  GBS: Negative (05/06 0000)   Assessment/Plan: IUP at 38.1wks Cholestasis at term Favorable cx  Admit to Birthing Suites Foley bulb inserted without difficulty Rev'd IOL process Anticipate SVD    Myrtis Ser CNM 03/08/2014, 1:46 AM

## 2014-03-08 NOTE — Anesthesia Procedure Notes (Signed)
Epidural Patient location during procedure: OB Start time: 03/08/2014 2:58 AM  Staffing Anesthesiologist: Xela Oregel A. Performed by: anesthesiologist   Preanesthetic Checklist Completed: patient identified, site marked, surgical consent, pre-op evaluation, timeout performed, IV checked, risks and benefits discussed and monitors and equipment checked  Epidural Patient position: sitting Prep: site prepped and draped and DuraPrep Patient monitoring: continuous pulse ox and blood pressure Approach: midline Location: L3-L4 Injection technique: LOR air  Needle:  Needle type: Tuohy  Needle gauge: 17 G Needle length: 9 cm and 9 Needle insertion depth: 4 cm Catheter type: closed end flexible Catheter size: 19 Gauge Catheter at skin depth: 9 cm Test dose: negative and Other  Assessment Events: blood not aspirated, injection not painful, no injection resistance, negative IV test and no paresthesia  Additional Notes Patient identified. Risks and benefits discussed including failed block, incomplete  Pain control, post dural puncture headache, nerve damage, paralysis, blood pressure Changes, nausea, vomiting, reactions to medications-both toxic and allergic and post Partum back pain. All questions were answered. Patient expressed understanding and wished to proceed. Sterile technique was used throughout procedure. Epidural site was Dressed with sterile barrier dressing. No paresthesias, signs of intravascular injection Or signs of intrathecal spread were encountered.  Patient was more comfortable after the epidural was dosed. Please see RN's note for documentation of vital signs and FHR which are stable.

## 2014-03-09 LAB — ABO/RH: ABO/RH(D): O POS

## 2014-03-09 MED ORDER — GLYCOPYRROLATE 1 MG PO TABS
2.0000 mg | ORAL_TABLET | Freq: Two times a day (BID) | ORAL | Status: DC
  Administered 2014-03-09 (×3): 2 mg via ORAL
  Filled 2014-03-09 (×4): qty 2

## 2014-03-09 NOTE — Progress Notes (Signed)
Post Partum Day 1 Subjective: no complaints, voiding and + flatus  Objective: Blood pressure 115/79, pulse 80, temperature 98.3 F (36.8 C), temperature source Oral, resp. rate 20, height 5\' 2"  (1.575 m), weight 57.607 kg (127 lb), last menstrual period 06/08/2013, SpO2 96.00%, unknown if currently breastfeeding.  Physical Exam:  General: alert, cooperative and no distress Lochia: appropriate Uterine Fundus: firm DVT Evaluation: No evidence of DVT seen on physical exam.   Recent Labs  03/07/14 2330  HGB 12.5  HCT 37.4    Assessment/Plan: Plan for discharge tomorrow and Contraception depo, bottle feeding Continue normal post-partum care.   LOS: 2 days   Alicia [name] 03/09/2014, 7:44 AM

## 2014-03-09 NOTE — Progress Notes (Signed)
Clinical Social Work Department PSYCHOSOCIAL ASSESSMENT - MATERNAL/CHILD 03/09/2014  Patient:  CIAIRA, NATIVIDAD  Account Number:  [id number]  Admit Date:  03/07/2014  Ardine Eng Name:   Gwenyth Allegra    Clinical Social Worker:  Dinero Chavira, LCSW   Date/Time:  03/09/2014 02:30 PM  Date Referred:  03/09/2014   Referral source  Central Nursery     Referred reason  Substance Abuse   Other referral source:    I:  FAMILY / [place] legal guardian:  PARENT  Guardian - Name Guardian - Age [place] [address], [place] [address]  k     Other household support members/support persons Other support:    II  PSYCHOSOCIAL DATA Information Source:    Occupational hygienist Employment:   Financial resources:   If Medicaid - County:    School / Grade:   Maternity Care Coordinator / Child Services Coordination / Early Interventions:  Cultural issues impacting care:    III  STRENGTHS  Strength comment:    IV  RISK FACTORS AND CURRENT PROBLEMS Current Problem:       V  SOCIAL WORK ASSESSMENT Acknowledged Social Work consult to assess mother's history of marijuana.  Mother was receptive to social work intervention.  Informed that she and FOB are still in a relationship and he is very supportive.  She resides with maternal grandparents.  Informed that FOB is currently unemployed.  Informed that he was recently released from jail for driving with a suspended license.    Mother states that she tried marijuana in the past and but stopped completely once she found out about the pregnancy.  She denies any need for treatment.     Mother informed of the hospitals drug screen policy.    She denies any hx of mental illness.  Informed her of CSW availability.      VI SOCIAL WORK PLAN Social Work Plan  No Barriers to Discharge

## 2014-03-09 NOTE — Anesthesia Postprocedure Evaluation (Signed)
Anesthesia Post Note  Patient: Alicia [name]  Procedure(s) Performed: * No procedures listed *  Anesthesia type: Epidural  Patient location: Mother/Baby  Post pain: Pain level controlled  Post assessment: Post-op Vital signs reviewed  Last Vitals:  Filed Vitals:   03/09/14 0749  BP: 119/73  Pulse: 74  Temp: 36.8 C  Resp: 18    Post vital signs: Reviewed  Level of consciousness:alert  Complications: No apparent anesthesia complications

## 2014-03-09 NOTE — Progress Notes (Signed)
I spoke with and examined patient and agree with resident/PA/SNM's note and plan of care.  Continues to itch, can do prn benadryl.  Roma Schanz, CNM, [facility] 03/09/2014 8:29 AM

## 2014-03-09 NOTE — Lactation Note (Signed)
This note was copied from the chart of [place]. Lactation Consultation Note  Patient Name: Alicia [name] [id number] Date: 03/09/2014 Reason for consult: Other (Comment) (charting for exclusion)   Maternal Data Formula Feeding for Exclusion: Yes Reason for exclusion: Mother's choice to formula feed on admision  Feeding Feeding Type: Bottle Fed - Formula Nipple Type: Slow - flow  LATCH Score/Interventions                      Lactation Tools Discussed/Used     Consult Status Consult Status: Complete    Landis Gandy 03/09/2014, 3:35 PM

## 2014-03-10 ENCOUNTER — Ambulatory Visit: Payer: Managed Care, Other (non HMO)

## 2014-03-10 ENCOUNTER — Encounter: Payer: Self-pay | Admitting: *Deleted

## 2014-03-10 MED ORDER — IBUPROFEN 600 MG PO TABS: 600.0000 mg | ORAL_TABLET | Freq: Four times a day (QID) | ORAL | Status: DC

## 2014-03-10 MED ORDER — OXYCODONE-ACETAMINOPHEN 5-325 MG PO TABS: 1.0000 | ORAL_TABLET | ORAL | Status: DC | PRN

## 2014-03-10 NOTE — Discharge Summary (Signed)
Obstetric Discharge Summary Reason for Admission: induction of labor Prenatal Procedures: ultrasound Intrapartum Procedures: spontaneous vaginal delivery Postpartum Procedures: none Complications-Operative and Postpartum: none Hemoglobin  Date Value Ref Range Status  03/07/2014 12.5  12.0 - 15.0 g/dL Final     HCT  Date Value Ref Range Status  03/07/2014 37.4  36.0 - 46.0 % Final    Physical Exam:  General: alert, cooperative, appears stated age and no distress Lochia: appropriate Uterine Fundus: firm Incision: n/a  DVT Evaluation: No evidence of DVT seen on physical exam. Negative Homan's sign. No cords or calf tenderness. No significant calf/ankle edema.  Discharge Diagnoses: Term Pregnancy-delivered  Discharge Information: Date: 03/10/2014 Activity: pelvic rest Diet: routine Medications: PNV, Ibuprofen and Percocet Condition: stable and improved Instructions: refer to practice specific booklet Discharge to: home   Newborn Data: Live born female  Birth Weight: 5 lb 11.9 oz (2605 g) APGAR: 8, 9  Home with mother.  Abbott Pao 03/10/2014, 7:41 AM

## 2014-03-10 NOTE — Discharge Summary (Signed)
Attestation of Attending Supervision of Advanced Practitioner (CNM/NP): Evaluation and management procedures were performed by the Advanced Practitioner under my supervision and collaboration.  I have reviewed the Advanced Practitioner's note and chart, and I agree with the management and plan.  Tayron Hunnell 03/10/2014 8:02 AM

## 2014-03-11 ENCOUNTER — Telehealth: Payer: Self-pay | Admitting: *Deleted

## 2014-03-11 NOTE — Telephone Encounter (Signed)
Langley Gauss from the [facility] Department state patient's HGB today 9. Pt encouraged to continue her PNV.

## 2014-03-12 NOTE — Telephone Encounter (Signed)
Pt informed to continue PNV and take OTC iron supplement due to HGB of 9.

## 2014-03-14 DIAGNOSIS — Z029 Encounter for administrative examinations, unspecified: Secondary | ICD-10-CM

## 2014-04-07 ENCOUNTER — Ambulatory Visit: Payer: Managed Care, Other (non HMO)

## 2014-04-07 ENCOUNTER — Ambulatory Visit (INDEPENDENT_AMBULATORY_CARE_PROVIDER_SITE_OTHER): Payer: Managed Care, Other (non HMO) | Admitting: Women's Health

## 2014-04-07 ENCOUNTER — Encounter: Payer: Self-pay | Admitting: Women's Health

## 2014-04-07 VITALS — BP 118/58 | Ht 62.0 in | Wt 119.0 lb

## 2014-04-07 DIAGNOSIS — Z3400 Encounter for supervision of normal first pregnancy, unspecified trimester: Secondary | ICD-10-CM

## 2014-04-07 DIAGNOSIS — Z3202 Encounter for pregnancy test, result negative: Secondary | ICD-10-CM

## 2014-04-07 LAB — POCT URINE PREGNANCY: PREG TEST UR: NEGATIVE

## 2014-04-07 MED ORDER — MEDROXYPROGESTERONE ACETATE 150 MG/ML IM SUSP: 150.0000 mg | INTRAMUSCULAR | Status: DC

## 2014-04-07 NOTE — Patient Instructions (Signed)
Constipation  Drink plenty of fluid, preferably water, throughout the day  Eat foods high in fiber such as fruits, vegetables, and grains  Exercise, such as walking, is a good way to keep your bowels regular  Drink warm fluids, especially warm prune juice, or decaf coffee  Eat a 1/2 cup of real oatmeal (not instant), 1/2 cup applesauce, and 1/2-1 cup warm prune juice every day  If needed, you may take Colace (docusate sodium) stool softener once or twice a day to help keep the stool soft. If you are pregnant, wait until you are out of your first trimester (12-14 weeks of pregnancy)  If you still are having problems with constipation, you may take Miralax once daily as needed to help keep your bowels regular.  If you are pregnant, wait until you are out of your first trimester (12-14 weeks of pregnancy)  Medroxyprogesterone injection [Contraceptive] What is this medicine? MEDROXYPROGESTERONE (me DROX ee proe JES te rone) contraceptive injections prevent pregnancy. They provide effective birth control for 3 months. Depo-subQ Provera 104 is also used for treating pain related to endometriosis. This medicine may be used for other purposes; ask your health care provider or pharmacist if you have questions. COMMON BRAND NAME(S): Depo-Provera, Depo-subQ Provera 104 What should I tell my health care provider before I take this medicine? They need to know if you have any of these conditions: -frequently drink alcohol -asthma -blood vessel disease or a history of a blood clot in the lungs or legs -bone disease such as osteoporosis -breast cancer -diabetes -eating disorder (anorexia nervosa or bulimia) -high blood pressure -HIV infection or AIDS -kidney disease -liver disease -mental depression -migraine -seizures (convulsions) -stroke -tobacco smoker -vaginal bleeding -an unusual or allergic reaction to medroxyprogesterone, other hormones, medicines, foods, dyes, or  preservatives -pregnant or trying to get pregnant -breast-feeding How should I use this medicine? Depo-Provera Contraceptive injection is given into a muscle. Depo-subQ Provera 104 injection is given under the skin. These injections are given by a health care professional. You must not be pregnant before getting an injection. The injection is usually given during the first 5 days after the start of a menstrual period or 6 weeks after delivery of a baby. Talk to your pediatrician regarding the use of this medicine in children. Special care may be needed. These injections have been used in female children who have started having menstrual periods. Overdosage: If you think you have taken too much of this medicine contact a poison control center or emergency room at once. NOTE: This medicine is only for you. Do not share this medicine with others. What if I miss a dose? Try not to miss a dose. You must get an injection once every 3 months to maintain birth control. If you cannot keep an appointment, call and reschedule it. If you wait longer than 13 weeks between Depo-Provera contraceptive injections or longer than 14 weeks between Depo-subQ Provera 104 injections, you could get pregnant. Use another method for birth control if you miss your appointment. You may also need a pregnancy test before receiving another injection. What may interact with this medicine? Do not take this medicine with any of the following medications: -bosentan This medicine may also interact with the following medications: -aminoglutethimide -antibiotics or medicines for infections, especially rifampin, rifabutin, rifapentine, and griseofulvin -aprepitant -barbiturate medicines such as phenobarbital or primidone -bexarotene -carbamazepine -medicines for seizures like ethotoin, felbamate, oxcarbazepine, phenytoin, topiramate -modafinil -St. John's wort This list may not describe all possible interactions.  Give your health  care provider a list of all the medicines, herbs, non-prescription drugs, or dietary supplements you use. Also tell them if you smoke, drink alcohol, or use illegal drugs. Some items may interact with your medicine. What should I watch for while using this medicine? This drug does not protect you against HIV infection (AIDS) or other sexually transmitted diseases. Use of this product may cause you to lose calcium from your bones. Loss of calcium may cause weak bones (osteoporosis). Only use this product for more than 2 years if other forms of birth control are not right for you. The longer you use this product for birth control the more likely you will be at risk for weak bones. Ask your health care professional how you can keep strong bones. You may have a change in bleeding pattern or irregular periods. Many females stop having periods while taking this drug. If you have received your injections on time, your chance of being pregnant is very low. If you think you may be pregnant, see your health care professional as soon as possible. Tell your health care professional if you want to get pregnant within the next year. The effect of this medicine may last a long time after you get your last injection. What side effects may I notice from receiving this medicine? Side effects that you should report to your doctor or health care professional as soon as possible: -allergic reactions like skin rash, itching or hives, swelling of the face, lips, or tongue -breast tenderness or discharge -breathing problems -changes in vision -depression -feeling faint or lightheaded, falls -fever -pain in the abdomen, chest, groin, or leg -problems with balance, talking, walking -unusually weak or tired -yellowing of the eyes or skin Side effects that usually do not require medical attention (report to your doctor or health care professional if they continue or are bothersome): -acne -fluid retention and  swelling -headache -irregular periods, spotting, or absent periods -temporary pain, itching, or skin reaction at site where injected -weight gain This list may not describe all possible side effects. Call your doctor for medical advice about side effects. You may report side effects to FDA at 1-800-FDA-1088. Where should I keep my medicine? This does not apply. The injection will be given to you by a health care professional. NOTE: This sheet is a summary. It may not cover all possible information. If you have questions about this medicine, talk to your doctor, pharmacist, or health care provider.  2014, Elsevier/Gold Standard. (2008-10-31 18:37:56)

## 2014-04-07 NOTE — Progress Notes (Signed)
Patient ID: Alicia [name], female   DOB: May 16, 1991, 23 y.o.   MRN: [id number] Subjective:    Alicia [name] is a 23 y.o. [id number] African American female who presents for a postpartum visit. She is 4 weeks postpartum following a spontaneous vaginal delivery at 38.1 gestational weeks after IOL for cholestasis of pregnancy. Anesthesia: epidural. I have fully reviewed the prenatal and intrapartum course. Postpartum course has been uncomplicated. Baby's course has been uncomplicated. Baby is feeding by bottle. Bleeding no bleeding. Bowel function is constipation. Bladder function is normal. Patient is not sexually active. Last sexual activity: January. Contraception method is none and desires depo. Postpartum depression screening: negative. Score 3.  Last pap 2014 and was ASCUS.  The following portions of the patient's history were reviewed and updated as appropriate: allergies, current medications, past medical history, past surgical history and problem list.  Review of Systems Pertinent items are noted in HPI.   Filed Vitals:   04/07/14 1126  BP: 118/58  Height: 5\' 2"  (1.575 m)  Weight: 119 lb (53.978 kg)    Objective:   General:  alert, cooperative and no distress   Breasts:  deferred, no complaints  Lungs: clear to auscultation bilaterally  Heart:  regular rate and rhythm  Abdomen: soft, nontender   Vulva: normal  Vagina: normal vagina  Cervix:  closed  Corpus: Well-involuted  Adnexa:  Non-palpable  Rectal Exam: No hemorrhoids        Assessment:   Postpartum exam 4 wks s/p SVD Bottlefeeding Depression screening Contraception counseling  Constipation Plan:  Reviewed constipation relief measures Contraception: rx depo w/ 3RF Follow up in: this afternoon for depo shot, then 4wks for pap & physical  Tawnya Crook CNM, [facility] 04/07/2014 11:52 AM

## 2014-04-08 ENCOUNTER — Encounter: Payer: Self-pay | Admitting: Adult Health

## 2014-04-08 ENCOUNTER — Ambulatory Visit (INDEPENDENT_AMBULATORY_CARE_PROVIDER_SITE_OTHER): Payer: Managed Care, Other (non HMO) | Admitting: Adult Health

## 2014-04-08 DIAGNOSIS — Z3202 Encounter for pregnancy test, result negative: Secondary | ICD-10-CM

## 2014-04-08 DIAGNOSIS — Z3049 Encounter for surveillance of other contraceptives: Secondary | ICD-10-CM

## 2014-04-08 DIAGNOSIS — Z309 Encounter for contraceptive management, unspecified: Secondary | ICD-10-CM

## 2014-04-08 LAB — POCT URINE PREGNANCY: Preg Test, Ur: NEGATIVE

## 2014-04-08 MED ORDER — MEDROXYPROGESTERONE ACETATE 150 MG/ML IM SUSP
150.0000 mg | Freq: Once | INTRAMUSCULAR | Status: AC
  Administered 2014-04-08: 150 mg via INTRAMUSCULAR

## 2014-04-11 ENCOUNTER — Telehealth: Payer: Self-pay | Admitting: *Deleted

## 2014-04-11 NOTE — Telephone Encounter (Signed)
Pt requesting a note to return to work with no restriction from her maternity leave. Pt had her postpartum appt with Knute Neu, CNM on 04/07/14. Note completed. Pt to pick up on Monday.

## 2014-04-28 ENCOUNTER — Telehealth: Payer: Self-pay | Admitting: Obstetrics and Gynecology

## 2014-04-28 ENCOUNTER — Encounter: Payer: Self-pay | Admitting: Adult Health

## 2014-04-28 ENCOUNTER — Ambulatory Visit (INDEPENDENT_AMBULATORY_CARE_PROVIDER_SITE_OTHER): Payer: Managed Care, Other (non HMO) | Admitting: Adult Health

## 2014-04-28 VITALS — BP 118/68 | Ht 62.0 in | Wt 118.0 lb

## 2014-04-28 DIAGNOSIS — N920 Excessive and frequent menstruation with regular cycle: Secondary | ICD-10-CM

## 2014-04-28 DIAGNOSIS — N921 Excessive and frequent menstruation with irregular cycle: Secondary | ICD-10-CM

## 2014-04-28 HISTORY — DX: Excessive and frequent menstruation with regular cycle: N92.0

## 2014-04-28 MED ORDER — MEGESTROL ACETATE 40 MG PO TABS: ORAL_TABLET | ORAL | Status: DC

## 2014-04-28 NOTE — Patient Instructions (Signed)

## 2014-04-28 NOTE — Telephone Encounter (Signed)
Pt states that she got her DEPO shot, about a week and a half ago. Pt states that once she got her shot she started bleeding and has been bleeding since then. This was the pt's first shot. Pt states that she is having really heavy bleeding. Pt states that she delivered about 5.5 weeks ago. Pt states that she is changing an overnight pad every hour.  I spoke with JAG and she advised that if the pt was changing her pad every hour then she would need to be seen, pt was switched up front to make an appointment.

## 2014-04-28 NOTE — Progress Notes (Signed)
Subjective:     Patient ID: Alicia [name], female   DOB: Sep 18, 1991, 23 y.o.   MRN: [id number]  [place] is a 23 year old black female, who delivered about 7 weeks, and then got depo 6/15 and has been bleeding daily since, changes pads every 1-2 hours but they are not saturated.Last sexual encounter January.Denies any pain.  Review of Systems See HPI Reviewed past medical,surgical, social and family history. Reviewed medications and allergies.     Objective:   Physical Exam BP 118/68  Ht 5\' 2"  (1.575 m)  Wt 118 lb (53.524 kg)  BMI 21.58 kg/m2  LMP 04/18/2014  Breastfeeding? No Skin warm and dry.Pelvic: external genitalia is normal in appearance, vagina: dark period like blood, with small clots, without odor, cervix:smooth and bulbous, uterus: normal size, shape and contour, non tender, no masses felt, adnexa: no masses or tenderness noted. Discussed with Dr Elonda Husky, will try megace.    Assessment:     Menorrhagia     Plan:     Rx megace 40 mg #45 3 x 5 days then 2 x 5 days then 1 daily with 1 refill   Follow up in 4 weeks, call prn problems or questions Review handout on menorrhagia

## 2014-05-05 ENCOUNTER — Other Ambulatory Visit: Payer: Managed Care, Other (non HMO) | Admitting: Women's Health

## 2014-05-26 ENCOUNTER — Ambulatory Visit: Payer: Managed Care, Other (non HMO) | Admitting: Adult Health

## 2014-07-01 ENCOUNTER — Ambulatory Visit: Payer: Managed Care, Other (non HMO)

## 2014-07-02 ENCOUNTER — Ambulatory Visit: Payer: Managed Care, Other (non HMO)

## 2014-07-03 ENCOUNTER — Ambulatory Visit (INDEPENDENT_AMBULATORY_CARE_PROVIDER_SITE_OTHER): Payer: Managed Care, Other (non HMO) | Admitting: Adult Health

## 2014-07-03 ENCOUNTER — Encounter: Payer: Self-pay | Admitting: Adult Health

## 2014-07-03 DIAGNOSIS — Z3049 Encounter for surveillance of other contraceptives: Secondary | ICD-10-CM

## 2014-07-03 DIAGNOSIS — Z3202 Encounter for pregnancy test, result negative: Secondary | ICD-10-CM

## 2014-07-03 DIAGNOSIS — Z7689 Persons encountering health services in other specified circumstances: Secondary | ICD-10-CM

## 2014-07-03 LAB — POCT URINE PREGNANCY: Preg Test, Ur: NEGATIVE

## 2014-07-03 MED ORDER — MEDROXYPROGESTERONE ACETATE 150 MG/ML IM SUSP
150.0000 mg | Freq: Once | INTRAMUSCULAR | Status: AC
Start: 2014-07-03 — End: 2014-07-03
  Administered 2014-07-03: 150 mg via INTRAMUSCULAR

## 2014-07-19 ENCOUNTER — Encounter (HOSPITAL_COMMUNITY): Payer: Self-pay | Admitting: Emergency Medicine

## 2014-07-19 ENCOUNTER — Emergency Department (HOSPITAL_COMMUNITY): Payer: Managed Care, Other (non HMO)

## 2014-07-19 ENCOUNTER — Emergency Department (HOSPITAL_COMMUNITY)
Admission: EM | Admit: 2014-07-19 | Discharge: 2014-07-19 | Disposition: A | Discharge status: Home / Self Care | Payer: Managed Care, Other (non HMO) | Attending: Emergency Medicine | Admitting: Emergency Medicine

## 2014-07-19 DIAGNOSIS — S99919A Unspecified injury of unspecified ankle, initial encounter: Secondary | ICD-10-CM

## 2014-07-19 DIAGNOSIS — Y9389 Activity, other specified: Secondary | ICD-10-CM | POA: Diagnosis not present

## 2014-07-19 DIAGNOSIS — Z8619 Personal history of other infectious and parasitic diseases: Secondary | ICD-10-CM | POA: Insufficient documentation

## 2014-07-19 DIAGNOSIS — S8990XA Unspecified injury of unspecified lower leg, initial encounter: Secondary | ICD-10-CM | POA: Diagnosis present

## 2014-07-19 DIAGNOSIS — Z8742 Personal history of other diseases of the female genital tract: Secondary | ICD-10-CM | POA: Diagnosis not present

## 2014-07-19 DIAGNOSIS — Z87440 Personal history of urinary (tract) infections: Secondary | ICD-10-CM | POA: Insufficient documentation

## 2014-07-19 DIAGNOSIS — S99929A Unspecified injury of unspecified foot, initial encounter: Secondary | ICD-10-CM

## 2014-07-19 DIAGNOSIS — S86911A Strain of unspecified muscle(s) and tendon(s) at lower leg level, right leg, initial encounter: Secondary | ICD-10-CM

## 2014-07-19 DIAGNOSIS — F17200 Nicotine dependence, unspecified, uncomplicated: Secondary | ICD-10-CM | POA: Diagnosis not present

## 2014-07-19 DIAGNOSIS — Y929 Unspecified place or not applicable: Secondary | ICD-10-CM | POA: Diagnosis not present

## 2014-07-19 DIAGNOSIS — IMO0002 Reserved for concepts with insufficient information to code with codable children: Secondary | ICD-10-CM | POA: Diagnosis not present

## 2014-07-19 DIAGNOSIS — X500XXA Overexertion from strenuous movement or load, initial encounter: Secondary | ICD-10-CM | POA: Insufficient documentation

## 2014-07-19 DIAGNOSIS — Z880 Allergy status to penicillin: Secondary | ICD-10-CM | POA: Diagnosis not present

## 2014-07-19 MED ORDER — IBUPROFEN 600 MG PO TABS: 600.0000 mg | ORAL_TABLET | Freq: Four times a day (QID) | ORAL | Status: DC | PRN

## 2014-07-19 NOTE — ED Provider Notes (Signed)
Medical screening examination/treatment/procedure(s) were performed by non-physician practitioner and as supervising physician I was immediately available for consultation/collaboration.   EKG Interpretation None      Rolland Porter, MD, [facility]   Janice Norrie, MD 07/19/14 [phone]

## 2014-07-19 NOTE — ED Provider Notes (Signed)
CSN: [id number]     Arrival date & time 07/19/14  4098 History   First MD Initiated Contact with Patient 07/19/14 0930     Chief Complaint  Patient presents with  . Knee Pain     (Consider location/radiation/quality/duration/timing/severity/associated sxs/prior Treatment) The history is provided by the patient.   Alicia [name] is a 23 y.o. female with no significant past medical history presenting with sudden onset of sharp right knee pain when she bent down to pick up her child yesterday.  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  She has applied ice and rest which improves her symptoms. She denies weakness in the joint and denies prior injury.  There is radiation of sharp pain into the lateral upper thigh with prolonged weight bearing.      Past Medical History  Diagnosis Date  . Urinary tract infection   . HSV-2 infection   . Heavy menstrual period 04/28/2014   Past Surgical History  Procedure Laterality Date  . No past surgeries     Family History  Problem Relation Age of Onset  . Hypertension Mother   . Anemia Mother   . Cancer Maternal Grandmother     breast  . Heart attack Paternal Grandfather    History  Substance Use Topics  . Smoking status: Current Every Day Smoker -- 0.04 packs/day for .3 years    Types: Cigarettes  . Smokeless tobacco: Never Used  . Alcohol Use: No   OB History   Grav Para Term Preterm Abortions TAB SAB Ect Mult Living   1 1 1       1      Review of Systems  Constitutional: Negative for fever.  Musculoskeletal: Positive for arthralgias and joint swelling. Negative for myalgias.  Neurological: Negative for weakness and numbness.      Allergies  Penicillins and Sulfa antibiotics  Home Medications   Prior to Admission  medications   Medication Sig Start Date End Date Taking? Authorizing Provider  ibuprofen (ADVIL,MOTRIN) 600 MG tablet Take 1 tablet (600 mg total) by mouth every 6 (six) hours as needed. 07/19/14   Evalee Jefferson, PA-C  medroxyPROGESTERone (DEPO-PROVERA) 150 MG/ML injection Inject 1 mL (150 mg total) into the muscle every 3 (three) months. 04/07/14   Tawnya Crook, CNM   BP 111/74  Pulse 80  Temp(Src) 98.2 F (36.8 C) (Oral)  Resp 16  Ht 5\' 2"  (1.575 m)  Wt 119 lb (53.978 kg)  BMI 21.76 kg/m2  SpO2 100% Physical Exam  Constitutional: She appears well-developed and well-nourished.  HENT:  Head: Atraumatic.  Neck: Normal range of motion.  Cardiovascular:  Pulses equal bilaterally  Musculoskeletal: She exhibits tenderness.       Right knee: She exhibits normal range of motion, no swelling, no effusion, no deformity, no erythema, normal alignment, no LCL laxity, normal patellar mobility, no bony tenderness and no MCL laxity. Tenderness found.  TTP bilateral suprapatella of right knee,no appreciable effusion,  Mild swelling,  no tenderness or defect appreciated in quadriceps femoris tendon. SLR with no increased pain or collapse of knee.  Neurological: She is alert. She has normal strength. She displays normal reflexes. No sensory deficit.  Skin: Skin is warm and dry.  Psychiatric: She has a normal mood and affect.  ED Course  Procedures (including critical care time) Labs Review Labs Reviewed - No data to display  Imaging Review Dg Knee Complete 4 Views Right  07/19/2014   CLINICAL DATA:  Right knee pain, swelling  EXAM: RIGHT KNEE - COMPLETE 4+ VIEW  COMPARISON:  None.  FINDINGS: Normal alignment without fracture or significant effusion. Minor diffuse soft tissue swelling. Preserved joint spaces. No significant arthropathy.  IMPRESSION: Soft tissue swelling.  No acute osseous finding   Electronically Signed   By: Daryll Brod M.D.   On: 07/19/2014 10:57     EKG  Interpretation None      MDM   Final diagnoses:  Knee strain, right, initial encounter    Patients labs and/or radiological studies were viewed and considered during the medical decision making and disposition process. Jones dressing applied.  Encouraged ice,  Elevation, rest.  Ibuprofen. F/u with pcp for a recheck if not improving over the next week.    Evalee Jefferson, PA-C 07/19/14 1110

## 2014-07-19 NOTE — ED Notes (Signed)
Right knee pain starting yesterday after bending down with swelling.

## 2014-07-19 NOTE — Discharge Instructions (Signed)
Strain A strain is an injury to a muscle or the tissue that connects muscles to bones (tendon). In a strain injury, the muscle or tendon is either stretched or torn. Muscles are more susceptible to strains if they cross two joints, such as:  Hamstrings.  Quadriceps.  Calves.  Biceps. There are three categories of strains:  A first-degree strain is a small tear in the muscle. There is no lengthening of the muscle, but pain may be present with contraction of the muscle.  A second-degree strain is a small tear in the muscle accompanied by lengthening of the muscle. Muscles with a second-degree strain are still able to function.  A third-degree strain is a complete tear of the muscle. Muscles with a third-degree strain cannot function properly. Strains often have bleeding and bruising within the muscle. SYMPTOMS   Pain, tenderness, redness or bruising, and swelling in the area of injury.  Loss of normal mobility of the injured joint. CAUSES  A sudden force exerted on a muscle or tendon that it cannot withstand usually causes strains. This may be due to a sudden overload of a contracted muscle, overuse, or sudden increase or change in activity.  RISK INCREASES WITH:  Trauma.  Poor strength and flexibility.  Failure to warm-up properly before activity.  Return to activity before healing is complete. PREVENTION  Warm-up and stretch properly before and activity.  Maintain physical fitness:  Joint flexibility.  Muscle strength.  Endurance and conditioning.  Strengthen weak muscles with exercises to prevent recurrence. PROGNOSIS  If treated properly, strains are usually curable. The time it takes to recover is related to the severity of the injury and usually varies from 2 to 8 weeks. RELATED COMPLICATIONS   Re-injury or recurrence of symptoms, permanent weakness.  Joint stiffness if the strain is severe and rehabilitation is incomplete.  Delayed healing or resolution of  symptoms if sports are resumed before rehabilitation is complete.  Excessive bleeding into muscle, especially if taking anti-inflammatory medicines. This can lead to delayed recovery and injury to nerves, muscle, and blood vessels; this is an emergency. TREATMENT  Treatment initially involves ice and medicine to help reduce pain and inflammation. Use of the affected muscle should be limited by a:  Brace.  Elastic bandage wrapping.  Splint.  Cast.  Sling. Strengthening and stretching exercises may be necessary after immobilization to prevent joint stiffness. These exercises may be completed at home or with a therapist. If the tendon is torn, then surgery may be necessary to repair it.  MEDICATION   Nonsteroidal anti-inflammatory medicines, such as aspirin and ibuprofen, or other minor pain relievers, such as acetaminophen, are often recommended.  Do not take pain medicine within 7 days before surgery.  Prescription pain relievers may be prescribed. Use only as directed and only as much as you need  Ointments applied to the skin may be helpful. HEAT AND COLD  Cold treatment (icing) relieves pain and reduces inflammation. Cold treatment should be applied for 10 to 15 minutes every 2 to 3 hours for inflammation and pain and immediately after any activity that aggravates your symptoms. Use ice packs or massage the area with a piece of ice (ice massage).  Apply this treatment for the next 2 days.  Heat treatment may be used prior to performing the stretching and strengthening activities prescribed by your caregiver, physical therapist, or athletic trainer. Use a heat pack or soak your injury in warm water.  Heat can be applied starting on Monday. [place]  CARE IF:   Symptoms get worse or do not improve despite treatment.  Pain becomes intolerable.  You experience numbness or tingling.  New, unexplained symptoms develop (drugs used in treatment may produce side effects). Document  Released: 10/10/2005 Document Revised: 01/02/2012 Document Reviewed: 01/22/2009 [facility] Patient Information 2015 [place], [place]. This information is not intended to replace advice given to you by your health care provider. Make sure you discuss any questions you have with your health care provider.

## 2014-08-25 ENCOUNTER — Encounter (HOSPITAL_COMMUNITY): Payer: Self-pay | Admitting: Emergency Medicine

## 2014-09-25 ENCOUNTER — Ambulatory Visit: Payer: Managed Care, Other (non HMO)

## 2014-09-25 ENCOUNTER — Encounter: Payer: Self-pay | Admitting: Adult Health

## 2014-09-25 ENCOUNTER — Ambulatory Visit (INDEPENDENT_AMBULATORY_CARE_PROVIDER_SITE_OTHER): Payer: Managed Care, Other (non HMO) | Admitting: Adult Health

## 2014-09-25 VITALS — BP 108/74 | Ht 62.0 in | Wt 119.0 lb

## 2014-09-25 DIAGNOSIS — N926 Irregular menstruation, unspecified: Secondary | ICD-10-CM

## 2014-09-25 DIAGNOSIS — Z30011 Encounter for initial prescription of contraceptive pills: Secondary | ICD-10-CM

## 2014-09-25 DIAGNOSIS — Z3202 Encounter for pregnancy test, result negative: Secondary | ICD-10-CM

## 2014-09-25 LAB — POCT URINE PREGNANCY: Preg Test, Ur: NEGATIVE

## 2014-09-25 MED ORDER — NORETHIN-ETH ESTRAD-FE BIPHAS 1 MG-10 MCG / 10 MCG PO TABS: 1.0000 | ORAL_TABLET | Freq: Every day | ORAL | Status: DC

## 2014-09-25 NOTE — Patient Instructions (Signed)
Start OCs today  Follow up in 4 weeks

## 2014-09-25 NOTE — Progress Notes (Signed)
Subjective:     Patient ID: Alicia [name], female   DOB: June 03, 1991, 23 y.o.   MRN: [id number]  HPI Alicia [name] is a 23 year old black female in to change birth control, her depo is due today and she does not want it any more has had bleeding since June on and off.Las sex about 10 months ago.No pain or burning.  Review of Systems See HPI Reviewed past medical,surgical, social and family history. Reviewed medications and allergies.     Objective:   Physical Exam BP 108/74 mmHg  Ht 5\' 2"  (1.575 m)  Wt 119 lb (53.978 kg)  BMI 21.76 kg/m2  Breastfeeding? NoUPT negative, talk only has had period on and off since June with depo, wants to try OCs, has  used in past.Not having sex or planning on sex.    Assessment:    Contraceptive management Irregular bleeding with depo    Plan:     Begin lo loestrin today 1 pack given lot [id number] A, exp 5.16 Rx lo loestrin take 1 daily disp 1 pack with 11 refills Follow up in 4 weeks

## 2014-10-23 ENCOUNTER — Ambulatory Visit: Payer: Managed Care, Other (non HMO) | Admitting: Adult Health

## 2014-10-23 ENCOUNTER — Encounter: Payer: Self-pay | Admitting: *Deleted

## 2014-12-01 ENCOUNTER — Ambulatory Visit (INDEPENDENT_AMBULATORY_CARE_PROVIDER_SITE_OTHER): Payer: Managed Care, Other (non HMO) | Admitting: Women's Health

## 2014-12-01 ENCOUNTER — Encounter: Payer: Self-pay | Admitting: Women's Health

## 2014-12-01 VITALS — BP 120/56 | Ht 62.0 in | Wt 119.0 lb

## 2014-12-01 DIAGNOSIS — N898 Other specified noninflammatory disorders of vagina: Secondary | ICD-10-CM

## 2014-12-01 DIAGNOSIS — Z8619 Personal history of other infectious and parasitic diseases: Secondary | ICD-10-CM

## 2014-12-01 DIAGNOSIS — A499 Bacterial infection, unspecified: Secondary | ICD-10-CM

## 2014-12-01 DIAGNOSIS — B9689 Other specified bacterial agents as the cause of diseases classified elsewhere: Secondary | ICD-10-CM | POA: Insufficient documentation

## 2014-12-01 DIAGNOSIS — N760 Acute vaginitis: Secondary | ICD-10-CM

## 2014-12-01 LAB — POCT WET PREP (WET MOUNT): Clue Cells Wet Prep Whiff POC: POSITIVE

## 2014-12-01 MED ORDER — METRONIDAZOLE 500 MG PO TABS: 500.0000 mg | ORAL_TABLET | Freq: Two times a day (BID) | ORAL | Status: DC

## 2014-12-01 NOTE — Progress Notes (Signed)
Patient ID: Alicia [name], female   DOB: [birth year], 24 y.o.   MRN: [id number]   [facility] Visit  Patient name: Alicia [name] MRN [id number]  Date of birth: [birth year]  CC & HPI:  Alicia [name] is a 24 y.o. African American female presenting today for report of yellow-brownish foul smelling d/c x 2wks. No itching/irritation.  Hasn't been sexually active for about 1.2yrs. Has h/o CT. Last pap 2014 [facility].   Pertinent History Reviewed:  Medical & Surgical Hx:   Past Medical History  Diagnosis Date  . Urinary tract infection   . HSV-2 infection   . Heavy menstrual period 04/28/2014   Past Surgical History  Procedure Laterality Date  . No past surgeries     Medications: Reviewed & Updated - see associated section Social History: Reviewed -  reports that she has been smoking Cigarettes.  She has a .012 pack-year smoking history. She has never used smokeless tobacco.  Objective Findings:  Vitals: BP 120/56 mmHg  Ht 5\' 2"  (1.575 m)  Wt 119 lb (53.978 kg)  BMI 21.76 kg/m2  LMP  (LMP Unknown)  Physical Examination: General appearance - alert, well appearing, and in no distress Pelvic - moderate amount thin frothy yellow malodorous d/c  Results for orders placed or performed in visit on 12/01/14 (from the past 24 hour(s))  POCT Wet Prep [facility] [place])   Collection Time: 12/01/14  2:17 PM  Result Value Ref Range   Source Wet Prep POC vaginal    WBC, Wet Prep HPF POC few    Bacteria Wet Prep HPF POC none    BACTERIA WET PREP MORPHOLOGY POC     Clue Cells Wet Prep HPF POC Many    Clue Cells Wet Prep Whiff POC Positive Whiff    Yeast Wet Prep HPF POC None    KOH Wet Prep POC     Trichomonas Wet Prep HPF POC none      Assessment & Plan:  A:   BV  Abnormal pap 2014 P:  Rx flagyl bid x 7d for BV, no etoh   F/U 2wks for pap & physical   Tawnya Crook CNM, [facility] 12/01/2014 2:17 PM

## 2014-12-01 NOTE — Patient Instructions (Signed)
Bacterial Vaginosis Bacterial vaginosis is a vaginal infection that occurs when the normal balance of bacteria in the vagina is disrupted. It results from an overgrowth of certain bacteria. This is the most common vaginal infection in women of childbearing age. Treatment is important to prevent complications, especially in pregnant women, as it can cause a premature delivery. CAUSES  Bacterial vaginosis is caused by an increase in harmful bacteria that are normally present in smaller amounts in the vagina. Several different kinds of bacteria can cause bacterial vaginosis. However, the reason that the condition develops is not fully understood. RISK FACTORS Certain activities or behaviors can put you at an increased risk of developing bacterial vaginosis, including:  Having a new sex partner or multiple sex partners.  Douching.  Using an intrauterine device (IUD) for contraception. Women do not get bacterial vaginosis from toilet seats, bedding, swimming pools, or contact with objects around them. SIGNS AND SYMPTOMS  Some women with bacterial vaginosis have no signs or symptoms. Common symptoms include:  Grey vaginal discharge.  A fishlike odor with discharge, especially after sexual intercourse.  Itching or burning of the vagina and vulva.  Burning or pain with urination. DIAGNOSIS  Your health care provider will take a medical history and examine the vagina for signs of bacterial vaginosis. A sample of vaginal fluid may be taken. Your health care provider will look at this sample under a microscope to check for bacteria and abnormal cells. A vaginal pH test may also be done.  TREATMENT  Bacterial vaginosis may be treated with antibiotic medicines. These may be given in the form of a pill or a vaginal cream. A second round of antibiotics may be prescribed if the condition comes back after treatment.  HOME CARE INSTRUCTIONS   Only take over-the-counter or prescription medicines as  directed by your health care provider.  If antibiotic medicine was prescribed, take it as directed. Make sure you finish it even if you start to feel better.  Do not have sex until treatment is completed.  Tell all sexual partners that you have a vaginal infection. They should see their health care provider and be treated if they have problems, such as a mild rash or itching.  Practice safe sex by using condoms and only having one sex partner. SEEK MEDICAL CARE IF:   Your symptoms are not improving after 3 days of treatment.  You have increased discharge or pain.  You have a fever. MAKE SURE YOU:   Understand these instructions.  Will watch your condition.  Will get help right away if you are not doing well or get worse. FOR MORE INFORMATION  Centers for Disease Control and Prevention, Division of STD Prevention: www.cdc.gov/std American Sexual Health Association (ASHA): www.ashastd.org  Document Released: 10/10/2005 Document Revised: 07/31/2013 Document Reviewed: 05/22/2013 [employer] Patient Information 2015 [employer]. This information is not intended to replace advice given to you by your health care provider. Make sure you discuss any questions you have with your health care provider.  

## 2014-12-03 LAB — GC/CHLAMYDIA PROBE AMP
CHLAMYDIA, DNA PROBE: NEGATIVE
Neisseria gonorrhoeae by PCR: NEGATIVE

## 2014-12-16 ENCOUNTER — Other Ambulatory Visit: Payer: Managed Care, Other (non HMO) | Admitting: Women's Health

## 2014-12-24 ENCOUNTER — Other Ambulatory Visit: Payer: Managed Care, Other (non HMO) | Admitting: Adult Health

## 2014-12-31 ENCOUNTER — Ambulatory Visit (INDEPENDENT_AMBULATORY_CARE_PROVIDER_SITE_OTHER): Payer: Managed Care, Other (non HMO) | Admitting: Adult Health

## 2014-12-31 ENCOUNTER — Encounter: Payer: Self-pay | Admitting: Adult Health

## 2014-12-31 VITALS — BP 100/68 | HR 87 | Ht 62.0 in | Wt 118.0 lb

## 2014-12-31 DIAGNOSIS — L298 Other pruritus: Secondary | ICD-10-CM | POA: Diagnosis not present

## 2014-12-31 DIAGNOSIS — B379 Candidiasis, unspecified: Secondary | ICD-10-CM | POA: Insufficient documentation

## 2014-12-31 DIAGNOSIS — N898 Other specified noninflammatory disorders of vagina: Secondary | ICD-10-CM

## 2014-12-31 HISTORY — DX: Other specified noninflammatory disorders of vagina: N89.8

## 2014-12-31 HISTORY — DX: Candidiasis, unspecified: B37.9

## 2014-12-31 LAB — POCT WET PREP (WET MOUNT): WBC WET PREP: POSITIVE

## 2014-12-31 MED ORDER — FLUCONAZOLE 150 MG PO TABS: ORAL_TABLET | ORAL | Status: DC

## 2014-12-31 NOTE — Patient Instructions (Signed)
Monilial Vaginitis Vaginitis in a soreness, swelling and redness (inflammation) of the vagina and vulva. Monilial vaginitis is not a sexually transmitted infection. CAUSES  Yeast vaginitis is caused by yeast (candida) that is normally found in your vagina. With a yeast infection, the candida has overgrown in number to a point that upsets the chemical balance. SYMPTOMS   White, thick vaginal discharge.  Swelling, itching, redness and irritation of the vagina and possibly the lips of the vagina (vulva).  Burning or painful urination.  Painful intercourse. DIAGNOSIS  Things that may contribute to monilial vaginitis are:  Postmenopausal and virginal states.  Pregnancy.  Infections.  Being tired, sick or stressed, especially if you had monilial vaginitis in the past.  Diabetes. Good control will help lower the chance.  Birth control pills.  Tight fitting garments.  Using bubble bath, feminine sprays, douches or deodorant tampons.  Taking certain medications that kill germs (antibiotics).  Sporadic recurrence can occur if you become ill. TREATMENT  Your caregiver will give you medication.  There are several kinds of anti monilial vaginal creams and suppositories specific for monilial vaginitis. For recurrent yeast infections, use a suppository or cream in the vagina 2 times a week, or as directed.  Anti-monilial or steroid cream for the itching or irritation of the vulva may also be used. Get your caregiver's permission.  Painting the vagina with methylene blue solution may help if the monilial cream does not work.  Eating yogurt may help prevent monilial vaginitis. HOME CARE INSTRUCTIONS   Finish all medication as prescribed.  Do not have sex until treatment is completed or after your caregiver tells you it is okay.  Take warm sitz baths.  Do not douche.  Do not use tampons, especially scented ones.  Wear cotton underwear.  Avoid tight pants and panty  hose.  Tell your sexual partner that you have a yeast infection. They should go to their caregiver if they have symptoms such as mild rash or itching.  Your sexual partner should be treated as well if your infection is difficult to eliminate.  Practice safer sex. Use condoms.  Some vaginal medications cause latex condoms to fail. Vaginal medications that harm condoms are:  Cleocin cream.  Butoconazole (Femstat).  Terconazole (Terazol) vaginal suppository.  Miconazole (Monistat) (may be purchased over the counter). SEEK MEDICAL CARE IF:   You have a temperature by mouth above 102 F (38.9 C).  The infection is getting worse after 2 days of treatment.  The infection is not getting better after 3 days of treatment.  You develop blisters in or around your vagina.  You develop vaginal bleeding, and it is not your menstrual period.  You have pain when you urinate.  You develop intestinal problems.  You have pain with sexual intercourse. Document Released: 07/20/2005 Document Revised: 01/02/2012 Document Reviewed: 04/03/2009 [facility] Patient Information 2015 [place], [place]. This information is not intended to replace advice given to you by your health care provider. Make sure you discuss any questions you have with your health care provider. Take diflucan  follo wup prn No thongs Or tub baths

## 2014-12-31 NOTE — Progress Notes (Signed)
Subjective:     Patient ID: Alicia [name], female   DOB: Jan 31, 1991, 24 y.o.   MRN: [id number]  HPI Alicia [name] is a 24 year old black female in complaining of vaginal itch.No discharge noted or odor, has used condoms, no new partner.  Review of Systems + vaginal itch, all other systems negative Reviewed past medical,surgical, social and family history. Reviewed medications and allergies.     Objective:   Physical Exam BP 100/68 mmHg  Pulse 87  Ht 5\' 2"  (1.575 m)  Wt 118 lb (53.524 kg)  BMI 21.58 kg/m2  LMP 12/18/2014  Breastfeeding? No  Skin warm and dry.Pelvic: external genitalia is normal in appearance no lesions, vagina: white, clumpy discharge without odor, red sidewalls,urethra has no lesions or masses noted, cervix:smooth and bulbous, uterus: normal size, shape and contour, non tender, no masses felt, adnexa: no masses or tenderness noted. Bladder is non tender and no masses felt. Wet prep: + for yeast and +WBCs.    Assessment:     Vaginal itch Vaginal discharge Yeast     Plan:     Rx diflucan 150 mg #2 take 1 now and 1 in 3 days with 1 refill No thongs No tub baths Follow up prn Review handout on yeast infection

## 2015-01-01 ENCOUNTER — Other Ambulatory Visit: Payer: Managed Care, Other (non HMO) | Admitting: Adult Health

## 2015-01-01 ENCOUNTER — Telehealth: Payer: Self-pay | Admitting: Adult Health

## 2015-01-01 NOTE — Telephone Encounter (Signed)
Pt states she saw Derrek Monaco, NP yesterday for yeast infection, not any better today. Pt informed she took the first tablet yesterday, needs to give it time and can take the 2 tablet on day 3 if no improvement. If no improvement after second dose of Diflucan pt to call our office back. Pt verbalized understanding.

## 2015-01-07 ENCOUNTER — Other Ambulatory Visit: Payer: Managed Care, Other (non HMO) | Admitting: Adult Health

## 2015-01-07 ENCOUNTER — Telehealth: Payer: Self-pay | Admitting: Adult Health

## 2015-01-07 NOTE — Telephone Encounter (Signed)
Has already taken diflucan x 2 tabs, has period now, told her that should help, make appt if does not

## 2015-01-07 NOTE — Telephone Encounter (Signed)
Spoke with pt. Pt had appt today but cancelled due to period being heavy. Pt is still having symptoms of a yeast infection. She has taken both Diflucan. Please advise!! Thanks!! Lena

## 2015-01-08 ENCOUNTER — Emergency Department (HOSPITAL_COMMUNITY): Payer: Managed Care, Other (non HMO)

## 2015-01-08 ENCOUNTER — Encounter (HOSPITAL_COMMUNITY): Payer: Self-pay | Admitting: *Deleted

## 2015-01-08 ENCOUNTER — Emergency Department (HOSPITAL_COMMUNITY)
Admission: EM | Admit: 2015-01-08 | Discharge: 2015-01-08 | Disposition: A | Discharge status: Home / Self Care | Payer: Managed Care, Other (non HMO) | Attending: Emergency Medicine | Admitting: Emergency Medicine

## 2015-01-08 DIAGNOSIS — Y9301 Activity, walking, marching and hiking: Secondary | ICD-10-CM | POA: Insufficient documentation

## 2015-01-08 DIAGNOSIS — Z872 Personal history of diseases of the skin and subcutaneous tissue: Secondary | ICD-10-CM | POA: Diagnosis not present

## 2015-01-08 DIAGNOSIS — Z8742 Personal history of other diseases of the female genital tract: Secondary | ICD-10-CM | POA: Insufficient documentation

## 2015-01-08 DIAGNOSIS — S99911A Unspecified injury of right ankle, initial encounter: Secondary | ICD-10-CM | POA: Diagnosis present

## 2015-01-08 DIAGNOSIS — Z8619 Personal history of other infectious and parasitic diseases: Secondary | ICD-10-CM | POA: Diagnosis not present

## 2015-01-08 DIAGNOSIS — Z79899 Other long term (current) drug therapy: Secondary | ICD-10-CM | POA: Insufficient documentation

## 2015-01-08 DIAGNOSIS — Z880 Allergy status to penicillin: Secondary | ICD-10-CM | POA: Insufficient documentation

## 2015-01-08 DIAGNOSIS — Z720 Tobacco use: Secondary | ICD-10-CM | POA: Diagnosis not present

## 2015-01-08 DIAGNOSIS — S93401A Sprain of unspecified ligament of right ankle, initial encounter: Secondary | ICD-10-CM | POA: Insufficient documentation

## 2015-01-08 DIAGNOSIS — Y92009 Unspecified place in unspecified non-institutional (private) residence as the place of occurrence of the external cause: Secondary | ICD-10-CM | POA: Insufficient documentation

## 2015-01-08 DIAGNOSIS — Y998 Other external cause status: Secondary | ICD-10-CM | POA: Insufficient documentation

## 2015-01-08 DIAGNOSIS — Z87440 Personal history of urinary (tract) infections: Secondary | ICD-10-CM | POA: Insufficient documentation

## 2015-01-08 DIAGNOSIS — X58XXXA Exposure to other specified factors, initial encounter: Secondary | ICD-10-CM | POA: Insufficient documentation

## 2015-01-08 MED ORDER — HYDROCODONE-ACETAMINOPHEN 5-325 MG PO TABS: 1.0000 | ORAL_TABLET | ORAL | Status: DC | PRN

## 2015-01-08 MED ORDER — NAPROXEN 500 MG PO TABS: 500.0000 mg | ORAL_TABLET | Freq: Two times a day (BID) | ORAL | Status: DC

## 2015-01-08 NOTE — ED Notes (Signed)
Pt states she injured her right ankle while at home this morning. Pt states pain began 2 hours later.

## 2015-01-08 NOTE — ED Notes (Signed)
Pain rt ankle , says she  Twisted her ankle this am. Having increasing pain. Ice pack applied.

## 2015-01-08 NOTE — ED Provider Notes (Signed)
CSN: [id number]     Arrival date & time 01/08/15  1414 History   None    Chief Complaint  Patient presents with  . Ankle Pain     (Consider location/radiation/quality/duration/timing/severity/associated sxs/prior Treatment) Patient is a 24 y.o. female presenting with ankle pain. The history is provided by the patient.  Ankle Pain Location:  Ankle Injury: yes   Ankle location:  R ankle  YVETTA DROTAR is a 24 y.o. female who presents to the ED with right ankle pain. She twisted her ankle while at home this morning and the pain started about 2 hours later.  She was just walking and the ankle turned inward. She did not fall and denies any other injuries. She thought it would be fine but has gotten worse with more swelling to the lateral aspect.  Past Medical History  Diagnosis Date  . Urinary tract infection   . HSV-2 infection   . Heavy menstrual period 04/28/2014  . Vaginal itching 12/31/2014  . Vaginal discharge 12/31/2014  . Yeast infection 12/31/2014   Past Surgical History  Procedure Laterality Date  . No past surgeries     Family History  Problem Relation Age of Onset  . Hypertension Mother   . Anemia Mother   . Cancer Maternal Grandmother     breast  . Heart attack Paternal Grandfather    History  Substance Use Topics  . Smoking status: Current Every Day Smoker -- 0.04 packs/day for 2 years    Types: Cigarettes  . Smokeless tobacco: Never Used  . Alcohol Use: No   OB History    Gravida Para Term Preterm AB TAB SAB Ectopic Multiple Living   1 1 1       1      Review of Systems  Musculoskeletal:       Right ankle pain and swelling  All other systems negative.   Allergies  Penicillins and Sulfa antibiotics  Home Medications   Prior to Admission medications   Medication Sig Start Date End Date Taking? Authorizing Provider  Norethindrone-Ethinyl Estradiol-Fe Biphas (LO LOESTRIN FE) 1 MG-10 MCG / 10 MCG tablet Take 1 tablet by mouth daily. 09/25/14  Yes Estill Dooms, NP  fluconazole (DIFLUCAN) 150 MG tablet Take 1 now and 1 in 3 days Patient not taking: Reported on 01/08/2015 12/31/14   Estill Dooms, NP  HYDROcodone-acetaminophen (NORCO/VICODIN) 5-325 MG per tablet Take 1 tablet by mouth every 4 (four) hours as needed. 01/08/15   Nekisha Mcdiarmid Bunnie Pion, NP  naproxen (NAPROSYN) 500 MG tablet Take 1 tablet (500 mg total) by mouth 2 (two) times daily. 01/08/15   Kaitlin Ardito Bunnie Pion, NP   BP 114/77 mmHg  Pulse 94  Temp(Src) 98.6 F (37 C) (Oral)  Resp 14  Ht 5\' 2"  (1.575 m)  Wt 118 lb (53.524 kg)  BMI 21.58 kg/m2  SpO2 100%  LMP 12/18/2014 Physical Exam  Constitutional: She is oriented to person, place, and time. She appears well-developed and well-nourished.  HENT:  Head: Normocephalic.  Eyes: EOM are normal.  Neck: Neck supple.  Cardiovascular: Normal rate.   Pulmonary/Chest: Effort normal.  Musculoskeletal:       Right ankle: She exhibits decreased range of motion (due to pain) and swelling. She exhibits no deformity, no laceration and normal pulse. Tenderness. Lateral malleolus tenderness found. Achilles tendon normal.       Feet:  Tenderness and swelling to the lateral aspect of the right ankle. Pedal pulses equal, adequate circulation, good  touch sensation.   Neurological: She is alert and oriented to person, place, and time. No cranial nerve deficit.  Skin: Skin is warm and dry.  Psychiatric: She has a normal mood and affect. Her behavior is normal.  Nursing note and vitals reviewed.   ED Course  Procedures (including critical care time) Labs Review Labs Reviewed - No data to display  Imaging Review Dg Ankle Complete Right  01/08/2015   CLINICAL DATA:  Twisted her ankle with lateral pain, today  EXAM: RIGHT ANKLE - COMPLETE 3+ VIEW  COMPARISON:  None.  FINDINGS: Mortise intact. No fracture or dislocation. No significant joint effusion. Mild soft tissue swelling.  IMPRESSION: Mild sprain.  No fracture.   Electronically Signed   By: Skipper Cliche M.D.   On: 01/08/2015 15:10   X-ray, ice, elevate ASO, crutches, follow up with ortho.  MDM  24 y.o. female with right ankle pain s/p injury prior to arrival to the ED. Stable for d/c without neurovascular deficits. Discussed with the patient clinical and x-ray findings and plan of care. All questioned fully answered. She will follow up with ortho or return here if any problems arise.   Final diagnoses:  Ankle sprain, right, initial encounter      [facility], NP 01/10/15 0223  Milton Ferguson, MD 01/12/15 [phone]

## 2015-01-08 NOTE — Discharge Instructions (Signed)
Do not drive while taking the narcotic as it will make you sleepy. °

## 2015-01-20 ENCOUNTER — Other Ambulatory Visit: Payer: Managed Care, Other (non HMO) | Admitting: Adult Health

## 2015-01-22 ENCOUNTER — Encounter: Payer: Self-pay | Admitting: Adult Health

## 2015-01-22 ENCOUNTER — Other Ambulatory Visit (HOSPITAL_COMMUNITY)
Admission: RE | Admit: 2015-01-22 | Discharge: 2015-01-22 | Disposition: A | Discharge status: Home / Self Care | Payer: Managed Care, Other (non HMO) | Source: Ambulatory Visit | Attending: Adult Health | Admitting: Adult Health

## 2015-01-22 ENCOUNTER — Ambulatory Visit (INDEPENDENT_AMBULATORY_CARE_PROVIDER_SITE_OTHER): Payer: Managed Care, Other (non HMO) | Admitting: Adult Health

## 2015-01-22 VITALS — BP 100/60 | HR 116 | Ht 62.25 in | Wt 112.5 lb

## 2015-01-22 DIAGNOSIS — N898 Other specified noninflammatory disorders of vagina: Secondary | ICD-10-CM

## 2015-01-22 DIAGNOSIS — N760 Acute vaginitis: Secondary | ICD-10-CM

## 2015-01-22 DIAGNOSIS — Z113 Encounter for screening for infections with a predominantly sexual mode of transmission: Secondary | ICD-10-CM | POA: Diagnosis present

## 2015-01-22 DIAGNOSIS — Z01419 Encounter for gynecological examination (general) (routine) without abnormal findings: Secondary | ICD-10-CM | POA: Diagnosis not present

## 2015-01-22 DIAGNOSIS — Z3041 Encounter for surveillance of contraceptive pills: Secondary | ICD-10-CM

## 2015-01-22 DIAGNOSIS — B9689 Other specified bacterial agents as the cause of diseases classified elsewhere: Secondary | ICD-10-CM

## 2015-01-22 DIAGNOSIS — Z309 Encounter for contraceptive management, unspecified: Secondary | ICD-10-CM

## 2015-01-22 HISTORY — DX: Encounter for contraceptive management, unspecified: Z30.9

## 2015-01-22 LAB — POCT WET PREP (WET MOUNT): WBC, Wet Prep HPF POC: POSITIVE

## 2015-01-22 MED ORDER — METRONIDAZOLE 500 MG PO TABS: 500.0000 mg | ORAL_TABLET | Freq: Two times a day (BID) | ORAL | Status: DC

## 2015-01-22 MED ORDER — NORETHIN-ETH ESTRAD-FE BIPHAS 1 MG-10 MCG / 10 MCG PO TABS: 1.0000 | ORAL_TABLET | Freq: Every day | ORAL | Status: DC

## 2015-01-22 NOTE — Progress Notes (Signed)
Patient ID: Alicia [name], female   DOB: 1991/08/26, 24 y.o.   MRN: [id number] History of Present Illness: Alicia [name] is a 24 year old black female in for well woman gyn exam and pap and refill lo loestrin.She complains of vaginal itch with urination and some urinary frequency.   Current Medications, Allergies, Past Medical History, Past Surgical History, Family History and Social History were reviewed in [employer] record.     Review of Systems: Patient denies any headaches, hearing loss, fatigue, blurred vision, shortness of breath, chest pain, abdominal pain, problems with bowel movements, urination, or intercourse. No joint pain or mood swings, see HPI for positives.    Physical Exam:BP 100/60 mmHg  Pulse 116  Ht 5' 2.25" (1.581 m)  Wt 112 lb 8 oz (51.03 kg)  BMI 20.42 kg/m2  LMP 01/11/2015  Breastfeeding? No General:  Well developed, well nourished, no acute distress Skin:  Warm and dry Neck:  Midline trachea, normal thyroid, good ROM, no lymphadenopathy Lungs; Clear to auscultation bilaterally Breast:  No dominant palpable mass, retraction, or nipple discharge Cardiovascular: Regular rate and rhythm Abdomen:  Soft, non tender, no hepatosplenomegaly Pelvic:  External genitalia is normal in appearance, no lesions.  The vagina has white creamy discharge with odor. Urethra has no lesions or masses. The cervix is bulbous, pap with GC/CHL performed. Wet prep:+WBC and Clue cells.   Uterus is felt to be normal size, shape, and contour.  No adnexal masses or tenderness noted.Bladder is non tender, no masses felt. Extremities/musculoskeletal:  No swelling or varicosities noted, no clubbing or cyanosis Psych:  No mood changes, alert and cooperative,seems happy   Impression: Well woman gyn exam with pap Contraceptive management Vaginal discharge BV    Plan: Rx flagyl 500 mg 1 bid x 7 days, no alcohol, review handout on BV  Refilled lo loestrin x 1  year Check HIV,RPR, and HSV2  Physical in 1 year  Use condoms

## 2015-01-22 NOTE — Patient Instructions (Signed)
Bacterial Vaginosis Bacterial vaginosis is a vaginal infection that occurs when the normal balance of bacteria in the vagina is disrupted. It results from an overgrowth of certain bacteria. This is the most common vaginal infection in women of childbearing age. Treatment is important to prevent complications, especially in pregnant women, as it can cause a premature delivery. CAUSES  Bacterial vaginosis is caused by an increase in harmful bacteria that are normally present in smaller amounts in the vagina. Several different kinds of bacteria can cause bacterial vaginosis. However, the reason that the condition develops is not fully understood. RISK FACTORS Certain activities or behaviors can put you at an increased risk of developing bacterial vaginosis, including:  Having a new sex partner or multiple sex partners.  Douching.  Using an intrauterine device (IUD) for contraception. Women do not get bacterial vaginosis from toilet seats, bedding, swimming pools, or contact with objects around them. SIGNS AND SYMPTOMS  Some women with bacterial vaginosis have no signs or symptoms. Common symptoms include:  Grey vaginal discharge.  A fishlike odor with discharge, especially after sexual intercourse.  Itching or burning of the vagina and vulva.  Burning or pain with urination. DIAGNOSIS  Your health care provider will take a medical history and examine the vagina for signs of bacterial vaginosis. A sample of vaginal fluid may be taken. Your health care provider will look at this sample under a microscope to check for bacteria and abnormal cells. A vaginal pH test may also be done.  TREATMENT  Bacterial vaginosis may be treated with antibiotic medicines. These may be given in the form of a pill or a vaginal cream. A second round of antibiotics may be prescribed if the condition comes back after treatment.  HOME CARE INSTRUCTIONS   Only take over-the-counter or prescription medicines as  directed by your health care provider.  If antibiotic medicine was prescribed, take it as directed. Make sure you finish it even if you start to feel better.  Do not have sex until treatment is completed.  Tell all sexual partners that you have a vaginal infection. They should see their health care provider and be treated if they have problems, such as a mild rash or itching.  Practice safe sex by using condoms and only having one sex partner. SEEK MEDICAL CARE IF:   Your symptoms are not improving after 3 days of treatment.  You have increased discharge or pain.  You have a fever. MAKE SURE YOU:   Understand these instructions.  Will watch your condition.  Will get help right away if you are not doing well or get worse. FOR MORE INFORMATION  Centers for Disease Control and Prevention, Division of STD Prevention: [employer] American Sexual Health Association (ASHA): www.ashastd.org  Document Released: 10/10/2005 Document Revised: 07/31/2013 Document Reviewed: 05/22/2013 [facility] Patient Information 2015 [place], [place]. This information is not intended to replace advice given to you by your health care provider. Make sure you discuss any questions you have with your health care provider. Take flagyl No alcohol  Physical in 1 year

## 2015-01-23 ENCOUNTER — Telehealth: Payer: Self-pay | Admitting: Adult Health

## 2015-01-23 LAB — RPR: RPR: NONREACTIVE

## 2015-01-23 LAB — HIV ANTIBODY (ROUTINE TESTING W REFLEX): HIV SCREEN 4TH GENERATION: NONREACTIVE

## 2015-01-23 LAB — HSV 2 ANTIBODY, IGG: HSV 2 Glycoprotein G Ab, IgG: 2.83 index — ABNORMAL HIGH (ref 0.00–0.90)

## 2015-01-23 NOTE — Telephone Encounter (Signed)
Pt aware of labs, she had +HSV during pregnancy

## 2015-01-26 LAB — CYTOLOGY - PAP

## 2015-02-09 ENCOUNTER — Telehealth: Payer: Self-pay | Admitting: Adult Health

## 2015-02-09 MED ORDER — METRONIDAZOLE 500 MG PO TABS: 500.0000 mg | ORAL_TABLET | Freq: Two times a day (BID) | ORAL | Status: DC

## 2015-02-09 NOTE — Telephone Encounter (Signed)
Spoke with pt. Pt lost her Flagyl. She was supposed to be on med for 3 more days. Taking it BID. Can you order 6 more pills so she can finish treatment? Thanks!! [place]

## 2015-02-09 NOTE — Telephone Encounter (Signed)
Spoke with pt letting her know Flagyl was sent to pharmacy. [place]

## 2015-02-22 ENCOUNTER — Emergency Department (HOSPITAL_COMMUNITY): Payer: Managed Care, Other (non HMO)

## 2015-02-22 ENCOUNTER — Emergency Department (HOSPITAL_COMMUNITY)
Admission: EM | Admit: 2015-02-22 | Discharge: 2015-02-22 | Disposition: A | Discharge status: Home / Self Care | Payer: Managed Care, Other (non HMO) | Attending: Emergency Medicine | Admitting: Emergency Medicine

## 2015-02-22 ENCOUNTER — Encounter (HOSPITAL_COMMUNITY): Payer: Self-pay | Admitting: Emergency Medicine

## 2015-02-22 DIAGNOSIS — F17210 Nicotine dependence, cigarettes, uncomplicated: Secondary | ICD-10-CM | POA: Diagnosis not present

## 2015-02-22 DIAGNOSIS — O23591 Infection of other part of genital tract in pregnancy, first trimester: Secondary | ICD-10-CM | POA: Insufficient documentation

## 2015-02-22 DIAGNOSIS — Z880 Allergy status to penicillin: Secondary | ICD-10-CM | POA: Diagnosis not present

## 2015-02-22 DIAGNOSIS — Z87440 Personal history of urinary (tract) infections: Secondary | ICD-10-CM | POA: Diagnosis not present

## 2015-02-22 DIAGNOSIS — R102 Pelvic and perineal pain: Secondary | ICD-10-CM

## 2015-02-22 DIAGNOSIS — Z3491 Encounter for supervision of normal pregnancy, unspecified, first trimester: Secondary | ICD-10-CM

## 2015-02-22 DIAGNOSIS — Z3A01 Less than 8 weeks gestation of pregnancy: Secondary | ICD-10-CM | POA: Diagnosis not present

## 2015-02-22 DIAGNOSIS — Z79899 Other long term (current) drug therapy: Secondary | ICD-10-CM | POA: Diagnosis not present

## 2015-02-22 DIAGNOSIS — O9989 Other specified diseases and conditions complicating pregnancy, childbirth and the puerperium: Secondary | ICD-10-CM | POA: Diagnosis present

## 2015-02-22 DIAGNOSIS — N760 Acute vaginitis: Secondary | ICD-10-CM

## 2015-02-22 DIAGNOSIS — Z8742 Personal history of other diseases of the female genital tract: Secondary | ICD-10-CM | POA: Insufficient documentation

## 2015-02-22 DIAGNOSIS — Z792 Long term (current) use of antibiotics: Secondary | ICD-10-CM | POA: Insufficient documentation

## 2015-02-22 DIAGNOSIS — N898 Other specified noninflammatory disorders of vagina: Secondary | ICD-10-CM

## 2015-02-22 DIAGNOSIS — Z8619 Personal history of other infectious and parasitic diseases: Secondary | ICD-10-CM | POA: Diagnosis not present

## 2015-02-22 DIAGNOSIS — O99331 Smoking (tobacco) complicating pregnancy, first trimester: Secondary | ICD-10-CM | POA: Insufficient documentation

## 2015-02-22 LAB — URINE MICROSCOPIC-ADD ON

## 2015-02-22 LAB — WET PREP, GENITAL
Trich, Wet Prep: NONE SEEN
Yeast Wet Prep HPF POC: NONE SEEN

## 2015-02-22 LAB — URINALYSIS, ROUTINE W REFLEX MICROSCOPIC
Bilirubin Urine: NEGATIVE
GLUCOSE, UA: NEGATIVE mg/dL
KETONES UR: NEGATIVE mg/dL
Nitrite: NEGATIVE
Specific Gravity, Urine: 1.025 (ref 1.005–1.030)
UROBILINOGEN UA: 0.2 mg/dL (ref 0.0–1.0)
pH: 7 (ref 5.0–8.0)

## 2015-02-22 LAB — POC URINE PREG, ED: PREG TEST UR: POSITIVE — AB

## 2015-02-22 LAB — HCG, QUANTITATIVE, PREGNANCY: hCG, Beta Chain, Quant, S: 74042 m[IU]/mL — ABNORMAL HIGH (ref <5)

## 2015-02-22 LAB — ABO/RH: ABO/RH(D): O POS

## 2015-02-22 MED ORDER — METRONIDAZOLE 500 MG PO TABS: 500.0000 mg | ORAL_TABLET | Freq: Two times a day (BID) | ORAL | Status: DC

## 2015-02-22 NOTE — Discharge Instructions (Signed)
First Trimester of Pregnancy The first trimester of pregnancy is from week 1 until the end of week 12 (months 1 through 3). A week after a sperm fertilizes an egg, the egg will implant on the wall of the uterus. This embryo will begin to develop into a baby. Genes from you and your partner are forming the baby. The female genes determine whether the baby is a boy or a girl. At 6-8 weeks, the eyes and face are formed, and the heartbeat can be seen on ultrasound. At the end of 12 weeks, all the baby's organs are formed.  Now that you are pregnant, you will want to do everything you can to have a healthy baby. Two of the most important things are to get good prenatal care and to follow your health care provider's instructions. Prenatal care is all the medical care you receive before the baby's birth. This care will help prevent, find, and treat any problems during the pregnancy and childbirth. BODY CHANGES Your body goes through many changes during pregnancy. The changes vary from woman to woman.   You may gain or lose a couple of pounds at first.  You may feel sick to your stomach (nauseous) and throw up (vomit). If the vomiting is uncontrollable, call your health care provider.  You may tire easily.  You may develop headaches that can be relieved by medicines approved by your health care provider.  You may urinate more often. Painful urination may mean you have a bladder infection.  You may develop heartburn as a result of your pregnancy.  You may develop constipation because certain hormones are causing the muscles that push waste through your intestines to slow down.  You may develop hemorrhoids or swollen, bulging veins (varicose veins).  Your breasts may begin to grow larger and become tender. Your nipples may stick out more, and the tissue that surrounds them (areola) may become darker.  Your gums may bleed and may be sensitive to brushing and flossing.  Dark spots or blotches (chloasma,  mask of pregnancy) may develop on your face. This will likely fade after the baby is born.  Your menstrual periods will stop.  You may have a loss of appetite.  You may develop cravings for certain kinds of food.  You may have changes in your emotions from day to day, such as being excited to be pregnant or being concerned that something may go wrong with the pregnancy and baby.  You may have more vivid and strange dreams.  You may have changes in your hair. These can include thickening of your hair, rapid growth, and changes in texture. Some women also have hair loss during or after pregnancy, or hair that feels dry or thin. Your hair will most likely return to normal after your baby is born. WHAT TO EXPECT AT YOUR PRENATAL VISITS During a routine prenatal visit:  You will be weighed to make sure you and the baby are growing normally.  Your blood pressure will be taken.  Your abdomen will be measured to track your baby's growth.  The fetal heartbeat will be listened to starting around week 10 or 12 of your pregnancy.  Test results from any previous visits will be discussed. Your health care provider may ask you:  How you are feeling.  If you are feeling the baby move.  If you have had any abnormal symptoms, such as leaking fluid, bleeding, severe headaches, or abdominal cramping.  If you have any questions. Other tests  that may be performed during your first trimester include:  Blood tests to find your blood type and to check for the presence of any previous infections. They will also be used to check for low iron levels (anemia) and Rh antibodies. Later in the pregnancy, blood tests for diabetes will be done along with other tests if problems develop.  Urine tests to check for infections, diabetes, or protein in the urine.  An ultrasound to confirm the proper growth and development of the baby.  An amniocentesis to check for possible genetic problems.  Fetal screens for  spina bifida and Down syndrome.  You may need other tests to make sure you and the baby are doing well. HOME CARE INSTRUCTIONS  Medicines  Follow your health care provider's instructions regarding medicine use. Specific medicines may be either safe or unsafe to take during pregnancy.  Take your prenatal vitamins as directed.  If you develop constipation, try taking a stool softener if your health care provider approves. Diet  Eat regular, well-balanced meals. Choose a variety of foods, such as meat or vegetable-based protein, fish, milk and low-fat dairy products, vegetables, fruits, and whole grain breads and cereals. Your health care provider will help you determine the amount of weight gain that is right for you.  Avoid raw meat and uncooked cheese. These carry germs that can cause birth defects in the baby.  Eating four or five small meals rather than three large meals a day may help relieve nausea and vomiting. If you start to feel nauseous, eating a few soda crackers can be helpful. Drinking liquids between meals instead of during meals also seems to help nausea and vomiting.  If you develop constipation, eat more high-fiber foods, such as fresh vegetables or fruit and whole grains. Drink enough fluids to keep your urine clear or pale yellow. Activity and Exercise  Exercise only as directed by your health care provider. Exercising will help you:  Control your weight.  Stay in shape.  Be prepared for labor and delivery.  Experiencing pain or cramping in the lower abdomen or low back is a good sign that you should stop exercising. Check with your health care provider before continuing normal exercises.  Try to avoid standing for long periods of time. Move your legs often if you must stand in one place for a long time.  Avoid heavy lifting.  Wear low-heeled shoes, and practice good posture.  You may continue to have sex unless your health care provider directs you  otherwise. Relief of Pain or Discomfort  Wear a good support bra for breast tenderness.   Take warm sitz baths to soothe any pain or discomfort caused by hemorrhoids. Use hemorrhoid cream if your health care provider approves.   Rest with your legs elevated if you have leg cramps or low back pain.  If you develop varicose veins in your legs, wear support hose. Elevate your feet for 15 minutes, 3-4 times a day. Limit salt in your diet. Prenatal Care  Schedule your prenatal visits by the twelfth week of pregnancy. They are usually scheduled monthly at first, then more often in the last 2 months before delivery.  Write down your questions. Take them to your prenatal visits.  Keep all your prenatal visits as directed by your health care provider. Safety  Wear your seat belt at all times when driving.  Make a list of emergency phone numbers, including numbers for family, friends, the hospital, and police and fire departments. General Tips  Ask your health care provider for a referral to a local prenatal education class. Begin classes no later than at the beginning of month 6 of your pregnancy.  Ask for help if you have counseling or nutritional needs during pregnancy. Your health care provider can offer advice or refer you to specialists for help with various needs.  Do not use hot tubs, steam rooms, or saunas.  Do not douche or use tampons or scented sanitary pads.  Do not cross your legs for long periods of time.  Avoid cat litter boxes and soil used by cats. These carry germs that can cause birth defects in the baby and possibly loss of the fetus by miscarriage or stillbirth.  Avoid all smoking, herbs, alcohol, and medicines not prescribed by your health care provider. Chemicals in these affect the formation and growth of the baby.  Schedule a dentist appointment. At home, brush your teeth with a soft toothbrush and be gentle when you floss. SEEK MEDICAL CARE IF:   You have  dizziness.  You have mild pelvic cramps, pelvic pressure, or nagging pain in the abdominal area.  You have persistent nausea, vomiting, or diarrhea.  You have a bad smelling vaginal discharge.  You have pain with urination.  You notice increased swelling in your face, hands, legs, or ankles. SEEK IMMEDIATE MEDICAL CARE IF:   You have a fever.  You are leaking fluid from your vagina.  You have spotting or bleeding from your vagina.  You have severe abdominal cramping or pain.  You have rapid weight gain or loss.  You vomit blood or material that looks like coffee grounds.  You are exposed to [place] measles and have never had them.  You are exposed to fifth disease or chickenpox.  You develop a severe headache.  You have shortness of breath.  You have any kind of trauma, such as from a fall or a car accident. Document Released: 10/04/2001 Document Revised: 02/24/2014 Document Reviewed: 08/20/2013 [facility] Patient Information 2015 [place], [place]. This information is not intended to replace advice given to you by your health care provider. Make sure you discuss any questions you have with your health care provider.  Vaginitis Vaginitis is an inflammation of the vagina. It is most often caused by a change in the normal balance of the bacteria and yeast that live in the vagina. This change in balance causes an overgrowth of certain bacteria or yeast, which causes the inflammation. There are different types of vaginitis, but the most common types are:  Bacterial vaginosis.  Yeast infection (candidiasis).  Trichomoniasis vaginitis. This is a sexually transmitted infection (STI).  Viral vaginitis.  Atropic vaginitis.  Allergic vaginitis. CAUSES  The cause depends on the type of vaginitis. Vaginitis can be caused by:  Bacteria (bacterial vaginosis).  Yeast (yeast infection).  A parasite (trichomoniasis vaginitis)  A virus (viral vaginitis).  Low hormone levels  (atrophic vaginitis). Low hormone levels can occur during pregnancy, breastfeeding, or after menopause.  Irritants, such as bubble baths, scented tampons, and feminine sprays (allergic vaginitis). Other factors can change the normal balance of the yeast and bacteria that live in the vagina. These include:  Antibiotic medicines.  Poor hygiene.  Diaphragms, vaginal sponges, spermicides, birth control pills, and intrauterine devices (IUD).  Sexual intercourse.  Infection.  Uncontrolled diabetes.  A weakened immune system. SYMPTOMS  Symptoms can vary depending on the cause of the vaginitis. Common symptoms include:  Abnormal vaginal discharge.  The discharge is white, gray, or yellow  with bacterial vaginosis.  The discharge is thick, white, and cheesy with a yeast infection.  The discharge is frothy and yellow or greenish with trichomoniasis.  A bad vaginal odor.  The odor is fishy with bacterial vaginosis.  Vaginal itching, pain, or swelling.  Painful intercourse.  Pain or burning when urinating. Sometimes, there are no symptoms. TREATMENT  Treatment will vary depending on the type of infection.   Bacterial vaginosis and trichomoniasis are often treated with antibiotic creams or pills.  Yeast infections are often treated with antifungal medicines, such as vaginal creams or suppositories.  Viral vaginitis has no cure, but symptoms can be treated with medicines that relieve discomfort. Your sexual partner should be treated as well.  Atrophic vaginitis may be treated with an estrogen cream, pill, suppository, or vaginal ring. If vaginal dryness occurs, lubricants and moisturizing creams may help. You may be told to avoid scented soaps, sprays, or douches.  Allergic vaginitis treatment involves quitting the use of the product that is causing the problem. Vaginal creams can be used to treat the symptoms. HOME CARE INSTRUCTIONS   Take all medicines as directed by your  caregiver.  Keep your genital area clean and dry. Avoid soap and only rinse the area with water.  Avoid douching. It can remove the healthy bacteria in the vagina.  Do not use tampons or have sexual intercourse until your vaginitis has been treated. Use sanitary pads while you have vaginitis.  Wipe from front to back. This avoids the spread of bacteria from the rectum to the vagina.  Let air reach your genital area.  Wear cotton underwear to decrease moisture buildup.  Avoid wearing underwear while you sleep until your vaginitis is gone.  Avoid tight pants and underwear or nylons without a cotton panel.  Take off wet clothing (especially bathing suits) as soon as possible.  Use mild, non-scented products. Avoid using irritants, such as:  Scented feminine sprays.  Fabric softeners.  Scented detergents.  Scented tampons.  Scented soaps or bubble baths.  Practice safe sex and use condoms. Condoms may prevent the spread of trichomoniasis and viral vaginitis. SEEK MEDICAL CARE IF:   You have abdominal pain.  You have a fever or persistent symptoms for more than 2-3 days.  You have a fever and your symptoms suddenly get worse. Document Released: 08/07/2007 Document Revised: 07/04/2012 Document Reviewed: 03/22/2012 [facility] Patient Information 2015 [place], [place]. This information is not intended to replace advice given to you by your health care provider. Make sure you discuss any questions you have with your health care provider.

## 2015-02-22 NOTE — ED Provider Notes (Signed)
CSN: [id number]     Arrival date & time 02/22/15  1003 History   First MD Initiated Contact with Patient 02/22/15 1107     Chief Complaint  Patient presents with  . Abdominal Pain     (Consider location/radiation/quality/duration/timing/severity/associated sxs/prior Treatment) HPI   Alicia [name] is a 24 y.o. female  . She presents for evaluation of lower abdominal pain present for 2 days, intermittently it feels like a soreness. He denies vaginal discharge or bleeding. Her last menstrual period was about one week ago. She does not have any dysuria, urinary frequency, or hematuria. She has not had any fever, chills, nausea, vomiting, weakness or dizziness. She delivered a child about 11 months ago. There are no other known modifying factors.  Past Medical History  Diagnosis Date  . Urinary tract infection   . HSV-2 infection   . Heavy menstrual period 04/28/2014  . Vaginal itching 12/31/2014  . Vaginal discharge 12/31/2014  . Yeast infection 12/31/2014  . Contraceptive management 01/22/2015   Past Surgical History  Procedure Laterality Date  . No past surgeries     Family History  Problem Relation Age of Onset  . Hypertension Mother   . Anemia Mother   . Cancer Maternal Grandmother     breast  . Heart attack Paternal Grandfather    History  Substance Use Topics  . Smoking status: Current Every Day Smoker -- 0.02 packs/day for 2 years    Types: Cigarettes  . Smokeless tobacco: Never Used  . Alcohol Use: No   OB History    Gravida Para Term Preterm AB TAB SAB Ectopic Multiple Living   1 1 1       1      Review of Systems  All other systems reviewed and are negative.     Allergies  Penicillins and Sulfa antibiotics  Home Medications   Prior to Admission medications   Medication Sig Start Date End Date Taking? Authorizing Provider  ibuprofen (ADVIL,MOTRIN) 200 MG tablet Take 200 mg by mouth every 6 (six) hours as needed for headache, moderate pain or cramping.   Yes  Historical Provider, MD  Norethindrone-Ethinyl Estradiol-Fe Biphas (LO LOESTRIN FE) 1 MG-10 MCG / 10 MCG tablet Take 1 tablet by mouth daily. 01/22/15  Yes Estill Dooms, NP  metroNIDAZOLE (FLAGYL) 500 MG tablet Take 1 tablet (500 mg total) by mouth 2 (two) times daily. One po bid x 7 days 02/22/15   Daleen Bo, MD   BP 117/76 mmHg  Pulse 99  Temp(Src) 97.9 F (36.6 C) (Oral)  Resp 16  Ht 5\' 2"  (1.575 m)  Wt 118 lb (53.524 kg)  BMI 21.58 kg/m2  SpO2 100%  LMP 02/15/2015 Physical Exam  Constitutional: She is oriented to person, place, and time. She appears well-developed and well-nourished.  HENT:  Head: Normocephalic and atraumatic.  Right Ear: External ear normal.  Left Ear: External ear normal.  Eyes: Conjunctivae and EOM are normal. Pupils are equal, round, and reactive to light.  Neck: Normal range of motion and phonation normal. Neck supple.  Cardiovascular: Normal rate, regular rhythm and normal heart sounds.   Pulmonary/Chest: Effort normal and breath sounds normal. She exhibits no bony tenderness.  Abdominal: Soft. There is tenderness (bilateral suprapubic tenderness, mild).  Genitourinary:  Pelvic examination- grayish brown vaginal discharge is present. Small amount of blood per cervical os. Cervix appears normal. It is long and closed. Bimanual examination there is no uterine tenderness or enlargement. There is no right adnexal  tenderness or mass. There is diffuse left adnexal tenderness, without mass.  Musculoskeletal: Normal range of motion.  Neurological: She is alert and oriented to person, place, and time. No cranial nerve deficit or sensory deficit. She exhibits normal muscle tone. Coordination normal.  Skin: Skin is warm, dry and intact.  Psychiatric: She has a normal mood and affect. Her behavior is normal. Judgment and thought content normal.  Nursing note and vitals reviewed.   ED Course  Procedures (including critical care time)  11:30- Pelvic ultrasound  ordered for the risk of ectopic pregnancy. Will proceed with pelvic examination as well. Pelvic exam completed at 13:10    Labs Review Labs Reviewed  WET PREP, GENITAL - Abnormal; Notable for the following:    Clue Cells Wet Prep HPF POC MANY (*)    WBC, Wet Prep HPF POC MANY (*)    All other components within normal limits  URINALYSIS, ROUTINE W REFLEX MICROSCOPIC - Abnormal; Notable for the following:    APPearance HAZY (*)    Hgb urine dipstick TRACE (*)    Protein, ur TRACE (*)    Leukocytes, UA MODERATE (*)    All other components within normal limits  HCG, QUANTITATIVE, PREGNANCY - Abnormal; Notable for the following:    hCG, Beta Chain, Quant, S 74042 (*)    All other components within normal limits  POC URINE PREG, ED - Abnormal; Notable for the following:    Preg Test, Ur POSITIVE (*)    All other components within normal limits  URINE CULTURE  URINE MICROSCOPIC-ADD ON  RPR  HIV ANTIBODY (ROUTINE TESTING)  ABO/RH  GC/CHLAMYDIA PROBE AMP ([place])    Imaging Review US Ob Comp Less 14 Wks  02/22/2015   CLINICAL DATA:  Pregnant, pelvic pain x2 days  EXAM: OBSTETRIC <14 WK [place] AND TRANSVAGINAL OB US  TECHNIQUE: Both transabdominal and transvaginal ultrasound examinations were performed for complete evaluation of the gestation as well as the maternal uterus, adnexal regions, and pelvic cul-de-sac. Transvaginal technique was performed to assess early pregnancy.  COMPARISON:  None.  FINDINGS: Intrauterine gestational sac: Visualized/normal in shape.  Yolk sac:  Present  Embryo:  Present  Cardiac Activity: Present  Heart Rate: 147  bpm  CRL:  10.4  mm   7 w   1 d                  [place] EDC: 10/10/2015  Maternal uterus/adnexae: Small subchronic hemorrhage.  Bilateral ovaries are within normal limits.  No free fluid.  IMPRESSION: Single live intrauterine gestation with estimated gestational age [redacted] weeks 1 day by crown-rump length.   Electronically Signed   By: Julian Hy M.D.    On: 02/22/2015 12:35   US Ob Transvaginal  02/22/2015   CLINICAL DATA:  Pregnant, pelvic pain x2 days  EXAM: OBSTETRIC <14 WK [place] AND TRANSVAGINAL OB US  TECHNIQUE: Both transabdominal and transvaginal ultrasound examinations were performed for complete evaluation of the gestation as well as the maternal uterus, adnexal regions, and pelvic cul-de-sac. Transvaginal technique was performed to assess early pregnancy.  COMPARISON:  None.  FINDINGS: Intrauterine gestational sac: Visualized/normal in shape.  Yolk sac:  Present  Embryo:  Present  Cardiac Activity: Present  Heart Rate: 147  bpm  CRL:  10.4  mm   7 w   1 d                  [place] EDC: 10/10/2015  Maternal uterus/adnexae: Small subchronic  hemorrhage.  Bilateral ovaries are within normal limits.  No free fluid.  IMPRESSION: Single live intrauterine gestation with estimated gestational age [redacted] weeks 1 day by crown-rump length.   Electronically Signed   By: Julian Hy M.D.   On: 02/22/2015 12:35     EKG Interpretation None      MDM   Final diagnoses:  Pelvic pain in female  First trimester pregnancy  Vaginal discharge  Nonspecific vaginitis    First trimester pregnancy with nonspecific lower abdominal pain. No apparent pregnancy complications. Nonspecific vaginitis is present. A minimal bleeding per cervical os, is likely related to vaginitis. Doubt impending spontaneous abortion. She has A positive; Rhogram is not indicated.  Nursing Notes Reviewed/ Care Coordinated Applicable Imaging Reviewed Interpretation of Laboratory Data incorporated into ED treatment  The patient appears reasonably screened and/or stabilized for discharge and I doubt any other medical condition or other [facility] requiring further screening, evaluation, or treatment in the ED at this time prior to discharge.  Plan: Home Medications- Flagyl; Home Treatments- rest; return here if the recommended treatment, does not improve the symptoms; Recommended follow up- OB f/u  asap    Daleen Bo, MD 02/22/15 1513

## 2015-02-22 NOTE — ED Notes (Signed)
Patient c/o lower abd pain with nausea x2 days. Patient states that pain occasionally radiates to upper abd quadrants. Denies any vomiting, fevers, diarrhea, or urinary symptoms.

## 2015-02-23 LAB — GC/CHLAMYDIA PROBE AMP (~~LOC~~) NOT AT ARMC
Chlamydia: NEGATIVE
Neisseria Gonorrhea: NEGATIVE

## 2015-02-23 LAB — HIV ANTIBODY (ROUTINE TESTING W REFLEX): HIV SCREEN 4TH GENERATION: NONREACTIVE

## 2015-02-23 LAB — RPR: RPR: NONREACTIVE

## 2015-02-24 ENCOUNTER — Encounter: Payer: Self-pay | Admitting: Women's Health

## 2015-02-24 ENCOUNTER — Ambulatory Visit (INDEPENDENT_AMBULATORY_CARE_PROVIDER_SITE_OTHER): Payer: Managed Care, Other (non HMO) | Admitting: Women's Health

## 2015-02-24 VITALS — BP 108/58 | HR 96 | Wt 115.0 lb

## 2015-02-24 DIAGNOSIS — N760 Acute vaginitis: Secondary | ICD-10-CM

## 2015-02-24 DIAGNOSIS — A499 Bacterial infection, unspecified: Secondary | ICD-10-CM

## 2015-02-24 DIAGNOSIS — Z331 Pregnant state, incidental: Secondary | ICD-10-CM | POA: Diagnosis not present

## 2015-02-24 DIAGNOSIS — Z3490 Encounter for supervision of normal pregnancy, unspecified, unspecified trimester: Secondary | ICD-10-CM

## 2015-02-24 DIAGNOSIS — B9689 Other specified bacterial agents as the cause of diseases classified elsewhere: Secondary | ICD-10-CM

## 2015-02-24 MED ORDER — CONCEPT DHA 53.5-38-1 MG PO CAPS: 1.0000 | ORAL_CAPSULE | Freq: Every day | ORAL | Status: DC

## 2015-02-24 NOTE — Patient Instructions (Signed)
Bacterial Vaginosis Bacterial vaginosis is a vaginal infection that occurs when the normal balance of bacteria in the vagina is disrupted. It results from an overgrowth of certain bacteria. This is the most common vaginal infection in women of childbearing age. Treatment is important to prevent complications, especially in pregnant women, as it can cause a premature delivery. CAUSES  Bacterial vaginosis is caused by an increase in harmful bacteria that are normally present in smaller amounts in the vagina. Several different kinds of bacteria can cause bacterial vaginosis. However, the reason that the condition develops is not fully understood. RISK FACTORS Certain activities or behaviors can put you at an increased risk of developing bacterial vaginosis, including:  Having a new sex partner or multiple sex partners.  Douching.  Using an intrauterine device (IUD) for contraception. Women do not get bacterial vaginosis from toilet seats, bedding, swimming pools, or contact with objects around them. SIGNS AND SYMPTOMS  Some women with bacterial vaginosis have no signs or symptoms. Common symptoms include:  Grey vaginal discharge.  A fishlike odor with discharge, especially after sexual intercourse.  Itching or burning of the vagina and vulva.  Burning or pain with urination. DIAGNOSIS  Your health care provider will take a medical history and examine the vagina for signs of bacterial vaginosis. A sample of vaginal fluid may be taken. Your health care provider will look at this sample under a microscope to check for bacteria and abnormal cells. A vaginal pH test may also be done.  TREATMENT  Bacterial vaginosis may be treated with antibiotic medicines. These may be given in the form of a pill or a vaginal cream. A second round of antibiotics may be prescribed if the condition comes back after treatment.  HOME CARE INSTRUCTIONS   Only take over-the-counter or prescription medicines as  directed by your health care provider.  If antibiotic medicine was prescribed, take it as directed. Make sure you finish it even if you start to feel better.  Do not have sex until treatment is completed.  Tell all sexual partners that you have a vaginal infection. They should see their health care provider and be treated if they have problems, such as a mild rash or itching.  Practice safe sex by using condoms and only having one sex partner. SEEK MEDICAL CARE IF:   Your symptoms are not improving after 3 days of treatment.  You have increased discharge or pain.  You have a fever. MAKE SURE YOU:   Understand these instructions.  Will watch your condition.  Will get help right away if you are not doing well or get worse. FOR MORE INFORMATION  Centers for Disease Control and Prevention, Division of STD Prevention: www.cdc.gov/std American Sexual Health Association (ASHA): www.ashastd.org  Document Released: 10/10/2005 Document Revised: 07/31/2013 Document Reviewed: 05/22/2013 [employer] Patient Information 2015 [employer]. This information is not intended to replace advice given to you by your health care provider. Make sure you discuss any questions you have with your health care provider.  

## 2015-02-24 NOTE — Progress Notes (Addendum)
Patient ID: Alicia [name], female   DOB: 09/01/1991, 24 y.o.   MRN: [id number]   [facility] Visit  Patient name: Alicia [name] MRN [id number]  Date of birth: [birth year]  CC & HPI:  Alicia [name] is a 24 y.o. G85P1001 African American female @ ~7wks presenting today for f/u after er visit 5/1 for abdominal pain- was dx w/ BV & notified she was pregnant, which was a shock to her as she is taking COCs, however she did miss 2 pills. Alicia [name] is only 35 months old. She has not started taking flagyl as she was unsure if it was ok during pregnancy. Hasn't started pnv. 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  GC/CT neg at ED.   Pertinent History Reviewed:  Medical & Surgical Hx:   Past Medical History  Diagnosis Date  . Urinary tract infection   . HSV-2 infection   . Heavy menstrual period 04/28/2014  . Vaginal itching 12/31/2014  . Vaginal discharge 12/31/2014  . Yeast infection 12/31/2014  . Contraceptive management 01/22/2015   Past Surgical History  Procedure Laterality Date  . No past surgeries     Medications: Reviewed & Updated - see associated section Social History: Reviewed -  reports that she has been smoking Cigarettes.  She has a .04 pack-year smoking history. She has never used smokeless tobacco.  Objective Findings:  Vitals: BP 108/58 mmHg  Pulse 96  Wt 115 lb (52.164 kg)  LMP 02/15/2015  Physical Examination: General appearance - alert, well appearing, and in no distress  No results found for this or any previous visit (from the past 24 hour(s)).   Assessment & Plan:  A:   ~[redacted]wks pregnant  BV  Short interval pregnancy P:  OK to take flagyl for BV  Rx  Concept dha w/ 11RF  F/U as scheduled 5/12 for new ob u/s, try to get new ob visit same day  Note given for work for no lifting/pulling/pushing >25lbs    Tawnya Crook CNM, [facility] 02/24/2015 4:28 PM

## 2015-02-25 LAB — URINE CULTURE: Colony Count: >=100000

## 2015-02-26 ENCOUNTER — Telehealth: Payer: Self-pay | Admitting: Emergency Medicine

## 2015-02-26 NOTE — Progress Notes (Signed)
ED Antimicrobial Stewardship Positive Culture Follow Up   Alicia [name] is an 24 y.o. female who presented to [facility] on 02/22/2015 with a chief complaint of lower abdominal pain and nausea x2 days.  Pt denied vomiting, diarrhea, fever, and urinary symptoms.  At that time pt was found to be pregnant (~7wks).  HIV, RPR, gonorrhea, and chlamydia were all negative.  Wet prep was positive for clue cells >> sent home with Rx for metronidazole for bacterial vaginosis.  Pt now with culture and sensitivity positive for proteus mirabilis, resistant to nitrofurantoin.  Pt was also seen by Ob-Gyn yesterday, but C&S likely hadn't resulted at that time.  Chief Complaint  Patient presents with  . Abdominal Pain    Recent Results (from the past 720 hour(s))  Urine culture     Status: None   Collection Time: 02/22/15 10:25 AM  Result Value Ref Range Status   Specimen Description URINE, CLEAN CATCH  Final   Special Requests NONE  Final   Colony Count   Final    >=100,000 COLONIES/ML Performed at [facility]    Culture   Final    PROTEUS MIRABILIS Performed at [facility]    Report Status 02/25/2015 FINAL  Final   Organism ID, Bacteria PROTEUS MIRABILIS  Final      Susceptibility   Proteus mirabilis - MIC*    AMPICILLIN <=2 SENSITIVE Sensitive     CEFAZOLIN <=4 SENSITIVE Sensitive     CEFTRIAXONE <=1 SENSITIVE Sensitive     CIPROFLOXACIN <=0.25 SENSITIVE Sensitive     GENTAMICIN <=1 SENSITIVE Sensitive     LEVOFLOXACIN <=0.12 SENSITIVE Sensitive     NITROFURANTOIN 128 RESISTANT Resistant     TOBRAMYCIN <=1 SENSITIVE Sensitive     TRIMETH/SULFA <=20 SENSITIVE Sensitive     PIP/TAZO <=4 SENSITIVE Sensitive     * PROTEUS MIRABILIS  Wet prep, genital     Status: Abnormal   Collection Time: 02/22/15  3:00 PM  Result Value Ref Range Status   Yeast Wet Prep HPF POC NONE SEEN NONE SEEN Final   Trich, Wet Prep NONE SEEN NONE SEEN Final   Clue Cells Wet Prep HPF POC MANY (A)  NONE SEEN Final   WBC, Wet Prep HPF POC MANY (A) NONE SEEN Final    [x]  Patient discharged originally without antimicrobial agent to treat UTI and treatment is now indicated d/t UTI in pregnancy  *Note, pt has documented penicillin allergy (hives).  Culture shows resistance to nitroruantoin.  Cross reactivity between penicillins and cephalosporins is low. Keflex is likely our best option given pregnancy status, documented allergies, and C&S.  Keflex is safe during pregnancy.  New antibiotic prescription: Keflex 500mg  Q12h x7 days   ED Provider: Monico Blitz, PA-C   Obelia Bonello K 02/26/2015, 10:54 AM Infectious Diseases Pharmacist Phone# [phone]

## 2015-02-27 ENCOUNTER — Telehealth (HOSPITAL_BASED_OUTPATIENT_CLINIC_OR_DEPARTMENT_OTHER): Payer: Self-pay | Admitting: Emergency Medicine

## 2015-02-27 NOTE — Telephone Encounter (Signed)
Post ED Visit - Positive Culture Follow-up: Successful Patient Follow-Up  Culture assessed and recommendations reviewed by: []  [place], Pharm.D., BCPS [x]  Heide Guile, Pharm.D., BCPS []  Alycia Rossetti, Pharm.D., BCPS []  [place], Pharm.D., BCPS, AAHIVP []  Legrand Como, Pharm.D., BCPS, AAHIVP []  Hassie Bruce, Pharm.D. []  Milus Glazier, Pharm.D.  Positive urine culture proteus  [x]  Patient discharged without antimicrobial prescription and treatment is now indicated []  Organism is resistant to prescribed ED discharge antimicrobial []  Patient with positive blood cultures  Changes discussed with ED provider: [place] antibiotic prescription Keflex 500mg  po bid x 7days Called to [facility] patient, 02/27/15 1110   Hazle Nordmann 02/27/2015, 11:09 AM

## 2015-03-03 ENCOUNTER — Other Ambulatory Visit: Payer: Self-pay | Admitting: Obstetrics and Gynecology

## 2015-03-03 DIAGNOSIS — O3680X1 Pregnancy with inconclusive fetal viability, fetus 1: Secondary | ICD-10-CM

## 2015-03-05 ENCOUNTER — Ambulatory Visit (INDEPENDENT_AMBULATORY_CARE_PROVIDER_SITE_OTHER): Payer: Managed Care, Other (non HMO)

## 2015-03-05 ENCOUNTER — Ambulatory Visit (INDEPENDENT_AMBULATORY_CARE_PROVIDER_SITE_OTHER): Payer: Medicaid Other | Admitting: Advanced Practice Midwife

## 2015-03-05 ENCOUNTER — Encounter: Payer: Self-pay | Admitting: Advanced Practice Midwife

## 2015-03-05 VITALS — BP 96/52 | HR 96 | Wt 115.2 lb

## 2015-03-05 DIAGNOSIS — R894 Abnormal immunological findings in specimens from other organs, systems and tissues: Secondary | ICD-10-CM

## 2015-03-05 DIAGNOSIS — Z3490 Encounter for supervision of normal pregnancy, unspecified, unspecified trimester: Secondary | ICD-10-CM | POA: Insufficient documentation

## 2015-03-05 DIAGNOSIS — Z3A08 8 weeks gestation of pregnancy: Secondary | ICD-10-CM | POA: Diagnosis not present

## 2015-03-05 DIAGNOSIS — Z1389 Encounter for screening for other disorder: Secondary | ICD-10-CM | POA: Diagnosis not present

## 2015-03-05 DIAGNOSIS — Z369 Encounter for antenatal screening, unspecified: Secondary | ICD-10-CM

## 2015-03-05 DIAGNOSIS — Z3491 Encounter for supervision of normal pregnancy, unspecified, first trimester: Secondary | ICD-10-CM

## 2015-03-05 DIAGNOSIS — O2341 Unspecified infection of urinary tract in pregnancy, first trimester: Secondary | ICD-10-CM

## 2015-03-05 DIAGNOSIS — O09891 Supervision of other high risk pregnancies, first trimester: Secondary | ICD-10-CM

## 2015-03-05 DIAGNOSIS — R768 Other specified abnormal immunological findings in serum: Secondary | ICD-10-CM

## 2015-03-05 DIAGNOSIS — O3680X1 Pregnancy with inconclusive fetal viability, fetus 1: Secondary | ICD-10-CM | POA: Diagnosis not present

## 2015-03-05 DIAGNOSIS — O2391 Unspecified genitourinary tract infection in pregnancy, first trimester: Secondary | ICD-10-CM | POA: Diagnosis not present

## 2015-03-05 DIAGNOSIS — Z331 Pregnant state, incidental: Secondary | ICD-10-CM | POA: Diagnosis not present

## 2015-03-05 DIAGNOSIS — Z0283 Encounter for blood-alcohol and blood-drug test: Secondary | ICD-10-CM

## 2015-03-05 DIAGNOSIS — Z3682 Encounter for antenatal screening for nuchal translucency: Secondary | ICD-10-CM

## 2015-03-05 MED ORDER — CEFTRIAXONE SODIUM 1 G IJ SOLR
1.0000 g | Freq: Once | INTRAMUSCULAR | Status: AC
  Administered 2015-03-05: 1 g via INTRAMUSCULAR

## 2015-03-05 NOTE — Progress Notes (Signed)
Subjective:    Alicia [name] is a G2P1001 [redacted]w[redacted]d being seen today for her first obstetrical visit.  Her obstetrical history is significant for short interval bt pregnancies (10 months).  Missed 2 BC pills. .  Pregnancy history fully reviewed. She was indyuced at 38 weeks for cholestasis (bile acids 20).   Patient reports itching in her legs like when she had cholestasis. Marland Kitchen FOB wants her to have an abortion, and says if she doesn't he is going to terminate his rights to both their son and this kid.(he rarely sees him no)  Alicia [name] has lots of familial support, doing OK.  Has UTI resistant to macrobid, but allergic to PCN and sulfa.Cant tolerated flagyl for asymptomatic BV. Had dating [place] today.   Filed Vitals:   03/05/15 1547  BP: 96/52  Pulse: 96  Weight: 115 lb 4 oz (52.277 kg)    HISTORY: OB History  Gravida Para Term Preterm AB SAB TAB Ectopic Multiple Living  2 1 1       1     # Outcome Date GA Lbr Len/2nd Weight Sex Delivery Anes PTL Lv  2 Current           1 Term 03/08/14 [redacted]w[redacted]d 11:05 / 07:02 5 lb 11.9 oz (2.605 kg) M Vag-Spont EPI  Y     Past Medical History  Diagnosis Date  . Urinary tract infection   . HSV-2 infection   . Heavy menstrual period 04/28/2014  . Vaginal itching 12/31/2014  . Vaginal discharge 12/31/2014  . Yeast infection 12/31/2014  . Contraceptive management 01/22/2015   Past Surgical History  Procedure Laterality Date  . No past surgeries     Family History  Problem Relation Age of Onset  . Hypertension Mother   . Anemia Mother   . Cancer Maternal Grandmother     breast  . Heart attack Paternal Grandfather      Exam                                           Skin: normal coloration and turgor, no rashes    Neurologic: oriented, normal, normal mood   Extremities: normal strength, tone, and muscle mass   HEENT PERRLA   Mouth/Teeth mucous membranes moist, normal dentition   Neck supple and no masses   Cardiovascular: regular rate and  rhythm   Respiratory:  appears well, vitals normal, no respiratory distress, acyanotic   Abdomen: soft, non-tender;  FHR: 154          Assessment:    Pregnancy: G2P1001 Patient Active Problem List   Diagnosis Date Noted  . Short interval between pregnancies affecting pregnancy in first trimester, antepartum 03/05/2015  . Supervision of normal pregnancy 03/05/2015  . Vaginal itching 12/31/2014  . Vaginal discharge 12/31/2014  . Yeast infection 12/31/2014  . BV (bacterial vaginosis) 12/01/2014  . HSV-2 seropositive 12/30/2013        Plan:     Initial labs drawn. Continue prenatal vitamins  Stop Flagyl until 2nd trimester, then can tx with metrogel Rocephin 1gm IM for UTI Problem list reviewed and updated  Check bile acids next visit (fasting) if still itching Reviewed n/v relief measures and warning s/s to report  Reviewed recommended weight gain based on pre-gravid BMI  Encouraged well-balanced diet Genetic Screening discussed Integrated Screen: requested.  Ultrasound discussed; fetal survey: requested.  Follow up in 4  weeks for NT/IT.  Alicia [name] 03/05/2015

## 2015-03-05 NOTE — Progress Notes (Signed)
[place] 8+4WKS IUP  w/YS,pos fht 170bpm,normal ov's bilat,edd 10/11/2015

## 2015-03-05 NOTE — Addendum Note (Signed)
Addended by: Doyne Keel on: 03/05/2015 04:46 PM   Modules accepted: Orders

## 2015-03-10 LAB — PMP SCREEN PROFILE (10S), URINE
Amphetamine Screen, Ur: NEGATIVE ng/mL
Barbiturate Screen, Ur: NEGATIVE ng/mL
Benzodiazepine Screen, Urine: NEGATIVE ng/mL
COCAINE(METAB.) SCREEN, URINE: NEGATIVE ng/mL
CREATININE(CRT), U: 165.5 mg/dL (ref 20.0–300.0)
Cannabinoids Ur Ql Scn: POSITIVE ng/mL
METHADONE SCREEN, URINE: NEGATIVE ng/mL
Opiate Scrn, Ur: NEGATIVE ng/mL
Oxycodone+Oxymorphone Ur Ql Scn: NEGATIVE ng/mL
PCP Scrn, Ur: NEGATIVE ng/mL
PROPOXYPHENE SCREEN: NEGATIVE ng/mL
Ph of Urine: 7.9 (ref 4.5–8.9)

## 2015-03-10 LAB — CBC
Hematocrit: 33.6 % — ABNORMAL LOW (ref 34.0–46.6)
Hemoglobin: 10.9 g/dL — ABNORMAL LOW (ref 11.1–15.9)
MCH: 24.8 pg — ABNORMAL LOW (ref 26.6–33.0)
MCHC: 32.4 g/dL (ref 31.5–35.7)
MCV: 76 fL — ABNORMAL LOW (ref 79–97)
PLATELETS: 322 10*3/uL (ref 150–379)
RBC: 4.4 x10E6/uL (ref 3.77–5.28)
RDW: 18.3 % — ABNORMAL HIGH (ref 12.3–15.4)
WBC: 8.6 10*3/uL (ref 3.4–10.8)

## 2015-03-10 LAB — VARICELLA ZOSTER ANTIBODY, IGG: Varicella zoster IgG: 2251 index (ref >165)

## 2015-03-10 LAB — CYSTIC FIBROSIS MUTATION 97: GENE DIS ANAL CARRIER INTERP BLD/T-IMP: NOT DETECTED

## 2015-03-10 LAB — RUBELLA SCREEN: RUBELLA: 7.26 {index} (ref >0.99)

## 2015-03-10 LAB — HIV ANTIBODY (ROUTINE TESTING W REFLEX): HIV Screen 4th Generation wRfx: NONREACTIVE

## 2015-03-10 LAB — ANTIBODY SCREEN: ANTIBODY SCREEN: NEGATIVE

## 2015-03-10 LAB — HEPATITIS B SURFACE ANTIGEN: Hepatitis B Surface Ag: NEGATIVE

## 2015-04-02 ENCOUNTER — Ambulatory Visit (INDEPENDENT_AMBULATORY_CARE_PROVIDER_SITE_OTHER): Payer: Managed Care, Other (non HMO) | Admitting: Advanced Practice Midwife

## 2015-04-02 ENCOUNTER — Ambulatory Visit (INDEPENDENT_AMBULATORY_CARE_PROVIDER_SITE_OTHER): Payer: Managed Care, Other (non HMO)

## 2015-04-02 VITALS — BP 108/60 | HR 84 | Wt 116.4 lb

## 2015-04-02 DIAGNOSIS — Z3A12 12 weeks gestation of pregnancy: Secondary | ICD-10-CM | POA: Diagnosis not present

## 2015-04-02 DIAGNOSIS — Z3682 Encounter for antenatal screening for nuchal translucency: Secondary | ICD-10-CM

## 2015-04-02 DIAGNOSIS — Z3491 Encounter for supervision of normal pregnancy, unspecified, first trimester: Secondary | ICD-10-CM | POA: Diagnosis not present

## 2015-04-02 DIAGNOSIS — O26849 Uterine size-date discrepancy, unspecified trimester: Secondary | ICD-10-CM

## 2015-04-02 DIAGNOSIS — Z1389 Encounter for screening for other disorder: Secondary | ICD-10-CM

## 2015-04-02 DIAGNOSIS — Z36 Encounter for antenatal screening of mother: Secondary | ICD-10-CM | POA: Diagnosis not present

## 2015-04-02 DIAGNOSIS — Z331 Pregnant state, incidental: Secondary | ICD-10-CM | POA: Diagnosis not present

## 2015-04-02 LAB — POCT URINALYSIS DIPSTICK
Blood, UA: NEGATIVE
Glucose, UA: NEGATIVE
KETONES UA: NEGATIVE
Leukocytes, UA: NEGATIVE
Nitrite, UA: NEGATIVE

## 2015-04-02 NOTE — Progress Notes (Signed)
[place] 12+4WKS,single iup pos, fht 148,normal ov's,NB present,NT 1.64mm

## 2015-04-02 NOTE — Progress Notes (Signed)
G2P1001 [redacted]w[redacted]d Estimated Date of Delivery: 10/11/15  Last menstrual period 02/15/2015, unknown if currently breastfeeding.   BP weight and urine results all reviewed and noted.  Please refer to the obstetrical flow sheet for the fundal height and fetal heart rate documentation:  Patient denies any bleeding and no rupture of membranes symptoms or regular contractions.  [place] 12+4WKS,single iup pos, fht 148,normal ov's,NB present,NT 1.54mm          Patient is without complaints. All questions were answered.  Plan:  Continued routine obstetrical care,   Follow up in 4 weeks for OB appointment,

## 2015-04-04 LAB — MATERNAL SCREEN, INTEGRATED #1
Crown Rump Length: 70.4 mm
Gest. Age on Collection Date: 13 weeks
MATERNAL AGE AT EDD: 24.7 a
Nuchal Translucency (NT): 1.6 mm
Number of Fetuses: 1
PAPP-A Value: 1695.5 ng/mL
Weight: 116 [lb_av]

## 2015-04-09 ENCOUNTER — Encounter (HOSPITAL_COMMUNITY): Payer: Self-pay | Admitting: *Deleted

## 2015-04-09 ENCOUNTER — Emergency Department (HOSPITAL_COMMUNITY)
Admission: EM | Admit: 2015-04-09 | Discharge: 2015-04-09 | Disposition: A | Discharge status: Home / Self Care | Payer: Managed Care, Other (non HMO) | Attending: Emergency Medicine | Admitting: Emergency Medicine

## 2015-04-09 DIAGNOSIS — R55 Syncope and collapse: Secondary | ICD-10-CM | POA: Diagnosis not present

## 2015-04-09 DIAGNOSIS — Z8619 Personal history of other infectious and parasitic diseases: Secondary | ICD-10-CM | POA: Insufficient documentation

## 2015-04-09 DIAGNOSIS — Z3A12 12 weeks gestation of pregnancy: Secondary | ICD-10-CM | POA: Diagnosis not present

## 2015-04-09 DIAGNOSIS — O2341 Unspecified infection of urinary tract in pregnancy, first trimester: Secondary | ICD-10-CM | POA: Diagnosis not present

## 2015-04-09 DIAGNOSIS — E860 Dehydration: Secondary | ICD-10-CM | POA: Diagnosis not present

## 2015-04-09 DIAGNOSIS — R8271 Bacteriuria: Secondary | ICD-10-CM

## 2015-04-09 DIAGNOSIS — O9989 Other specified diseases and conditions complicating pregnancy, childbirth and the puerperium: Secondary | ICD-10-CM | POA: Diagnosis present

## 2015-04-09 DIAGNOSIS — O99281 Endocrine, nutritional and metabolic diseases complicating pregnancy, first trimester: Secondary | ICD-10-CM | POA: Insufficient documentation

## 2015-04-09 DIAGNOSIS — O99011 Anemia complicating pregnancy, first trimester: Secondary | ICD-10-CM | POA: Insufficient documentation

## 2015-04-09 DIAGNOSIS — Z880 Allergy status to penicillin: Secondary | ICD-10-CM | POA: Diagnosis not present

## 2015-04-09 DIAGNOSIS — Z87891 Personal history of nicotine dependence: Secondary | ICD-10-CM | POA: Insufficient documentation

## 2015-04-09 DIAGNOSIS — Z872 Personal history of diseases of the skin and subcutaneous tissue: Secondary | ICD-10-CM | POA: Insufficient documentation

## 2015-04-09 DIAGNOSIS — D649 Anemia, unspecified: Secondary | ICD-10-CM | POA: Diagnosis not present

## 2015-04-09 LAB — I-STAT CHEM 8, ED
BUN: 8 mg/dL (ref 6–20)
Calcium, Ion: 1.09 mmol/L — ABNORMAL LOW (ref 1.12–1.23)
Chloride: 104 mmol/L (ref 101–111)
Creatinine, Ser: 0.5 mg/dL (ref 0.44–1.00)
GLUCOSE: 126 mg/dL — AB (ref 65–99)
HEMATOCRIT: 27 % — AB (ref 36.0–46.0)
HEMOGLOBIN: 9.2 g/dL — AB (ref 12.0–15.0)
Potassium: 3.3 mmol/L — ABNORMAL LOW (ref 3.5–5.1)
SODIUM: 133 mmol/L — AB (ref 135–145)
TCO2: 17 mmol/L (ref 0–100)

## 2015-04-09 LAB — URINALYSIS, ROUTINE W REFLEX MICROSCOPIC
BILIRUBIN URINE: NEGATIVE
GLUCOSE, UA: NEGATIVE mg/dL
Hgb urine dipstick: NEGATIVE
KETONES UR: NEGATIVE mg/dL
Nitrite: NEGATIVE
Protein, ur: NEGATIVE mg/dL
Specific Gravity, Urine: 1.02 (ref 1.005–1.030)
Urobilinogen, UA: 0.2 mg/dL (ref 0.0–1.0)
pH: 6 (ref 5.0–8.0)

## 2015-04-09 LAB — URINE MICROSCOPIC-ADD ON

## 2015-04-09 LAB — CBG MONITORING, ED: GLUCOSE-CAPILLARY: 114 mg/dL — AB (ref 65–99)

## 2015-04-09 MED ORDER — NITROFURANTOIN MONOHYD MACRO 100 MG PO CAPS: 100.0000 mg | ORAL_CAPSULE | Freq: Two times a day (BID) | ORAL | Status: DC

## 2015-04-09 MED ORDER — SODIUM CHLORIDE 0.9 % IV BOLUS (SEPSIS)
1000.0000 mL | Freq: Once | INTRAVENOUS | Status: AC
  Administered 2015-04-09: 1000 mL via INTRAVENOUS

## 2015-04-09 NOTE — ED Provider Notes (Signed)
CSN: [id number]     Arrival date & time 04/09/15  1349 History   First MD Initiated Contact with Patient 04/09/15 1349     Chief Complaint  Patient presents with  . Loss of Consciousness     (Consider location/radiation/quality/duration/timing/severity/associated sxs/prior Treatment) HPI Comments: The patient is a 24 year old female, she has a history of urinary tract infection in the past, she is G2 P1 at approximately 14 weeks by the patient's report, approximately 12 weeks based on ultrasound report from the gynecologist office. She was seen approximately one week ago for routine prenatal care, is supposed to be taking a prenatal vitamin but does not take it. 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 The next thing the patient remembers she was in the ambulance on the ride to the hospital. At this time she denies any complaints at all. She feels that she does have a dry mouth, she endorses eating a single bag of chips and having no other oral intake today. The symptoms are persistent, improved after transport to the hospital, not associated with swelling, fever, chills, cough or shortness of breath or abdominal pain. She denies vaginal bleeding or dysuria  The patient's mother who is here with her states that [name] has had several episodes like this in the past where she feels like she is getting overheated and then has a syncopal episode or she is unable to sit down.  Patient is a 24 y.o. female presenting with syncope. The history is provided by the patient.  Loss of Consciousness   Past Medical History  Diagnosis Date  . Urinary tract infection   . HSV-2 infection   . Heavy menstrual period  04/28/2014  . Vaginal itching 12/31/2014  . Vaginal discharge 12/31/2014  . Yeast infection 12/31/2014  . Contraceptive management 01/22/2015   Past Surgical History  Procedure Laterality Date  . No past surgeries     Family History  Problem Relation Age of Onset  . Hypertension Mother   . Anemia Mother   . Cancer Maternal Grandmother     breast  . Heart attack Paternal Grandfather    History  Substance Use Topics  . Smoking status: Former Smoker -- 0.02 packs/day for 2 years    Types: Cigarettes  . Smokeless tobacco: Never Used  . Alcohol Use: No   OB History    Gravida Para Term Preterm AB TAB SAB Ectopic Multiple Living   2 1 1       1      Review of Systems  Cardiovascular: Positive for syncope.  All other systems reviewed and are negative.     Allergies  Penicillins and Sulfa antibiotics  Home Medications   Prior to Admission medications   Medication Sig Start Date End Date Taking? Authorizing Provider  nitrofurantoin, macrocrystal-monohydrate, (MACROBID) 100 MG capsule Take 1 capsule (100 mg total) by mouth 2 (two) times daily. 04/09/15   Noemi Chapel, MD  Prenat-FeFum-FePo-FA-Omega 3 (CONCEPT DHA) 53.5-38-1 MG CAPS Take 1 capsule by mouth daily. Patient not taking: Reported on 04/09/2015 02/24/15   Roma Schanz, CNM   BP 92/67 mmHg  Pulse 92  Temp(Src) 98.5 F (36.9 C) (Oral)  Resp 21  Ht 5\' 5"  (1.651 m)  SpO2 100%  LMP 02/15/2015 Physical Exam  Constitutional: She appears well-developed and well-nourished. No distress.  HENT:  Head: Normocephalic and atraumatic.  Mouth/Throat: Oropharynx is clear and moist. No oropharyngeal exudate.  Eyes: Conjunctivae and EOM are normal. Pupils are equal, round, and reactive to light. Right eye exhibits no discharge. Left eye exhibits no discharge. No scleral icterus.  Neck: Normal range of motion. Neck supple. No JVD present. No thyromegaly present.  Cardiovascular: Normal rate, regular rhythm, normal heart sounds and  intact distal pulses.  Exam reveals no gallop and no friction rub.   No murmur heard. Pulmonary/Chest: Effort normal and breath sounds normal. No respiratory distress. She has no wheezes. She has no rales.  Abdominal: Soft. Bowel sounds are normal. She exhibits no distension and no mass. There is no tenderness.  Musculoskeletal: Normal range of motion. She exhibits no edema or tenderness.  Lymphadenopathy:    She has no cervical adenopathy.  Neurological: She is alert. Coordination normal.  Speech is clear, cranial nerves III through XII are intact, memory is intact, strength is normal in all 4 extremities including grips, sensation is intact to light touch and pinprick in all 4 extremities. Coordination as tested by finger-nose-finger is normal, no limb ataxia. Normal gait, normal reflexes at the patellar tendons bilaterally  Skin: Skin is warm and dry. No rash noted. No erythema.  Psychiatric: She has a normal mood and affect. Her behavior is normal.  Nursing note and vitals reviewed.   ED Course  Procedures (including critical care time) Labs Review Labs Reviewed  URINALYSIS, ROUTINE W REFLEX MICROSCOPIC (NOT AT [facility]) - Abnormal; Notable for the following:    Leukocytes, UA SMALL (*)    All other components within normal limits  URINE MICROSCOPIC-ADD ON - Abnormal; Notable for the following:    Squamous Epithelial / LPF MANY (*)    Bacteria, UA MANY (*)    All other components within normal limits  CBG MONITORING, ED - Abnormal; Notable for the following:    Glucose-Capillary 114 (*)    All other components within normal limits  I-STAT CHEM 8, ED - Abnormal; Notable for the following:    Sodium 133 (*)    Potassium 3.3 (*)    Glucose, Bld 126 (*)    Calcium, Ion 1.09 (*)    Hemoglobin 9.2 (*)    HCT 27.0 (*)    All other components within normal limits  URINE CULTURE    Imaging Review No results found.   EKG Interpretation   Date/Time:  Thursday April 09 2015 13:56:50  EDT Ventricular Rate:  99 PR Interval:  131 QRS Duration: 97 QT Interval:  333 QTC Calculation: 427 R Axis:   78 Text Interpretation:  Sinus rhythm Normal ECG since last tracing no  significant change Confirmed by Kersti Scavone  MD, Karalyn Kadel ([id number]) on 04/09/2015  2:08:54 PM      MDM   Final diagnoses:  Syncope, unspecified syncope type  Dehydration  Bacteriuria  Anemia, unspecified anemia type    Vital signs are normal, EKG is unremarkable, labs show a slight hypokalemia, slight hyponatremia, anemia of 9.2 which is approximately 1 g lower than when checked at her last visit. This was on an i-STAT chemistry, less practical, the patient denies any blood losses, this can be followed up in the office. She will be informed of all of her results prior to discharge.  Check orthostatics, check urinalysis, anticipate discharge in the  care of her mother, I discussed the plan with both mother and the patient who aren't agreement.  Hydrate  Urinalysis shows bacteriuria, likely contaminant, patient has mild hypotension, she has no change in blood pressure with standing, she has been able to ambulate to the bathroom and back without difficulty, she has no fever, no tachycardia, no abdominal pain. I have performed a quick bedside ultrasound just to evaluate for fetal location and fetal heart tones, the heart tones are approximately 150 bpm, there is good fetal movements, this is an intrauterine pregnancy based on this bedside study. At this time the patient will be discharged home in stable condition, urine culture ordered, Macrobid, fluids, bed rest for 24 hours to hydrate. Patient and family member are in agreement with the plan, they have informed of the results.  Meds given in ED:  Medications  sodium chloride 0.9 % bolus 1,000 mL (1,000 mLs Intravenous New Bag/Given 04/09/15 1433)    New Prescriptions   NITROFURANTOIN, MACROCRYSTAL-MONOHYDRATE, (MACROBID) 100 MG CAPSULE    Take 1 capsule (100 mg total)  by mouth 2 (two) times daily.      Noemi Chapel, MD 04/09/15 1534

## 2015-04-09 NOTE — ED Notes (Signed)
Pt is ambulating to the restroom with no difficulties.

## 2015-04-09 NOTE — Discharge Instructions (Signed)
Please have your OBGYN check your urine sample and your hemoglobin (red blood cells) at your next visit - you should be seen in next week or recheck.  Macrobid twice daily Tylenol for pain  Drink plenty of fluids,  Please call your doctor for a followup appointment within 24-48 hours. When you talk to your doctor please let them know that you were seen in the emergency department and have them acquire all of your records so that they can discuss the findings with you and formulate a treatment plan to fully care for your new and ongoing problems.

## 2015-04-09 NOTE — ED Notes (Signed)
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  Pt is opening eyes to voice and touch but is not speaking at this time. EMS states she was speaking in route. Pt is also pregnant with due date of 10-11-2015.  Pt has been previously pregnant with same episode occuring. Pt does having knot on her left forehead, had been there since childhood.

## 2015-04-09 NOTE — ED Notes (Signed)
FHR by doppler: 141

## 2015-04-09 NOTE — ED Notes (Signed)
Per EMS, pt has x1 episode of emesis in route and was given 4 mg Zofran

## 2015-04-11 LAB — URINE CULTURE: Culture: 70000

## 2015-04-13 ENCOUNTER — Encounter: Payer: Self-pay | Admitting: Women's Health

## 2015-04-13 ENCOUNTER — Ambulatory Visit (INDEPENDENT_AMBULATORY_CARE_PROVIDER_SITE_OTHER): Payer: Managed Care, Other (non HMO) | Admitting: Women's Health

## 2015-04-13 ENCOUNTER — Telehealth (HOSPITAL_COMMUNITY): Payer: Self-pay

## 2015-04-13 VITALS — BP 110/62 | HR 80 | Wt 117.0 lb

## 2015-04-13 DIAGNOSIS — O26892 Other specified pregnancy related conditions, second trimester: Secondary | ICD-10-CM | POA: Diagnosis not present

## 2015-04-13 DIAGNOSIS — R42 Dizziness and giddiness: Secondary | ICD-10-CM

## 2015-04-13 DIAGNOSIS — O99012 Anemia complicating pregnancy, second trimester: Secondary | ICD-10-CM

## 2015-04-13 DIAGNOSIS — M7918 Myalgia, other site: Secondary | ICD-10-CM

## 2015-04-13 DIAGNOSIS — Z3492 Encounter for supervision of normal pregnancy, unspecified, second trimester: Secondary | ICD-10-CM

## 2015-04-13 DIAGNOSIS — Z3A14 14 weeks gestation of pregnancy: Secondary | ICD-10-CM | POA: Diagnosis not present

## 2015-04-13 DIAGNOSIS — Z1389 Encounter for screening for other disorder: Secondary | ICD-10-CM | POA: Diagnosis not present

## 2015-04-13 DIAGNOSIS — D649 Anemia, unspecified: Secondary | ICD-10-CM | POA: Diagnosis not present

## 2015-04-13 DIAGNOSIS — Z331 Pregnant state, incidental: Secondary | ICD-10-CM

## 2015-04-13 DIAGNOSIS — M545 Low back pain: Secondary | ICD-10-CM

## 2015-04-13 LAB — POCT URINALYSIS DIPSTICK
Blood, UA: NEGATIVE
Glucose, UA: NEGATIVE
KETONES UA: NEGATIVE
LEUKOCYTES UA: NEGATIVE
Nitrite, UA: NEGATIVE

## 2015-04-13 MED ORDER — FERROUS SULFATE 325 (65 FE) MG PO TABS: 325.0000 mg | ORAL_TABLET | Freq: Two times a day (BID) | ORAL | Status: DC

## 2015-04-13 NOTE — Patient Instructions (Addendum)
Keep taking antibiotics Increase water intake, enough so that urine is clear Use proper body mechanics when lifting your child Use tylenol and warm baths for aches/pains Let us know if you begin to have any bleeding or leakage of fluid  For Dizzy Spells:   This is usually related to either your blood sugar or your blood pressure dropping  Make sure you are staying well hydrated and drinking enough water so that your urine is clear  Eat small frequent meals and snacks containing protein (meat, eggs, nuts, cheese) so that your blood sugar doesn't drop  If you do get dizzy, sit/lay down and get you something to drink and a snack containing protein- you will usually start feeling better in 10-20 minutes   Constipation  Drink plenty of fluid, preferably water, throughout the day  Eat foods high in fiber such as fruits, vegetables, and grains  Exercise, such as walking, is a good way to keep your bowels regular  Drink warm fluids, especially warm prune juice, or decaf coffee  Eat a 1/2 cup of real oatmeal (not instant), 1/2 cup applesauce, and 1/2-1 cup warm prune juice every day  If needed, you may take Colace (docusate sodium) stool softener once or twice a day to help keep the stool soft. If you are pregnant, wait until you are out of your first trimester (12-14 weeks of pregnancy)  If you still are having problems with constipation, you may take Miralax once daily as needed to help keep your bowels regular.  If you are pregnant, wait until you are out of your first trimester (12-14 weeks of pregnancy)

## 2015-04-13 NOTE — Progress Notes (Signed)
Work-in Low-risk OB appointment [id number] [redacted]w[redacted]d Estimated Date of Delivery: 10/10/15 BP 110/62 mmHg  Pulse 80  Wt 117 lb (53.071 kg)  LMP 02/15/2015  BP, weight, and urine reviewed.  Refer to obstetrical flow sheet for FH & FHR.  No fm yet. Denies cramping, lof, vb, or uti s/s. Reports low-mid pelvic pain that began at 0700 this am while at work at [employer]. Is constant and sharp/achy, doesn't feel like pressure/cramping. Hasn't been lifting anything heavy at work. 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 Discussed using proper body mechanics when lifting child. Sounds musculoskeletal. Dx w/ UTI on 6/16 by ED and taking macrobid as rx'd. Was seen in ED that day b/c she passed out while working in [employer]- was hot outside that day. Has been dizzy, lightheaded lately. Hgb 10.9 at new ob visit. Is taking pnv daily. Will add Fe bid. Gave info on dizzy spells- to increase protein and po hydration. Let us know if vb or lof or pain worsens. Can take warm baths, apap as needed.  Reviewed warning s/s to report. Plan:  Continue routine obstetrical care  F/U as scheduled for OB appointment

## 2015-04-13 NOTE — Telephone Encounter (Signed)
Post ED Visit - Positive Culture Follow-up  Culture report reviewed by antimicrobial stewardship pharmacist: []  Wes Dulaney, Pharm.D., BCPS []  Heide Guile, Pharm.D., BCPS [x]  Alycia Rossetti, Pharm.D., BCPS []  Hurricane, Pharm.D., BCPS, AAHIVP []  Legrand Como, Pharm.D., BCPS, AAHIVP []  Isac Sarna, Pharm.D., BCPS  Positive Urine culture, >/= 70,000 colonies -> Lactobacillus Species Treated with Nitrofurantoin, organism sensitive to the same and no further patient follow-up is required at this time.  Dortha Kern 04/13/2015, 4:50 AM

## 2015-04-29 ENCOUNTER — Encounter: Payer: Self-pay | Admitting: Adult Health

## 2015-04-29 ENCOUNTER — Encounter: Payer: Managed Care, Other (non HMO) | Admitting: Adult Health

## 2015-05-04 ENCOUNTER — Telehealth: Payer: Self-pay | Admitting: *Deleted

## 2015-05-04 NOTE — Telephone Encounter (Signed)
Pt states that she had an abortion done Wednesday. [facility], had abortion done there and wants to know if we could fill out FMLA papers for her to return to work in 2 weeks.  Pt states that she was told by [facility] when she called that she could have gone back to work within a few days and that she did not need that long out of work. I advised the pt that because we did not do the procedure that we would not be able to fill out the FMLA forms. The pt stated that there was no way that she could have gone back to work within a few days because she had a surgical procedure done. I again advised the pt to call the facility and ask them if they could do the FMLA papers because we would not be able to do those for her. Pt verbalized understanding.

## 2015-05-25 ENCOUNTER — Encounter (HOSPITAL_COMMUNITY): Payer: Self-pay | Admitting: *Deleted

## 2015-05-25 ENCOUNTER — Emergency Department (HOSPITAL_COMMUNITY): Payer: Managed Care, Other (non HMO)

## 2015-05-25 ENCOUNTER — Emergency Department (HOSPITAL_COMMUNITY)
Admission: EM | Admit: 2015-05-25 | Discharge: 2015-05-25 | Disposition: A | Discharge status: Home / Self Care | Payer: Managed Care, Other (non HMO) | Attending: Emergency Medicine | Admitting: Emergency Medicine

## 2015-05-25 DIAGNOSIS — O034 Incomplete spontaneous abortion without complication: Secondary | ICD-10-CM

## 2015-05-25 DIAGNOSIS — Z87440 Personal history of urinary (tract) infections: Secondary | ICD-10-CM | POA: Diagnosis not present

## 2015-05-25 DIAGNOSIS — Z79899 Other long term (current) drug therapy: Secondary | ICD-10-CM | POA: Insufficient documentation

## 2015-05-25 DIAGNOSIS — Z880 Allergy status to penicillin: Secondary | ICD-10-CM | POA: Diagnosis not present

## 2015-05-25 DIAGNOSIS — Z8619 Personal history of other infectious and parasitic diseases: Secondary | ICD-10-CM | POA: Insufficient documentation

## 2015-05-25 DIAGNOSIS — R102 Pelvic and perineal pain unspecified side: Secondary | ICD-10-CM

## 2015-05-25 DIAGNOSIS — Z87891 Personal history of nicotine dependence: Secondary | ICD-10-CM | POA: Insufficient documentation

## 2015-05-25 DIAGNOSIS — Z3202 Encounter for pregnancy test, result negative: Secondary | ICD-10-CM | POA: Diagnosis not present

## 2015-05-25 DIAGNOSIS — N939 Abnormal uterine and vaginal bleeding, unspecified: Secondary | ICD-10-CM

## 2015-05-25 LAB — I-STAT CHEM 8, ED
BUN: 8 mg/dL (ref 6–20)
CALCIUM ION: 1.17 mmol/L (ref 1.12–1.23)
CHLORIDE: 102 mmol/L (ref 101–111)
Creatinine, Ser: 0.6 mg/dL (ref 0.44–1.00)
Glucose, Bld: 80 mg/dL (ref 65–99)
HEMATOCRIT: 35 % — AB (ref 36.0–46.0)
Hemoglobin: 11.9 g/dL — ABNORMAL LOW (ref 12.0–15.0)
POTASSIUM: 3.6 mmol/L (ref 3.5–5.1)
SODIUM: 139 mmol/L (ref 135–145)
TCO2: 22 mmol/L (ref 0–100)

## 2015-05-25 LAB — CBC WITH DIFFERENTIAL/PLATELET
Basophils Absolute: 0.1 10*3/uL (ref 0.0–0.1)
Basophils Relative: 1 % (ref 0–1)
Eosinophils Absolute: 0.2 10*3/uL (ref 0.0–0.7)
Eosinophils Relative: 3 % (ref 0–5)
HCT: 33.4 % — ABNORMAL LOW (ref 36.0–46.0)
Hemoglobin: 10.4 g/dL — ABNORMAL LOW (ref 12.0–15.0)
LYMPHS ABS: 2.6 10*3/uL (ref 0.7–4.0)
LYMPHS PCT: 34 % (ref 12–46)
MCH: 25.4 pg — AB (ref 26.0–34.0)
MCHC: 31.1 g/dL (ref 30.0–36.0)
MCV: 81.5 fL (ref 78.0–100.0)
MONOS PCT: 7 % (ref 3–12)
Monocytes Absolute: 0.5 10*3/uL (ref 0.1–1.0)
NEUTROS ABS: 4.4 10*3/uL (ref 1.7–7.7)
NEUTROS PCT: 56 % (ref 43–77)
Platelets: 249 10*3/uL (ref 150–400)
RBC: 4.1 MIL/uL (ref 3.87–5.11)
RDW: 15.8 % — ABNORMAL HIGH (ref 11.5–15.5)
WBC: 7.8 10*3/uL (ref 4.0–10.5)

## 2015-05-25 LAB — URINALYSIS, ROUTINE W REFLEX MICROSCOPIC
Bilirubin Urine: NEGATIVE
Glucose, UA: NEGATIVE mg/dL
KETONES UR: NEGATIVE mg/dL
NITRITE: NEGATIVE
Protein, ur: NEGATIVE mg/dL
Specific Gravity, Urine: 1.02 (ref 1.005–1.030)
UROBILINOGEN UA: 0.2 mg/dL (ref 0.0–1.0)
pH: 6.5 (ref 5.0–8.0)

## 2015-05-25 LAB — I-STAT BETA HCG BLOOD, ED (MC, WL, AP ONLY): I-stat hCG, quantitative: <5 m[IU]/mL (ref <5)

## 2015-05-25 LAB — URINE MICROSCOPIC-ADD ON

## 2015-05-25 MED ORDER — MISOPROSTOL 200 MCG PO TABS: 800.0000 ug | ORAL_TABLET | Freq: Once | ORAL | Status: DC

## 2015-05-25 MED ORDER — HYDROCODONE-ACETAMINOPHEN 5-325 MG PO TABS: 1.0000 | ORAL_TABLET | Freq: Four times a day (QID) | ORAL | Status: DC | PRN

## 2015-05-25 NOTE — Consult Note (Signed)
Ed Consult note  Patient name: Alicia [name] MRN [id number]  Date of birth: November 13, 1990  CC & HPI:  Alicia [name] is a 24 y.o. female presenting today for continued cramping and bleeding 4 wks after D&E for pregnancy temination. U/s shows a small 1.4 cm tissue fragment in the uterine cavity, with HCG negative , <5, so no viable tissue felt to remain.   ROS:  Has appt 8/3 in [place] with Nigel Berthold for post-ab followup but cramping has increased today  Pertinent History Reviewed:   Reviewed: Significant for rh pos in old chart Medical         Past Medical History  Diagnosis Date  . Urinary tract infection   . HSV-2 infection   . Heavy menstrual period 04/28/2014  . Vaginal itching 12/31/2014  . Vaginal discharge 12/31/2014  . Yeast infection 12/31/2014  . Contraceptive management 01/22/2015                              Surgical Hx:    Past Surgical History  Procedure Laterality Date  . No past surgeries     Medications: Reviewed & Updated - see associated section                      No current facility-administered medications for this encounter.  Current outpatient prescriptions:  .  acetaminophen (TYLENOL) 500 MG tablet, Take 500 mg by mouth every 6 (six) hours as needed for moderate pain., Disp: , Rfl:  .  nitrofurantoin, macrocrystal-monohydrate, (MACROBID) 100 MG capsule, Take 1 capsule (100 mg total) by mouth 2 (two) times daily. (Patient not taking: Reported on 05/25/2015), Disp: 10 capsule, Rfl: 0 .  Prenat-FeFum-FePo-FA-Omega 3 (CONCEPT DHA) 53.5-38-1 MG CAPS, Take 1 capsule by mouth daily. (Patient not taking: Reported on 05/25/2015), Disp: 30 capsule, Rfl: 11   Social History: Reviewed -  reports that she has quit smoking. Her smoking use included Cigarettes. She has a .04 pack-year smoking history. She has never used smokeless tobacco.  Objective Findings:  Vitals: Blood pressure 123/73, pulse 80, temperature 98.5 F (36.9 C), temperature source Oral, resp.  rate 16, height 5\' 2"  (1.575 m), weight 123 lb (55.792 kg), last menstrual period 02/15/2015, SpO2 100 %, unknown if currently breastfeeding.  Physical Examination: General appearance - alert, well appearing, and in no distress, oriented to person, place, and time, normal appearing weight and ill-appearing Mental status - alert, oriented to person, place, and time, normal mood, behavior, speech, dress, motor activity, and thought processes, depressed mood Eyes - pupils equal and reactive, extraocular eye movements intact Abdomen - soft, nontender, nondistended, no masses or organomegaly Pelvic - VULVA: normal appearing vulva with no masses, tenderness or lesions, VAGINA: normal appearing vagina with normal color and discharge, no lesions, lite bloody d/c , nonpurulent , CERVIX: normal appearing cervix without discharge or lesions, multiparous os, dark red lochia rubra , UTERUS: anteverted, mobile, slight tenderness, to bimanual, negative cmt , ADNEXA: normal adnexa in size, nontender and no masses, exam chaperoned by Rn Extremities - peripheral pulses normal, no pedal edema, no clubbing or cyanosis  CBC    Component Value Date/Time   WBC 7.8 05/25/2015 1532   WBC 8.6 03/05/2015 1645   RBC 4.10 05/25/2015 1532   RBC 4.40 03/05/2015 1645   HGB 11.9* 05/25/2015 1553   HCT 35.0* 05/25/2015 1553   HCT 33.6* 03/05/2015 1645   PLT 249 05/25/2015  1532   MCV 81.5 05/25/2015 1532   MCH 25.4* 05/25/2015 1532   MCH 24.8* 03/05/2015 1645   MCHC 31.1 05/25/2015 1532   MCHC 32.4 03/05/2015 1645   RDW 15.8* 05/25/2015 1532   RDW 18.3* 03/05/2015 1645   LYMPHSABS 2.6 05/25/2015 1532   MONOABS 0.5 05/25/2015 1532   EOSABS 0.2 05/25/2015 1532   BASOSABS 0.1 05/25/2015 1532      Assessment & Plan:   A:  1. Residual tissue fragments s/p el ab, no living tissue still attached 2. Rh pos P  1. Rx cytotec 800 mcg po now,  2 Recheck Wednesday 3. Rx Vicodin

## 2015-05-25 NOTE — ED Notes (Signed)
Pt comes in for lower abdominal pain lasting 3 weeks. Pt has abortion 3 weeks ago and has increased pain/cramping since. Pt states she is taking ibuprofen with no relief of pain. Pt states her bleeding has been consistently 3-4 pads a day. NAD noted.

## 2015-05-25 NOTE — ED Provider Notes (Signed)
CSN: [id number]     Arrival date & time 05/25/15  1329 History   First MD Initiated Contact with Patient 05/25/15 1342     Chief Complaint  Patient presents with  . Pelvic Pain     HPI  Pt was seen at 1350.  Per pt, c/o gradual onset and persistence of constant vaginal bleeding for the past 3+ weeks. Has been associated with pelvic "cramping" pain. Pt states her symptoms began after she had an elective TOP approximately July 6th. States EGA at that time was "about 15 or 16 weeks." Pt has been taking motrin without relief. States she has an OB/GYN appt at [facility] in 2 days. Denies dysuria/hematuria, no flank pain, no N/V/D, no fevers, no rash.    Past Medical History  Diagnosis Date  . Urinary tract infection   . HSV-2 infection   . Heavy menstrual period 04/28/2014  . Vaginal itching 12/31/2014  . Vaginal discharge 12/31/2014  . Yeast infection 12/31/2014  . Contraceptive management 01/22/2015   Past Surgical History  Procedure Laterality Date  . No past surgeries     Family History  Problem Relation Age of Onset  . Hypertension Mother   . Anemia Mother   . Cancer Maternal Grandmother     breast  . Heart attack Paternal Grandfather    History  Substance Use Topics  . Smoking status: Former Smoker -- 0.02 packs/day for 2 years    Types: Cigarettes  . Smokeless tobacco: Never Used  . Alcohol Use: No   OB History    Gravida Para Term Preterm AB TAB SAB Ectopic Multiple Living   2 1 1       1      Review of Systems ROS: Statement: All systems negative except as marked or noted in the HPI; Constitutional: Negative for fever and chills. ; ; Eyes: Negative for eye pain, redness and discharge. ; ; ENMT: Negative for ear pain, hoarseness, nasal congestion, sinus pressure and sore throat. ; ; Cardiovascular: Negative for chest pain, palpitations, diaphoresis, dyspnea and peripheral edema. ; ; Respiratory: Negative for cough, wheezing and stridor. ; ; Gastrointestinal: Negative for  nausea, vomiting, diarrhea, abdominal pain, blood in stool, hematemesis, jaundice and rectal bleeding. . ; ; Genitourinary: Negative for dysuria, flank pain and hematuria. ; ; GYN:  +pelvic pain, +vaginal bleeding, no vaginal discharge, no vulvar pain. ;; Musculoskeletal: Negative for back pain and neck pain. Negative for swelling and trauma.; ; Skin: Negative for pruritus, rash, abrasions, blisters, bruising and skin lesion.; ; Neuro: Negative for headache, lightheadedness and neck stiffness. Negative for weakness, altered level of consciousness , altered mental status, extremity weakness, paresthesias, involuntary movement, seizure and syncope.      Allergies  Penicillins and Sulfa antibiotics  Home Medications   Prior to Admission medications   Medication Sig Start Date End Date Taking? Authorizing Provider  ferrous sulfate 325 (65 FE) MG tablet Take 1 tablet (325 mg total) by mouth 2 (two) times daily with a meal. 04/13/15   Roma Schanz, CNM  nitrofurantoin, macrocrystal-monohydrate, (MACROBID) 100 MG capsule Take 1 capsule (100 mg total) by mouth 2 (two) times daily. 04/09/15   Noemi Chapel, MD  Prenat-FeFum-FePo-FA-Omega 3 (CONCEPT DHA) 53.5-38-1 MG CAPS Take 1 capsule by mouth daily. 02/24/15   Roma Schanz, CNM   BP 123/73 mmHg  Pulse 80  Temp(Src) 98.5 F (36.9 C) (Oral)  Resp 16  Ht 5\' 2"  (1.575 m)  Wt 123 lb (55.792 kg)  BMI 22.49 kg/m2  SpO2 100%  LMP 02/15/2015 Physical Exam  1355: Physical examination:  Nursing notes reviewed; Vital signs and O2 SAT reviewed;  Constitutional: Well developed, Well nourished, Well hydrated, In no acute distress; Head:  Normocephalic, atraumatic; Eyes: EOMI, PERRL, No scleral icterus; ENMT: Mouth and pharynx normal, Mucous membranes moist; Neck: Supple, Full range of motion, No lymphadenopathy; Cardiovascular: Regular rate and rhythm, No murmur, rub, or gallop; Respiratory: Breath sounds clear & equal bilaterally, No rales, rhonchi,  wheezes.  Speaking full sentences with ease, Normal respiratory effort/excursion; Chest: Nontender, Movement normal; Abdomen: Soft, +suprapubic tenderness to palp. No rebound or guarding. Nondistended, Normal bowel sounds; Genitourinary: No CVA tenderness; Extremities: Pulses normal, No deformity. No edema, No calf edema or asymmetry.; Neuro: AA&Ox3, Major CN grossly intact.  Speech clear. No gross focal motor or sensory deficits in extremities.; Skin: Color normal, Warm, Dry.   ED Course  Procedures     EKG Interpretation None      MDM  MDM Reviewed: previous chart, nursing note and vitals Interpretation: labs and ultrasound     Results for orders placed or performed during the hospital encounter of 05/25/15  Urinalysis, Routine w reflex microscopic (not at [facility])  Result Value Ref Range   Color, Urine YELLOW YELLOW   APPearance HAZY (A) CLEAR   Specific Gravity, Urine 1.020 1.005 - 1.030   pH 6.5 5.0 - 8.0   Glucose, UA NEGATIVE NEGATIVE mg/dL   Hgb urine dipstick LARGE (A) NEGATIVE   Bilirubin Urine NEGATIVE NEGATIVE   Ketones, ur NEGATIVE NEGATIVE mg/dL   Protein, ur NEGATIVE NEGATIVE mg/dL   Urobilinogen, UA 0.2 0.0 - 1.0 mg/dL   Nitrite NEGATIVE NEGATIVE   Leukocytes, UA SMALL (A) NEGATIVE  Urine microscopic-add on  Result Value Ref Range   Squamous Epithelial / LPF MANY (A) RARE   WBC, UA 7-10 <3 WBC/hpf   RBC / HPF 3-6 <3 RBC/hpf   Bacteria, UA MANY (A) RARE  CBC with Differential  Result Value Ref Range   WBC 7.8 4.0 - 10.5 K/uL   RBC 4.10 3.87 - 5.11 MIL/uL   Hemoglobin 10.4 (L) 12.0 - 15.0 g/dL   HCT 33.4 (L) 36.0 - 46.0 %   MCV 81.5 78.0 - 100.0 fL   MCH 25.4 (L) 26.0 - 34.0 pg   MCHC 31.1 30.0 - 36.0 g/dL   RDW 15.8 (H) 11.5 - 15.5 %   Platelets 249 150 - 400 K/uL   Neutrophils Relative % 56 43 - 77 %   Neutro Abs 4.4 1.7 - 7.7 K/uL   Lymphocytes Relative 34 12 - 46 %   Lymphs Abs 2.6 0.7 - 4.0 K/uL   Monocytes Relative 7 3 - 12 %   Monocytes  Absolute 0.5 0.1 - 1.0 K/uL   Eosinophils Relative 3 0 - 5 %   Eosinophils Absolute 0.2 0.0 - 0.7 K/uL   Basophils Relative 1 0 - 1 %   Basophils Absolute 0.1 0.0 - 0.1 K/uL   WBC Morphology ATYPICAL LYMPHOCYTES   I-Stat beta hCG blood, ED  Result Value Ref Range   I-stat hCG, quantitative <5.0 <5 mIU/mL   Comment 3          I-stat Chem 8, ED  Result Value Ref Range   Sodium 139 135 - 145 mmol/L   Potassium 3.6 3.5 - 5.1 mmol/L   Chloride 102 101 - 111 mmol/L   BUN 8 6 - 20 mg/dL   Creatinine, Ser 0.60 0.44 -  1.00 mg/dL   Glucose, Bld 80 65 - 99 mg/dL   Calcium, Ion 1.17 1.12 - 1.23 mmol/L   TCO2 22 0 - 100 mmol/L   Hemoglobin 11.9 (L) 12.0 - 15.0 g/dL   HCT 35.0 (L) 36.0 - 46.0 %   US Pelvis Complete 05/25/2015   CLINICAL DATA:  Status post abortion with persistent pelvic pain and bleeding.  EXAM: TRANSABDOMINAL ULTRASOUND OF PELVIS  TECHNIQUE: Transabdominal ultrasound examination of the pelvis was performed including evaluation of the uterus, ovaries, adnexal regions, and pelvic cul-de-sac.  COMPARISON:  04/02/2015  FINDINGS: Uterus  Measurements: 8.4 x 4.5 x 6.7 cm. No myometrial abnormalities.  Endometrium  Thickness: 9.8 mm. Heterogeneous endometrial debris with increased blood flow suspicious for retained products.  Right ovary  Not visualized.  Left ovary  Measurements: 3.8 x 1.9 x 2.8 cm. Normal appearance/no adnexal mass.  Other findings:  No free fluid  IMPRESSION: Heterogeneous endometrial debris with blood flow worrisome for retained products.  Non visualization of the right ovary.  The left ovary is normal.   Electronically Signed   By: Marijo Sanes M.D.   On: 05/25/2015 15:17    1615:   Blood type O positive, per EPIC chart review. T/C to OB/GYN Dr. Glo Herring, case discussed, including:  HPI, pertinent PM/SHx, VS/PE, dx testing, ED course and treatment:  Requests he will come to ED for evaluation, he requests to perform pelvic exam. Dx and testing, as well as d/w OB/GYN MD,  d/w pt.  Questions answered.  Verb understanding, agreeable to eval by OB/GYN MD in the ED.   1745:  OB/GYN MD has evaluated pt in the ED: he will d/c pt and f/u in ofc in 2 days as previously scheduled.     Francine Graven, DO 05/26/15 1343

## 2015-05-25 NOTE — ED Notes (Signed)
Dr. Thornton Papas here to assess patient, examined with myself as chaperone .

## 2015-05-25 NOTE — Discharge Instructions (Signed)
Misoprostol tablets What is this medicine? MISOPROSTOL (mye soe PROST ole) helps to prevent stomach ulcers in patients who take medicines like ibuprofen and aspirin and who are at high risk of complications from ulcers. The use by Dr Glo Herring is to help you with bleeding This medicine may be used for other purposes; ask your health care provider or pharmacist if you have questions. COMMON BRAND NAME(S): Cytotec  TAKE 4 TABLETS , ALONG WITH A PAIN MEDICATION, WITH DINNER.  What should I tell my health care provider before I take this medicine? They need to know if you have any of these conditions: -Crohn's disease -heart disease -kidney disease -ulcerative colitis -an unusual or allergic reaction to misoprostol, prostaglandins, other medicines, foods, dyes, or preservatives -pregnant or trying to get pregnant -breast-feeding How should I use this medicine? Take this medicine by mouth with a full glass of water. Follow the directions on the prescription label. Take this medicine with food.Take your medicine at regular intervals. Do not take your medicine more often than directed. Talk to your pediatrician regarding the use of this medicine in children. Special care may be needed. Overdosage: If you think you have taken too much of this medicine contact a poison control center or emergency room at once. NOTE: This medicine is only for you. Do not share this medicine with others. What if I miss a dose? If you miss a dose, take it as soon as you can. If it is almost time for your next dose, take only that dose. Do not take double or extra doses. What may interact with this medicine? -antacids This list may not describe all possible interactions. Give your health care provider a list of all the medicines, herbs, non-prescription drugs, or dietary supplements you use. Also tell them if you smoke, drink alcohol, or use illegal drugs. Some items may interact with your medicine. What should I watch  for while using this medicine? Do not smoke cigarettes or drink alcohol. These increase irritation to your stomach and can make it more susceptible to damage from medicine like ibuprofen and aspirin. If you are female, do not use this medicine if you are pregnant. Do not get pregnant while taking this medicine and for at least one month (one full menstrual cycle) after stopping this medicine. If you can become pregnant, use a reliable form of birth control while taking this medicine. Talk to your doctor about birth control options. If you do become pregnant, think you are pregnant, or want to become pregnant, immediately call your doctor for advice. What side effects may I notice from receiving this medicine? Side effects that you should report to your doctor or health care professional as soon as possible: -allergic reactions like skin rash, itching or hives, swelling of the face, lips, or tongue -chest pain -fainting spells -severe diarrhea -sudden shortness of breath -unusual vaginal bleeding, pelvic pain, or cramping Side effects that usually do not require medical attention (report to your doctor or health care professional if they continue or are bothersome): -dizziness -headache -menstrual irregularity, spotting, or cramps -mild diarrhea -nausea -stomach upset or cramps This list may not describe all possible side effects. Call your doctor for medical advice about side effects. You may report side effects to FDA at 1-800-FDA-1088. Where should I keep my medicine? Keep out of the reach of children. Store at room temperature below 25 degrees C (77 degrees F). Keep in a dry place. Protect from moisture. Throw away any unused medicine after the  expiration date. NOTE: This sheet is a summary. It may not cover all possible information. If you have questions about this medicine, talk to your doctor, pharmacist, or health care provider.  2015, Elsevier/Gold Standard. (2008-09-23 10:59:53)

## 2015-05-27 ENCOUNTER — Ambulatory Visit (INDEPENDENT_AMBULATORY_CARE_PROVIDER_SITE_OTHER): Payer: Managed Care, Other (non HMO) | Admitting: Advanced Practice Midwife

## 2015-05-27 ENCOUNTER — Encounter: Payer: Self-pay | Admitting: Advanced Practice Midwife

## 2015-05-27 VITALS — BP 100/60 | HR 80 | Ht 62.0 in | Wt 116.0 lb

## 2015-05-27 DIAGNOSIS — O039 Complete or unspecified spontaneous abortion without complication: Secondary | ICD-10-CM

## 2015-05-27 DIAGNOSIS — Z3492 Encounter for supervision of normal pregnancy, unspecified, second trimester: Secondary | ICD-10-CM

## 2015-05-27 DIAGNOSIS — Z9889 Other specified postprocedural states: Secondary | ICD-10-CM

## 2015-05-27 DIAGNOSIS — Z3202 Encounter for pregnancy test, result negative: Secondary | ICD-10-CM

## 2015-05-27 LAB — POCT URINE PREGNANCY: Preg Test, Ur: NEGATIVE

## 2015-05-27 NOTE — Progress Notes (Signed)
   [facility] Visit  Patient name: Alicia [name] MRN [id number]  Date of birth: 1991/01/25  CC & HPI:  Alicia [name] is a 24 y.o. African American female presenting today for F/U TAB. She had a TAB at 16/2 week on 7/6 (she told FOB she has a miscarriage).  Went to ED 2 days ago with persistant bleeding/cramping  US showed Possible retained POC, HCG <2.  Given cytotec PO 88mcg. Took the meds, and now is "hardlly bleeding or cramping".    Pertinent History Reviewed:  Medical & Surgical Hx:   Past Medical History  Diagnosis Date  . Urinary tract infection   . HSV-2 infection   . Heavy menstrual period 04/28/2014  . Vaginal itching 12/31/2014  . Vaginal discharge 12/31/2014  . Yeast infection 12/31/2014  . Contraceptive management 01/22/2015   Past Surgical History  Procedure Laterality Date  . No past surgeries     Family History  Problem Relation Age of Onset  . Hypertension Mother   . Anemia Mother   . Cancer Maternal Grandmother     breast  . Heart attack Paternal Grandfather     Current outpatient prescriptions:  .  HYDROcodone-acetaminophen (NORCO/VICODIN) 5-325 MG per tablet, Take 1 tablet by mouth every 6 (six) hours as needed for moderate pain or severe pain. (Patient not taking: Reported on 05/27/2015), Disp: 30 tablet, Rfl: 0 .  misoprostol (CYTOTEC) 200 MCG tablet, Take 4 tablets (800 mcg total) by mouth one time only at 6 PM. (Patient not taking: Reported on 05/27/2015), Disp: 4 tablet, Rfl: 1 .  nitrofurantoin, macrocrystal-monohydrate, (MACROBID) 100 MG capsule, Take 1 capsule (100 mg total) by mouth 2 (two) times daily. (Patient not taking: Reported on 05/25/2015), Disp: 10 capsule, Rfl: 0 .  Prenat-FeFum-FePo-FA-Omega 3 (CONCEPT DHA) 53.5-38-1 MG CAPS, Take 1 capsule by mouth daily. (Patient not taking: Reported on 05/25/2015), Disp: 30 capsule, Rfl: 11 Social History: Reviewed -  reports that she has quit smoking. Her smoking use included Cigarettes. She has a .04  pack-year smoking history. She has never used smokeless tobacco.  Review of Systems:   Constitutional: Negative for fever and chills Eyes: Negative for visual disturbances Respiratory: Negative for shortness of breath, dyspnea Cardiovascular: Negative for chest pain or palpitations  Gastrointestinal: Negative for vomiting, diarrhea and constipation; no abdominal pain Genitourinary: Negative for dysuria and urgency, vaginal irritation or itching Musculoskeletal: Negative for back pain, joint pain, myalgias  Neurological: Negative for dizziness and headaches    Objective Findings:  Vitals: BP 100/60 mmHg  Pulse 80  Ht 5\' 2"  (1.575 m)  Wt 116 lb (52.617 kg)  BMI 21.21 kg/m2  LMP 02/15/2015  Breastfeeding? Unknown  Physical Examination: General appearance - alert, well appearing, and in no distress Mental status - alert, oriented to person, place, and time Abdomen - soft, nontender, nondistended, no masses or organomegaly Pelvic - normal external genitalia, vulva, vagina, cervix, uterus and adnexa. Scant amount of blood  Results for orders placed or performed in visit on 05/27/15 (from the past 24 hour(s))  POCT urine pregnancy   Collection Time: 05/27/15 11:58 AM  Result Value Ref Range   Preg Test, Ur Negative Negative         Assessment & Plan:  A:   F/U TAB, noraml exam P:  Nexplanon 3 weeks. No sex until then   CRESENZO-DISHMAN,Shanteria Laye CNM 05/27/2015 2:43 PM

## 2015-05-27 NOTE — Assessment & Plan Note (Signed)
Had TAB at 16.2 weeks

## 2015-06-15 ENCOUNTER — Telehealth: Payer: Self-pay | Admitting: *Deleted

## 2015-06-15 ENCOUNTER — Other Ambulatory Visit: Payer: Self-pay | Admitting: Obstetrics & Gynecology

## 2015-06-15 DIAGNOSIS — N939 Abnormal uterine and vaginal bleeding, unspecified: Secondary | ICD-10-CM

## 2015-06-15 NOTE — Telephone Encounter (Signed)
Pt states she had an AB on 7/6 and has been bleeding heavy and passing clots every since then, she came to our office and saw Manus Gunning on 8/3 after having taken Cytotec and on that day the bleeding was really light but when she left here the bleeding got heavy again.  Manus Gunning told her she was about to start her period but she has still not stopped bleeding.  Pt states she has not been sexually active since the AB.  Please advise.

## 2015-06-15 NOTE — Telephone Encounter (Signed)
Pt informed of need for U/S and follow up with MD, call transferred to [facility] at the front desk for appointments to be scheduled.

## 2015-06-16 ENCOUNTER — Ambulatory Visit (INDEPENDENT_AMBULATORY_CARE_PROVIDER_SITE_OTHER): Payer: Managed Care, Other (non HMO) | Admitting: Obstetrics & Gynecology

## 2015-06-16 ENCOUNTER — Other Ambulatory Visit: Payer: Self-pay | Admitting: Obstetrics & Gynecology

## 2015-06-16 ENCOUNTER — Encounter: Payer: Self-pay | Admitting: Obstetrics & Gynecology

## 2015-06-16 ENCOUNTER — Ambulatory Visit (INDEPENDENT_AMBULATORY_CARE_PROVIDER_SITE_OTHER): Payer: Managed Care, Other (non HMO)

## 2015-06-16 VITALS — BP 100/70 | HR 72 | Wt 114.3 lb

## 2015-06-16 DIAGNOSIS — N939 Abnormal uterine and vaginal bleeding, unspecified: Secondary | ICD-10-CM | POA: Diagnosis not present

## 2015-06-16 DIAGNOSIS — O034 Incomplete spontaneous abortion without complication: Secondary | ICD-10-CM

## 2015-06-16 NOTE — Patient Instructions (Signed)
[place]  06/16/2015     @PREFPERIOPPHARMACY @   Your procedure is scheduled on  06/19/2015  Report to [facility] at  615  A.M.  Call this number if you have problems the morning of surgery:  [phone]   Remember:  Do not eat food or drink liquids after midnight.  Take these medicines the morning of surgery with A SIP OF WATER: none  Do not wear jewelry, make-up or nail polish.  Do not wear lotions, powders, or perfumes.   Do not shave 48 hours prior to surgery.  Men may shave face and neck.  Do not bring valuables to the hospital.  [facility] is not responsible for any belongings or valuables.  Contacts, dentures or bridgework may not be worn into surgery.  Leave your suitcase in the car.  After surgery it may be brought to your room.  For patients admitted to the hospital, discharge time will be determined by your treatment team.  Patients discharged the day of surgery will not be allowed to drive home.   Name and phone number of your driver:   family Special instructions:  none  Please read over the following fact sheets that you were given. Pain Booklet, Coughing and Deep Breathing, Surgical Site Infection Prevention, Anesthesia Post-op Instructions and Care and Recovery After Surgery      Dilation and Curettage or Vacuum Curettage Dilation and curettage (D&C) and vacuum curettage are minor procedures. A D&C involves stretching (dilation) the cervix and scraping (curettage) the inside lining of the womb (uterus). During a D&C, tissue is gently scraped from the inside lining of the uterus. During a vacuum curettage, the lining and tissue in the uterus are removed with the use of gentle suction.  Curettage may be performed to either diagnose or treat a problem. As a diagnostic procedure, curettage is performed to examine tissues from the uterus. A diagnostic curettage may be performed for the following symptoms:   Irregular bleeding in the uterus.    Bleeding with the development of clots.   Spotting between menstrual periods.   Prolonged menstrual periods.   Bleeding after menopause.   No menstrual period (amenorrhea).   A change in size and shape of the uterus.  As a treatment procedure, curettage may be performed for the following reasons:   Removal of an IUD (intrauterine device).   Removal of retained placenta after giving birth. Retained placenta can cause an infection or bleeding severe enough to require transfusions.   Abortion.   Miscarriage.   Removal of polyps inside the uterus.   Removal of uncommon types of noncancerous lumps (fibroids).  LET [facility] CARE PROVIDER KNOW ABOUT:   Any allergies you have.   All medicines you are taking, including vitamins, herbs, eye drops, creams, and over-the-counter medicines.   Previous problems you or members of your family have had with the use of anesthetics.   Any blood disorders you have.   Previous surgeries you have had.   Medical conditions you have. RISKS AND COMPLICATIONS  Generally, this is a safe procedure. However, as with any procedure, complications can occur. Possible complications include:  Excessive bleeding.   Infection of the uterus.   Damage to the cervix.   Development of scar tissue (adhesions) inside the uterus, later causing abnormal amounts of menstrual bleeding.   Complications from the general anesthetic, if a general anesthetic is used.   Putting a hole (perforation) in the uterus. This is  rare.  BEFORE THE PROCEDURE   Eat and drink before the procedure only as directed by your health care provider.   Arrange for someone to take you home.  PROCEDURE  This procedure usually takes about 15-30 minutes.  You will be given one of the following:  A medicine that numbs the area in and around the cervix (local anesthetic).   A medicine to make you sleep through the procedure (general  anesthetic).  You will lie on your back with your legs in stirrups.   A warm metal or plastic instrument (speculum) will be placed in your vagina to keep it open and to allow the health care provider to see the cervix.  There are two ways in which your cervix can be softened and dilated. These include:   Taking a medicine.   Having thin rods (laminaria) inserted into your cervix.   A curved tool (curette) will be used to scrape cells from the inside lining of the uterus. In some cases, gentle suction is applied with the curette. The curette will then be removed.  AFTER THE PROCEDURE   You will rest in the recovery area until you are stable and are ready to go home.   You may feel sick to your stomach (nauseous) or throw up (vomit) if you were given a general anesthetic.   You may have a sore throat if a tube was placed in your throat during general anesthesia.   You may have light cramping and bleeding. This may last for 2 days to 2 weeks after the procedure.   Your uterus needs to make a new lining after the procedure. This may make your next period late. Document Released: 10/10/2005 Document Revised: 06/12/2013 Document Reviewed: 05/09/2013 [facility] Patient Information 2015 [place], [place]. This information is not intended to replace advice given to you by your health care provider. Make sure you discuss any questions you have with your health care provider. PATIENT INSTRUCTIONS POST-ANESTHESIA  IMMEDIATELY FOLLOWING SURGERY:  Do not drive or operate machinery for the first twenty four hours after surgery.  Do not make any important decisions for twenty four hours after surgery or while taking narcotic pain medications or sedatives.  If you develop intractable nausea and vomiting or a severe headache please notify your doctor immediately.  FOLLOW-UP:  Please make an appointment with your surgeon as instructed. You do not need to follow up with anesthesia unless specifically  instructed to do so.  WOUND CARE INSTRUCTIONS (if applicable):  Keep a dry clean dressing on the anesthesia/puncture wound site if there is drainage.  Once the wound has quit draining you may leave it open to air.  Generally you should leave the bandage intact for twenty four hours unless there is drainage.  If the epidural site drains for more than 36-48 hours please call the anesthesia department.  QUESTIONS?:  Please feel free to call your physician or the hospital operator if you have any questions, and they will be happy to assist you.

## 2015-06-16 NOTE — Progress Notes (Signed)
Patient ID: Alicia [name], female   DOB: [birth year], 24 y.o.   MRN: [id number]   Preoperative History and Physical  Alicia [name] is a 24 y.o. [id number] with No LMP recorded. Patient has had an implant. admitted for a suction curettage for retained POC after pregancny termination on 7/6.  Has continued to bleed heavu=ily and cramp. Has received cytotec x 2 and sonogram today reveals retained tissue    PMH:    Past Medical History  Diagnosis Date  . Urinary tract infection   . HSV-2 infection   . Heavy menstrual period 04/28/2014  . Vaginal itching 12/31/2014  . Vaginal discharge 12/31/2014  . Yeast infection 12/31/2014  . Contraceptive management 01/22/2015    PSH:     Past Surgical History  Procedure Laterality Date  . No past surgeries      POb/GynH:      OB History    Gravida Para Term Preterm AB TAB SAB Ectopic Multiple Living   2 1 1  1 1    1       Obstetric Comments   TAB 16 weeks      SH:   Social History  Substance Use Topics  . Smoking status: Former Smoker -- 0.02 packs/day for 2 years    Types: Cigarettes  . Smokeless tobacco: Never Used  . Alcohol Use: No    FH:    Family History  Problem Relation Age of Onset  . Hypertension Mother   . Anemia Mother   . Cancer Maternal Grandmother     breast  . Heart attack Paternal Grandfather      Allergies:  Allergies  Allergen Reactions  . Penicillins Hives  . Sulfa Antibiotics Hives and Swelling    Medications:       Current outpatient prescriptions:  .  HYDROcodone-acetaminophen (NORCO/VICODIN) 5-325 MG per tablet, Take 1 tablet by mouth every 6 (six) hours as needed for moderate pain or severe pain. (Patient not taking: Reported on 05/27/2015), Disp: 30 tablet, Rfl: 0 .  misoprostol (CYTOTEC) 200 MCG tablet, Take 4 tablets (800 mcg total) by mouth one time only at 6 PM. (Patient not taking: Reported on 05/27/2015), Disp: 4 tablet, Rfl: 1 .  nitrofurantoin, macrocrystal-monohydrate, (MACROBID) 100 MG capsule,  Take 1 capsule (100 mg total) by mouth 2 (two) times daily. (Patient not taking: Reported on 05/25/2015), Disp: 10 capsule, Rfl: 0 .  Prenat-FeFum-FePo-FA-Omega 3 (CONCEPT DHA) 53.5-38-1 MG CAPS, Take 1 capsule by mouth daily. (Patient not taking: Reported on 05/25/2015), Disp: 30 capsule, Rfl: 11  Review of Systems:   Review of Systems  Constitutional: Negative for fever, chills, weight loss, malaise/fatigue and diaphoresis.  HENT: Negative for hearing loss, ear pain, nosebleeds, congestion, sore throat, neck pain, tinnitus and ear discharge.   Eyes: Negative for blurred vision, double vision, photophobia, pain, discharge and redness.  Respiratory: Negative for cough, hemoptysis, sputum production, shortness of breath, wheezing and stridor.   Cardiovascular: Negative for chest pain, palpitations, orthopnea, claudication, leg swelling and PND.  Gastrointestinal: Positive for abdominal pain. Negative for heartburn, nausea, vomiting, diarrhea, constipation, blood in stool and melena.  Genitourinary: Negative for dysuria, urgency, frequency, hematuria and flank pain.  Musculoskeletal: Negative for myalgias, back pain, joint pain and falls.  Skin: Negative for itching and rash.  Neurological: Negative for dizziness, tingling, tremors, sensory change, speech change, focal weakness, seizures, loss of consciousness, weakness and headaches.  Endo/Heme/Allergies: Negative for environmental allergies and polydipsia. Does not bruise/bleed easily.  Psychiatric/Behavioral: Negative for  depression, suicidal ideas, hallucinations, memory loss and substance abuse. The patient is not nervous/anxious and does not have insomnia.      PHYSICAL EXAM:  Blood pressure 100/70, pulse 72, weight 114 lb 4.8 oz (51.846 kg), unknown if currently breastfeeding.    Vitals reviewed. Constitutional: She is oriented to person, place, and time. She appears well-developed and well-nourished.  HENT:  Head: Normocephalic and  atraumatic.  Right Ear: External ear normal.  Left Ear: External ear normal.  Nose: Nose normal.  Mouth/Throat: Oropharynx is clear and moist.  Eyes: Conjunctivae and EOM are normal. Pupils are equal, round, and reactive to light. Right eye exhibits no discharge. Left eye exhibits no discharge. No scleral icterus.  Neck: Normal range of motion. Neck supple. No tracheal deviation present. No thyromegaly present.  Cardiovascular: Normal rate, regular rhythm, normal heart sounds and intact distal pulses.  Exam reveals no gallop and no friction rub.   No murmur heard. Respiratory: Effort normal and breath sounds normal. No respiratory distress. She has no wheezes. She has no rales. She exhibits no tenderness.  GI: Soft. Bowel sounds are normal. She exhibits no distension and no mass. There is tenderness. There is no rebound and no guarding.  Genitourinary:       Vulva is normal without lesions Vagina is pink moist without discharge Cervix normal in appearance and pap is normal Uterus is normal size, contour, position, consistency, mobility, non-tender Adnexa is negative with normal sized ovaries by sonogram  Musculoskeletal: Normal range of motion. She exhibits no edema and no tenderness.  Neurological: She is alert and oriented to person, place, and time. She has normal reflexes. She displays normal reflexes. No cranial nerve deficit. She exhibits normal muscle tone. Coordination normal.  Skin: Skin is warm and dry. No rash noted. No erythema. No pallor.  Psychiatric: She has a normal mood and affect. Her behavior is normal. Judgment and thought content normal.    Labs: No results found for this or any previous visit (from the past 336 hour(s)).  EKG: Orders placed or performed during the hospital encounter of 04/09/15  . EKG 12-Lead  . EKG 12-Lead  . EKG 12-Lead  . EKG 12-Lead  . EKG    Imaging Studies: US Ob Limited  06/16/2015   GYNECOLOGIC SONOGRAM   Alicia [name] is a 24  y.o. G2P1011 LMP unknown for a pelvic sonogram  for vag bleeding, s/p surgical abortion 04/27/2015,  Uterus                      7.04 x 4.1 x 6.2 cm, normal anteverted uterus  Endometrium          14 mm, asymmetrical, complex thick eec w/color flow  Right ovary             3.1 x 4.1 x 1.8 cm, wnl  Left ovary                3.8 x 2.9 x 1.8 cm, wnl    Technician Comments: US PELVIS T/A T/V: s/p surgical abortion ,normal anteverted uterus,complex  thick EEC 36mm w/color flow,normal ov's bilat,small amount of simple cul  de sac fluid      [employer] 06/16/2015 10:21 AM  Clinical Impression and recommendations:  I have reviewed the sonogram results above, combined with the patient's  current clinical course, below are my impressions and any appropriate  recommendations for management based on the sonographic findings.  Pt had TAB and  has continued to bleed so sonogram ordered reveals;  Probable retained decidual cast or POC Otherwise normal anatomy  S/P cytotec  Couple of times without passing will need uterine curettage  to remove   Alicia [name] 06/16/2015 10:33 AM    US Ob Transvaginal  06/16/2015   GYNECOLOGIC SONOGRAM   Alicia [name] is a 24 y.o. G2P1011 LMP unknown for a pelvic sonogram  for vag bleeding, s/p surgical abortion 04/27/2015,  Uterus                      7.04 x 4.1 x 6.2 cm, normal anteverted uterus  Endometrium          14 mm, asymmetrical, complex thick eec w/color flow  Right ovary             3.1 x 4.1 x 1.8 cm, wnl  Left ovary                3.8 x 2.9 x 1.8 cm, wnl    Technician Comments: US PELVIS T/A T/V: s/p surgical abortion ,normal anteverted uterus,complex  thick EEC 29mm w/color flow,normal ov's bilat,small amount of simple cul  de sac fluid      [employer] 06/16/2015 10:21 AM  Clinical Impression and recommendations:  I have reviewed the sonogram results above, combined with the patient's  current clinical course, below are my impressions and any appropriate  recommendations for  management based on the sonographic findings.  Pt had TAB and has continued to bleed so sonogram ordered reveals;  Probable retained decidual cast or POC Otherwise normal anatomy  S/P cytotec  Couple of times without passing will need uterine curettage  to remove   Alicia [name] 06/16/2015 10:33 AM    US Pelvis Complete  05/25/2015   CLINICAL DATA:  Status post abortion with persistent pelvic pain and bleeding.  EXAM: TRANSABDOMINAL ULTRASOUND OF PELVIS  TECHNIQUE: Transabdominal ultrasound examination of the pelvis was performed including evaluation of the uterus, ovaries, adnexal regions, and pelvic cul-de-sac.  COMPARISON:  04/02/2015  FINDINGS: Uterus  Measurements: 8.4 x 4.5 x 6.7 cm. No myometrial abnormalities.  Endometrium  Thickness: 9.8 mm. Heterogeneous endometrial debris with increased blood flow suspicious for retained products.  Right ovary  Not visualized.  Left ovary  Measurements: 3.8 x 1.9 x 2.8 cm. Normal appearance/no adnexal mass.  Other findings:  No free fluid  IMPRESSION: Heterogeneous endometrial debris with blood flow worrisome for retained products.  Non visualization of the right ovary.  The left ovary is normal.   Electronically Signed   By: Marijo Sanes M.D.   On: 05/25/2015 15:17      Assessment: Retained POC Patient Active Problem List   Diagnosis Date Noted  . Vaginal itching 12/31/2014  . Vaginal discharge 12/31/2014  . Yeast infection 12/31/2014  . BV (bacterial vaginosis) 12/01/2014  . HSV-2 seropositive 12/30/2013    Plan: Suction curettage for retained products of conception vs retained decidual cast s/p pregnancy termination 04/29/2015  Alicia [name] 06/16/2015 10:40 AM        US Ob Limited  06/16/2015   GYNECOLOGIC SONOGRAM   Alicia [name] is a 24 y.o. G2P1011 LMP unknown for a pelvic sonogram  for vag bleeding, s/p surgical abortion 04/27/2015,  Uterus                      7.04 x 4.1 x 6.2 cm, normal anteverted uterus  Endometrium  14  mm, asymmetrical, complex thick eec w/color flow  Right ovary             3.1 x 4.1 x 1.8 cm, wnl  Left ovary                3.8 x 2.9 x 1.8 cm, wnl    Technician Comments: US PELVIS T/A T/V: s/p surgical abortion ,normal anteverted uterus,complex  thick EEC 62mm w/color flow,normal ov's bilat,small amount of simple cul  de sac fluid      [employer] 06/16/2015 10:21 AM  Clinical Impression and recommendations:  I have reviewed the sonogram results above, combined with the patient's  current clinical course, below are my impressions and any appropriate  recommendations for management based on the sonographic findings.  Pt had TAB and has continued to bleed so sonogram ordered reveals;  Probable retained decidual cast or POC Otherwise normal anatomy  S/P cytotec  Couple of times without passing will need uterine curettage  to remove   Alicia [name] 06/16/2015 10:33 AM    US Ob Transvaginal  06/16/2015   GYNECOLOGIC SONOGRAM   Alicia [name] is a 24 y.o. G2P1011 LMP unknown for a pelvic sonogram  for vag bleeding, s/p surgical abortion 04/27/2015,  Uterus                      7.04 x 4.1 x 6.2 cm, normal anteverted uterus  Endometrium          14 mm, asymmetrical, complex thick eec w/color flow  Right ovary             3.1 x 4.1 x 1.8 cm, wnl  Left ovary                3.8 x 2.9 x 1.8 cm, wnl    Technician Comments: US PELVIS T/A T/V: s/p surgical abortion ,normal anteverted uterus,complex  thick EEC 60mm w/color flow,normal ov's bilat,small amount of simple cul  de sac fluid      [employer] 06/16/2015 10:21 AM  Clinical Impression and recommendations:  I have reviewed the sonogram results above, combined with the patient's  current clinical course, below are my impressions and any appropriate  recommendations for management based on the sonographic findings.  Pt had TAB and has continued to bleed so sonogram ordered reveals;  Probable retained decidual cast or POC Otherwise normal anatomy  S/P cytotec  Couple of  times without passing will need uterine curettage  to remove   Alicia [name] 06/16/2015 10:33 AM

## 2015-06-16 NOTE — Progress Notes (Signed)
US PELVIS T/A T/V: s/p surgical abortion 04/29/2015,normal anteverted uterus,complex thick EEC 3mm w/color flow,normal ov's bilat,small amount of simple cul de sac fluid

## 2015-06-17 ENCOUNTER — Encounter (HOSPITAL_COMMUNITY)
Admission: RE | Admit: 2015-06-17 | Discharge: 2015-06-17 | Disposition: A | Discharge status: Home / Self Care | Payer: Medicaid Other | Source: Ambulatory Visit | Attending: Obstetrics & Gynecology | Admitting: Obstetrics & Gynecology

## 2015-06-17 ENCOUNTER — Encounter (HOSPITAL_COMMUNITY): Payer: Self-pay

## 2015-06-17 ENCOUNTER — Encounter: Payer: Managed Care, Other (non HMO) | Admitting: Advanced Practice Midwife

## 2015-06-17 DIAGNOSIS — O034 Incomplete spontaneous abortion without complication: Secondary | ICD-10-CM | POA: Diagnosis present

## 2015-06-17 DIAGNOSIS — F17200 Nicotine dependence, unspecified, uncomplicated: Secondary | ICD-10-CM | POA: Diagnosis not present

## 2015-06-17 HISTORY — DX: Anemia, unspecified: D64.9

## 2015-06-17 LAB — COMPREHENSIVE METABOLIC PANEL
ALBUMIN: 4.2 g/dL (ref 3.5–5.0)
ALT: 12 U/L — ABNORMAL LOW (ref 14–54)
ANION GAP: 6 (ref 5–15)
AST: 13 U/L — AB (ref 15–41)
Alkaline Phosphatase: 62 U/L (ref 38–126)
BILIRUBIN TOTAL: 0.4 mg/dL (ref 0.3–1.2)
BUN: 14 mg/dL (ref 6–20)
CHLORIDE: 106 mmol/L (ref 101–111)
CO2: 27 mmol/L (ref 22–32)
Calcium: 9.3 mg/dL (ref 8.9–10.3)
Creatinine, Ser: 0.63 mg/dL (ref 0.44–1.00)
GFR calc Af Amer: >60 mL/min (ref >60)
GLUCOSE: 94 mg/dL (ref 65–99)
POTASSIUM: 4.2 mmol/L (ref 3.5–5.1)
Sodium: 139 mmol/L (ref 135–145)
TOTAL PROTEIN: 7.5 g/dL (ref 6.5–8.1)

## 2015-06-17 LAB — URINALYSIS, ROUTINE W REFLEX MICROSCOPIC
Bilirubin Urine: NEGATIVE
GLUCOSE, UA: NEGATIVE mg/dL
HGB URINE DIPSTICK: NEGATIVE
KETONES UR: NEGATIVE mg/dL
Leukocytes, UA: NEGATIVE
Nitrite: NEGATIVE
PROTEIN: NEGATIVE mg/dL
Specific Gravity, Urine: 1.025 (ref 1.005–1.030)
UROBILINOGEN UA: 0.2 mg/dL (ref 0.0–1.0)
pH: 6 (ref 5.0–8.0)

## 2015-06-17 LAB — CBC
HCT: 33.2 % — ABNORMAL LOW (ref 36.0–46.0)
Hemoglobin: 10.4 g/dL — ABNORMAL LOW (ref 12.0–15.0)
MCH: 25.5 pg — ABNORMAL LOW (ref 26.0–34.0)
MCHC: 31.3 g/dL (ref 30.0–36.0)
MCV: 81.4 fL (ref 78.0–100.0)
PLATELETS: 298 10*3/uL (ref 150–400)
RBC: 4.08 MIL/uL (ref 3.87–5.11)
RDW: 15.4 % (ref 11.5–15.5)
WBC: 6.7 10*3/uL (ref 4.0–10.5)

## 2015-06-17 NOTE — Pre-Procedure Instructions (Signed)
Patient given information to sign up for my chart at home. 

## 2015-06-19 ENCOUNTER — Ambulatory Visit (HOSPITAL_COMMUNITY): Payer: Medicaid Other | Admitting: Anesthesiology

## 2015-06-19 ENCOUNTER — Ambulatory Visit (HOSPITAL_COMMUNITY)
Admission: RE | Admit: 2015-06-19 | Discharge: 2015-06-19 | Disposition: A | Discharge status: Home / Self Care | Payer: Medicaid Other | Source: Ambulatory Visit | Attending: Obstetrics & Gynecology | Admitting: Obstetrics & Gynecology

## 2015-06-19 ENCOUNTER — Encounter (HOSPITAL_COMMUNITY)
Admission: RE | Disposition: A | Discharge status: Home / Self Care | Payer: Self-pay | Source: Ambulatory Visit | Attending: Obstetrics & Gynecology

## 2015-06-19 ENCOUNTER — Encounter (HOSPITAL_COMMUNITY): Payer: Self-pay | Admitting: *Deleted

## 2015-06-19 DIAGNOSIS — O031 Delayed or excessive hemorrhage following incomplete spontaneous abortion: Secondary | ICD-10-CM | POA: Diagnosis not present

## 2015-06-19 DIAGNOSIS — O034 Incomplete spontaneous abortion without complication: Secondary | ICD-10-CM | POA: Diagnosis not present

## 2015-06-19 DIAGNOSIS — F17200 Nicotine dependence, unspecified, uncomplicated: Secondary | ICD-10-CM | POA: Diagnosis not present

## 2015-06-19 HISTORY — PX: DILATION AND CURETTAGE OF UTERUS: SHX78

## 2015-06-19 SURGERY — DILATION AND CURETTAGE
Anesthesia: General

## 2015-06-19 MED ORDER — FENTANYL CITRATE (PF) 100 MCG/2ML IJ SOLN
25.0000 ug | INTRAMUSCULAR | Status: AC
  Administered 2015-06-19 (×2): 25 ug via INTRAVENOUS

## 2015-06-19 MED ORDER — MIDAZOLAM HCL 2 MG/2ML IJ SOLN
INTRAMUSCULAR | Status: AC
  Filled 2015-06-19: qty 2

## 2015-06-19 MED ORDER — PROPOFOL 10 MG/ML IV BOLUS
INTRAVENOUS | Status: DC | PRN
  Administered 2015-06-19: 150 mg via INTRAVENOUS

## 2015-06-19 MED ORDER — METHYLERGONOVINE MALEATE 0.2 MG/ML IJ SOLN
INTRAMUSCULAR | Status: DC | PRN
  Administered 2015-06-19: 0.2 mg via INTRAMUSCULAR

## 2015-06-19 MED ORDER — ONDANSETRON HCL 4 MG/2ML IJ SOLN
INTRAMUSCULAR | Status: AC
  Filled 2015-06-19: qty 2

## 2015-06-19 MED ORDER — FENTANYL CITRATE (PF) 100 MCG/2ML IJ SOLN
INTRAMUSCULAR | Status: AC
  Filled 2015-06-19: qty 2

## 2015-06-19 MED ORDER — 0.9 % SODIUM CHLORIDE (POUR BTL) OPTIME
TOPICAL | Status: DC | PRN
  Administered 2015-06-19: 1000 mL

## 2015-06-19 MED ORDER — LIDOCAINE HCL (CARDIAC) 10 MG/ML IV SOLN
INTRAVENOUS | Status: DC | PRN
  Administered 2015-06-19: 30 mg via INTRAVENOUS

## 2015-06-19 MED ORDER — ONDANSETRON HCL 4 MG/2ML IJ SOLN: 4.0000 mg | Freq: Once | INTRAMUSCULAR | Status: DC | PRN

## 2015-06-19 MED ORDER — ONDANSETRON HCL 4 MG/2ML IJ SOLN
4.0000 mg | Freq: Once | INTRAMUSCULAR | Status: AC
  Administered 2015-06-19: 4 mg via INTRAVENOUS

## 2015-06-19 MED ORDER — MIDAZOLAM HCL 2 MG/2ML IJ SOLN
1.0000 mg | INTRAMUSCULAR | Status: DC | PRN
  Administered 2015-06-19: 2 mg via INTRAVENOUS

## 2015-06-19 MED ORDER — METHYLERGONOVINE MALEATE 0.2 MG/ML IJ SOLN
INTRAMUSCULAR | Status: AC
  Filled 2015-06-19: qty 1

## 2015-06-19 MED ORDER — LACTATED RINGERS IV SOLN
INTRAVENOUS | Status: DC
  Administered 2015-06-19: 1000 mL via INTRAVENOUS

## 2015-06-19 MED ORDER — FENTANYL CITRATE (PF) 100 MCG/2ML IJ SOLN
INTRAMUSCULAR | Status: DC | PRN
  Administered 2015-06-19: 50 ug via INTRAVENOUS

## 2015-06-19 MED ORDER — FENTANYL CITRATE (PF) 100 MCG/2ML IJ SOLN
INTRAMUSCULAR | Status: AC
  Filled 2015-06-19: qty 4

## 2015-06-19 MED ORDER — CIPROFLOXACIN HCL 500 MG PO TABS: 500.0000 mg | ORAL_TABLET | Freq: Two times a day (BID) | ORAL | Status: DC

## 2015-06-19 MED ORDER — KETOROLAC TROMETHAMINE 30 MG/ML IJ SOLN
INTRAMUSCULAR | Status: AC
  Filled 2015-06-19: qty 1

## 2015-06-19 MED ORDER — CEFAZOLIN SODIUM-DEXTROSE 2-3 GM-% IV SOLR
2.0000 g | INTRAVENOUS | Status: AC
  Administered 2015-06-19: 2 g via INTRAVENOUS

## 2015-06-19 MED ORDER — KETOROLAC TROMETHAMINE 10 MG PO TABS: 10.0000 mg | ORAL_TABLET | Freq: Three times a day (TID) | ORAL | Status: DC | PRN

## 2015-06-19 MED ORDER — KETOROLAC TROMETHAMINE 30 MG/ML IJ SOLN
30.0000 mg | Freq: Once | INTRAMUSCULAR | Status: AC
  Administered 2015-06-19: 30 mg via INTRAVENOUS

## 2015-06-19 MED ORDER — FENTANYL CITRATE (PF) 100 MCG/2ML IJ SOLN: 25.0000 ug | INTRAMUSCULAR | Status: DC | PRN

## 2015-06-19 MED ORDER — CEFAZOLIN SODIUM-DEXTROSE 2-3 GM-% IV SOLR
INTRAVENOUS | Status: AC
  Filled 2015-06-19: qty 50

## 2015-06-19 MED ORDER — LIDOCAINE HCL (PF) 1 % IJ SOLN
INTRAMUSCULAR | Status: AC
  Filled 2015-06-19: qty 5

## 2015-06-19 MED ORDER — PROPOFOL 10 MG/ML IV BOLUS
INTRAVENOUS | Status: AC
  Filled 2015-06-19: qty 20

## 2015-06-19 SURGICAL SUPPLY — 32 items
BAG HAMPER (MISCELLANEOUS) ×3 IMPLANT
CLOTH BEACON ORANGE TIMEOUT ST (SAFETY) ×3 IMPLANT
COVER LIGHT HANDLE STERIS (MISCELLANEOUS) ×6 IMPLANT
CURETTE VACUUM 9MM CVD CLR (CANNULA) ×3 IMPLANT
DECANTER SPIKE VIAL GLASS SM (MISCELLANEOUS) IMPLANT
FORMALIN 10 PREFIL 480ML (MISCELLANEOUS) ×3 IMPLANT
GAUZE SPONGE 4X4 16PLY XRAY LF (GAUZE/BANDAGES/DRESSINGS) ×3 IMPLANT
GLOVE BIO SURGEON STRL SZ7.5 (GLOVE) ×2 IMPLANT
GLOVE BIOGEL PI IND STRL 7.5 (GLOVE) IMPLANT
GLOVE BIOGEL PI IND STRL 8 (GLOVE) ×1 IMPLANT
GLOVE BIOGEL PI INDICATOR 7.5 (GLOVE) ×2
GLOVE BIOGEL PI INDICATOR 8 (GLOVE) ×2
GLOVE ECLIPSE 8.0 STRL XLNG CF (GLOVE) ×3 IMPLANT
GOWN STRL REUS W/TWL LRG LVL3 (GOWN DISPOSABLE) ×6 IMPLANT
GOWN STRL REUS W/TWL XL LVL3 (GOWN DISPOSABLE) ×3 IMPLANT
KIT BERKELEY 1ST TRIMESTER 3/8 (MISCELLANEOUS) ×3 IMPLANT
KIT ROOM TURNOVER AP CYSTO (KITS) ×3 IMPLANT
MANIFOLD NEPTUNE II (INSTRUMENTS) ×3 IMPLANT
MARKER SKIN DUAL TIP RULER LAB (MISCELLANEOUS) ×3 IMPLANT
NS IRRIG 1000ML POUR BTL (IV SOLUTION) ×3 IMPLANT
PACK BASIC III (CUSTOM PROCEDURE TRAY) ×3
PACK SRG BSC III STRL LF ECLPS (CUSTOM PROCEDURE TRAY) ×1 IMPLANT
PAD ARMBOARD 7.5X6 YLW CONV (MISCELLANEOUS) ×3 IMPLANT
SET BASIN LINEN APH (SET/KITS/TRAYS/PACK) ×3 IMPLANT
SET BERKELEY SUCTION TUBING (SUCTIONS) ×3 IMPLANT
SHEET LAVH (DRAPES) ×3 IMPLANT
SYRINGE 10CC LL (SYRINGE) IMPLANT
TOWEL OR 17X26 4PK STRL BLUE (TOWEL DISPOSABLE) IMPLANT
VACURETTE 10MM (CANNULA) IMPLANT
VACURETTE 12MM (CANNULA) IMPLANT
VACURETTE 14MM (CANNULA) IMPLANT
VACURETTE 8MM (CANNULA) IMPLANT

## 2015-06-19 NOTE — Discharge Instructions (Signed)
Dilation and Curettage or Vacuum Curettage, Care After  Refer to this sheet in the next few weeks. These instructions provide you with information on caring for yourself after your procedure. Your health care provider may also give you more specific instructions. Your treatment has been planned according to current medical practices, but problems sometimes occur. Call your health care provider if you have any problems or questions after your procedure.  WHAT TO EXPECT AFTER THE PROCEDURE  After your procedure, it is typical to have light cramping and bleeding. This may last for 2 days to 2 weeks after the procedure.  HOME CARE INSTRUCTIONS   · Do not drive for 24 hours.  · Wait 1 week before returning to strenuous activities.  · Take your temperature 2 times a day for 4 days and write it down. Provide these temperatures to your health care provider if you develop a fever.  · Avoid long periods of standing.  · Avoid heavy lifting, pushing, or pulling. Do not lift anything heavier than 10 pounds (4.5 kg).  · Limit stair climbing to once or twice a day.  · Take rest periods often.  · You may resume your usual diet.  · Drink enough fluids to keep your urine clear or pale yellow.  · Your usual bowel function should return. If you have constipation, you may:  ¨ Take a mild laxative with permission from your health care provider.  ¨ Add fruit and bran to your diet.  ¨ Drink more fluids.  · Take showers instead of baths until your health care provider gives you permission to take baths.  · Do not go swimming or use a hot tub until your health care provider approves.  · Try to have someone with you or available to you the first 24-48 hours, especially if you were given a general anesthetic.  · Do not douche, use tampons, or have sex (intercourse) for 2 weeks after the procedure.  · Only take over-the-counter or prescription medicines as directed by your health care provider. Do not take aspirin. It can cause  bleeding.  · Follow up with your health care provider as directed.  SEEK MEDICAL CARE IF:   · You have increasing cramps or pain that is not relieved with medicine.  · You have abdominal pain that does not seem to be related to the same area of earlier cramping and pain.  · You have bad smelling vaginal discharge.  · You have a rash.  · You are having problems with any medicine.  SEEK IMMEDIATE MEDICAL CARE IF:   · You have bleeding that is heavier than a normal menstrual period.  · You have a fever.  · You have chest pain.  · You have shortness of breath.  · You feel dizzy or feel like fainting.  · You pass out.  · You have pain in your shoulder strap area.  · You have heavy vaginal bleeding with or without blood clots.  MAKE SURE YOU:   · Understand these instructions.  · Will watch your condition.  · Will get help right away if you are not doing well or get worse.  Document Released: 10/07/2000 Document Revised: 10/15/2013 Document Reviewed: 05/09/2013  [employer]® Patient Information ©2015 [employer]. This information is not intended to replace advice given to you by your health care provider. Make sure you discuss any questions you have with your health care provider.

## 2015-06-19 NOTE — H&P (Signed)
Preoperative History and Physical  Alicia [name] is a 24 y.o. G2P1011 with No LMP recorded. Patient has had an implant. admitted for a suction curettage for retained POC after pregancny termination on 7/6. Has continued to bleed heavu=ily and cramp. Has received cytotec x 2 and sonogram today reveals retained tissue    PMH:  Past Medical History  Diagnosis Date  . Urinary tract infection   . HSV-2 infection   . Heavy menstrual period 04/28/2014  . Vaginal itching 12/31/2014  . Vaginal discharge 12/31/2014  . Yeast infection 12/31/2014  . Contraceptive management 01/22/2015    PSH:  Past Surgical History  Procedure Laterality Date  . No past surgeries      POb/GynH:  OB History    Gravida Para Term Preterm AB TAB SAB Ectopic Multiple Living   2 1 1  1 1    1       Obstetric Comments   TAB 16 weeks      SH:  Social History  Substance Use Topics  . Smoking status: Former Smoker -- 0.02 packs/day for 2 years    Types: Cigarettes  . Smokeless tobacco: Never Used  . Alcohol Use: No    FH:  Family History  Problem Relation Age of Onset  . Hypertension Mother   . Anemia Mother   . Cancer Maternal Grandmother     breast  . Heart attack Paternal Grandfather      Allergies:  Allergies  Allergen Reactions  . Penicillins Hives  . Sulfa Antibiotics Hives and Swelling    Medications:  Current outpatient prescriptions:  . HYDROcodone-acetaminophen (NORCO/VICODIN) 5-325 MG per tablet, Take 1 tablet by mouth every 6 (six) hours as needed for moderate pain or severe pain. (Patient not taking: Reported on 05/27/2015), Disp: 30 tablet, Rfl: 0 . misoprostol (CYTOTEC) 200 MCG tablet, Take 4 tablets (800 mcg total) by mouth one time only at 6 PM. (Patient not taking: Reported on 05/27/2015), Disp: 4 tablet, Rfl: 1 . nitrofurantoin,  macrocrystal-monohydrate, (MACROBID) 100 MG capsule, Take 1 capsule (100 mg total) by mouth 2 (two) times daily. (Patient not taking: Reported on 05/25/2015), Disp: 10 capsule, Rfl: 0 . Prenat-FeFum-FePo-FA-Omega 3 (CONCEPT DHA) 53.5-38-1 MG CAPS, Take 1 capsule by mouth daily. (Patient not taking: Reported on 05/25/2015), Disp: 30 capsule, Rfl: 11  Review of Systems:   Review of Systems  Constitutional: Negative for fever, chills, weight loss, malaise/fatigue and diaphoresis.  HENT: Negative for hearing loss, ear pain, nosebleeds, congestion, sore throat, neck pain, tinnitus and ear discharge.  Eyes: Negative for blurred vision, double vision, photophobia, pain, discharge and redness.  Respiratory: Negative for cough, hemoptysis, sputum production, shortness of breath, wheezing and stridor.  Cardiovascular: Negative for chest pain, palpitations, orthopnea, claudication, leg swelling and PND.  Gastrointestinal: Positive for abdominal pain. Negative for heartburn, nausea, vomiting, diarrhea, constipation, blood in stool and melena.  Genitourinary: Negative for dysuria, urgency, frequency, hematuria and flank pain.  Musculoskeletal: Negative for myalgias, back pain, joint pain and falls.  Skin: Negative for itching and rash.  Neurological: Negative for dizziness, tingling, tremors, sensory change, speech change, focal weakness, seizures, loss of consciousness, weakness and headaches.  Endo/Heme/Allergies: Negative for environmental allergies and polydipsia. Does not bruise/bleed easily.  Psychiatric/Behavioral: Negative for depression, suicidal ideas, hallucinations, memory loss and substance abuse. The patient is not nervous/anxious and does not have insomnia.     PHYSICAL EXAM:  Blood pressure 100/70, pulse 72, weight 114 lb 4.8 oz (51.846 kg), unknown if currently breastfeeding.  Vitals reviewed. Constitutional: She is oriented to person, place, and time. She appears well-developed and  well-nourished.  HENT:  Head: Normocephalic and atraumatic.  Right Ear: External ear normal.  Left Ear: External ear normal.  Nose: Nose normal.  Mouth/Throat: Oropharynx is clear and moist.  Eyes: Conjunctivae and EOM are normal. Pupils are equal, round, and reactive to light. Right eye exhibits no discharge. Left eye exhibits no discharge. No scleral icterus.  Neck: Normal range of motion. Neck supple. No tracheal deviation present. No thyromegaly present.  Cardiovascular: Normal rate, regular rhythm, normal heart sounds and intact distal pulses. Exam reveals no gallop and no friction rub.  No murmur heard. Respiratory: Effort normal and breath sounds normal. No respiratory distress. She has no wheezes. She has no rales. She exhibits no tenderness.  GI: Soft. Bowel sounds are normal. She exhibits no distension and no mass. There is tenderness. There is no rebound and no guarding.  Genitourinary:   Vulva is normal without lesions Vagina is pink moist without discharge Cervix normal in appearance and pap is normal Uterus is normal size, contour, position, consistency, mobility, non-tender Adnexa is negative with normal sized ovaries by sonogram  Musculoskeletal: Normal range of motion. She exhibits no edema and no tenderness.  Neurological: She is alert and oriented to person, place, and time. She has normal reflexes. She displays normal reflexes. No cranial nerve deficit. She exhibits normal muscle tone. Coordination normal.  Skin: Skin is warm and dry. No rash noted. No erythema. No pallor.  Psychiatric: She has a normal mood and affect. Her behavior is normal. Judgment and thought content normal.    Labs: No results found for this or any previous visit (from the past 336 hour(s)).  EKG: Orders placed or performed during the hospital encounter of 04/09/15  . EKG 12-Lead  . EKG 12-Lead  . EKG 12-Lead  . EKG 12-Lead  . EKG    Imaging Studies:  Imaging  Results    US Ob Limited  06/16/2015 GYNECOLOGIC SONOGRAM Alicia [name] is a 24 y.o. G2P1011 LMP unknown for a pelvic sonogram for vag bleeding, s/p surgical abortion 04/27/2015, Uterus 7.04 x 4.1 x 6.2 cm, normal anteverted uterus Endometrium 14 mm, asymmetrical, complex thick eec w/color flow Right ovary 3.1 x 4.1 x 1.8 cm, wnl Left ovary 3.8 x 2.9 x 1.8 cm, wnl Technician Comments: US PELVIS T/A T/V: s/p surgical abortion ,normal anteverted uterus,complex thick EEC 68mm w/color flow,normal ov's bilat,small amount of simple cul de sac fluid [employer] 06/16/2015 10:21 AM Clinical Impression and recommendations: I have reviewed the sonogram results above, combined with the patient's current clinical course, below are my impressions and any appropriate recommendations for management based on the sonographic findings. Pt had TAB and has continued to bleed so sonogram ordered reveals; Probable retained decidual cast or POC Otherwise normal anatomy S/P cytotec Couple of times without passing will need uterine curettage to remove Jaimee Corum H 06/16/2015 10:33 AM   US Ob Transvaginal  06/16/2015 GYNECOLOGIC SONOGRAM JAILA SCHELLHORN is a 24 y.o. G2P1011 LMP unknown for a pelvic sonogram for vag bleeding, s/p surgical abortion 04/27/2015, Uterus 7.04 x 4.1 x 6.2 cm, normal anteverted uterus Endometrium 14 mm, asymmetrical, complex thick eec w/color flow Right ovary 3.1 x 4.1 x 1.8 cm, wnl Left ovary 3.8 x 2.9 x 1.8 cm, wnl Technician Comments: US PELVIS T/A T/V: s/p surgical abortion ,normal anteverted uterus,complex thick EEC 56mm w/color flow,normal ov's bilat,small amount of simple cul de sac  fluid Silver Huguenin 06/16/2015 10:21 AM Clinical Impression and recommendations: I have reviewed the sonogram results above, combined with the patient's current  clinical course, below are my impressions and any appropriate recommendations for management based on the sonographic findings. Pt had TAB and has continued to bleed so sonogram ordered reveals; Probable retained decidual cast or POC Otherwise normal anatomy S/P cytotec Couple of times without passing will need uterine curettage to remove Ayodele Sangalang H 06/16/2015 10:33 AM   US Pelvis Complete  05/25/2015 CLINICAL DATA: Status post abortion with persistent pelvic pain and bleeding. EXAM: TRANSABDOMINAL ULTRASOUND OF PELVIS TECHNIQUE: Transabdominal ultrasound examination of the pelvis was performed including evaluation of the uterus, ovaries, adnexal regions, and pelvic cul-de-sac. COMPARISON: 04/02/2015 FINDINGS: Uterus Measurements: 8.4 x 4.5 x 6.7 cm. No myometrial abnormalities. Endometrium Thickness: 9.8 mm. Heterogeneous endometrial debris with increased blood flow suspicious for retained products. Right ovary Not visualized. Left ovary Measurements: 3.8 x 1.9 x 2.8 cm. Normal appearance/no adnexal mass. Other findings: No free fluid IMPRESSION: Heterogeneous endometrial debris with blood flow worrisome for retained products. Non visualization of the right ovary. The left ovary is normal. Electronically Signed By: Marijo Sanes M.D. On: 05/25/2015 15:17       Assessment: Retained POC Patient Active Problem List   Diagnosis Date Noted  . Vaginal itching 12/31/2014  . Vaginal discharge 12/31/2014  . Yeast infection 12/31/2014  . BV (bacterial vaginosis) 12/01/2014  . HSV-2 seropositive 12/30/2013    Plan: Suction curettage for retained products of conception vs retained decidual cast s/p pregnancy termination 04/29/2015  Gennifer Potenza H 06/16/2015 10:40 AM

## 2015-06-19 NOTE — Transfer of Care (Signed)
Immediate Anesthesia Transfer of Care Note  Patient: Alicia [name]  Procedure(s) Performed: Procedure(s) (LRB): SUCTION AND SHARP UTERINE CURETTAGE (N/A)  Patient Location: PACU  Anesthesia Type: General  Level of Consciousness: awake  Airway & Oxygen Therapy: Patient Spontanous Breathing and non-rebreather face mask  Post-op Assessment: Report given to PACU RN, Post -op Vital signs reviewed and stable and Patient moving all extremities  Post vital signs: Reviewed and stable  Complications: No apparent anesthesia complications

## 2015-06-19 NOTE — Anesthesia Postprocedure Evaluation (Signed)
  Anesthesia Post-op Note  Patient: Alicia [name]  Procedure(s) Performed: Procedure(s): SUCTION AND SHARP UTERINE CURETTAGE (N/A)  Patient Location: PACU  Anesthesia Type:General  Level of Consciousness: awake and patient cooperative  Airway and Oxygen Therapy: Patient Spontanous Breathing and non-rebreather face mask  Post-op Pain: none  Post-op Assessment: Post-op Vital signs reviewed, Patient's Cardiovascular Status Stable, Respiratory Function Stable and Patent Airway              Post-op Vital Signs: Reviewed and stable  Last Vitals:  Filed Vitals:   06/19/15 0830  BP: 107/72  Temp: 36.6 C  Resp: 20    Complications: No apparent anesthesia complications

## 2015-06-19 NOTE — Op Note (Signed)
Preoperative diagnosis:  Incomplete elective termination in July with ineffective outmatient management attempts  Postoperative diagnosis:  Same as above  Procedure:  Cervical dilation with suction and sharp uterine curettage  Surgeon:  Florian Buff  Anesthesia:  Laryngeal mask airway  Findings:  The patient had an electiver termination of her pregnancy in [place] on 04/29/2015.  She has been seen for on going bleeding and sonogram revealed retained POC.  cytotec has been attempted twice to remedy the problem without success.  She continues to bleed daily sometimes heavily and sonogram reveals the tissue remains  Description of operation:  The patient was taken to the operating room and placed in the supine position.  She underwent laryngeal mask airway general anesthesia.  The patient was placed in the dorsal lithotomy position.  The vagina was prepped and draped in the usual sterile fashion.  A Graves speculum was placed.  The anterior cervix was grasped with a single-tooth tenaculum.  The cervix was did not require dilation.  A #9 curved suction curette was placed in the uterus.  The suction pressure was placed at 55 and several passes were made.  All of the intrauterine contents were removed.  The sharp curette was used x1 to feel uterine crie in all areas.  The patient was given Methergine 0.2 mg IM x1.  There was good hemostasis.  The patient was given ancef 2 grams IV preoperatively.  The patient was given Toradol 30 mg IV preoperatively.  Estimated blood loss for the procedure was minimal.  The patient was awakened from anesthesia taken to the recovery room in good stable condition.  All counts were correct x3.  Florian Buff 06/19/2015 8:13 AM   Because her cervix was open and this has been going on for so long I am going to cover the patient post operatively with antibiotics

## 2015-06-19 NOTE — Interval H&P Note (Signed)
History and Physical Interval Note:  06/19/2015 7:29 AM  Alicia [name]  has presented today for surgery, with the diagnosis of retained products of conception  The various methods of treatment have been discussed with the patient and family. After consideration of risks, benefits and other options for treatment, the patient has consented to  Procedure(s): SUCTION AND SHARP UTERINE CURETTAGE (N/A) as a surgical intervention .  The patient's history has been reviewed, patient examined, no change in status, stable for surgery.  I have reviewed the patient's chart and labs.  Questions were answered to the patient's satisfaction.     [name]

## 2015-06-19 NOTE — Anesthesia Procedure Notes (Addendum)
Procedure Name: LMA Insertion Date/Time: 06/19/2015 7:43 AM Performed by: Vista Deck Pre-anesthesia Checklist: Patient identified, Patient being monitored, Emergency Drugs available, Timeout performed and Suction available Patient Re-evaluated:Patient Re-evaluated prior to inductionOxygen Delivery Method: Circle System Utilized Preoxygenation: Pre-oxygenation with 100% oxygen Intubation Type: IV induction Ventilation: Mask ventilation without difficulty LMA: LMA inserted LMA Size: 3.0 Number of attempts: 1 Placement Confirmation: positive ETCO2 and breath sounds checked- equal and bilateral Tube secured with: Tape

## 2015-06-19 NOTE — Anesthesia Preprocedure Evaluation (Signed)
Anesthesia Evaluation  Patient identified by MRN, date of birth, ID band Patient awake    Reviewed: Allergy & Precautions, H&P , NPO status , Patient's Chart, lab work & pertinent test results  Airway Mallampati: II  TM Distance: >3 FB Neck ROM: Full    Dental no notable dental hx. (+) Teeth Intact   Pulmonary Current Smoker,  breath sounds clear to auscultation  Pulmonary exam normal       Cardiovascular negative cardio ROS Normal cardiovascular examRhythm:Regular Rate:Normal     Neuro/Psych negative neurological ROS  negative psych ROS   GI/Hepatic Cholestasis of pregnancy   Endo/Other  negative endocrine ROS  Renal/GU negative Renal ROS  negative genitourinary   Musculoskeletal negative musculoskeletal ROS (+)   Abdominal   Peds  Hematology negative hematology ROS (+) anemia ,   Anesthesia Other Findings   Reproductive/Obstetrics HSV                             Anesthesia Physical Anesthesia Plan  ASA: II  Anesthesia Plan: General   Post-op Pain Management:    Induction: Intravenous  Airway Management Planned: LMA  Additional Equipment:   Intra-op Plan:   Post-operative Plan: Extubation in OR  Informed Consent: I have reviewed the patients History and Physical, chart, labs and discussed the procedure including the risks, benefits and alternatives for the proposed anesthesia with the patient or authorized representative who has indicated his/her understanding and acceptance.     Plan Discussed with:   Anesthesia Plan Comments:         Anesthesia Quick Evaluation

## 2015-06-21 LAB — TYPE AND SCREEN
ABO/RH(D): O POS
Antibody Screen: NEGATIVE

## 2015-06-22 ENCOUNTER — Encounter (HOSPITAL_COMMUNITY): Payer: Self-pay | Admitting: Obstetrics & Gynecology

## 2015-06-22 ENCOUNTER — Telehealth: Payer: Self-pay | Admitting: *Deleted

## 2015-06-22 NOTE — Telephone Encounter (Signed)
Yes ok 

## 2015-06-23 NOTE — Telephone Encounter (Signed)
Spoke with pt letting her know Dr. Elonda Husky said she can return to work on 06/23/15. Pt to pick up note around lunch time today. [place]

## 2015-06-26 ENCOUNTER — Encounter: Payer: Self-pay | Admitting: Obstetrics & Gynecology

## 2015-06-26 ENCOUNTER — Ambulatory Visit (INDEPENDENT_AMBULATORY_CARE_PROVIDER_SITE_OTHER): Payer: Medicaid Other | Admitting: Obstetrics & Gynecology

## 2015-06-26 VITALS — BP 116/78 | HR 72 | Ht 62.0 in | Wt 116.0 lb

## 2015-06-26 DIAGNOSIS — O034 Incomplete spontaneous abortion without complication: Secondary | ICD-10-CM

## 2015-06-26 DIAGNOSIS — Z9889 Other specified postprocedural states: Secondary | ICD-10-CM

## 2015-06-26 MED ORDER — DESOGESTREL-ETHINYL ESTRADIOL 0.15-30 MG-MCG PO TABS: 1.0000 | ORAL_TABLET | Freq: Every day | ORAL | Status: DC

## 2015-06-26 NOTE — Progress Notes (Signed)
Patient ID: Alicia [name], female   DOB: [birth year], 24 y.o.   MRN: [id number]  HPI: Patient returns for routine postoperative follow-up having undergone suction D&C on 8/26.  The patient's immediate postoperative recovery has been unremarkable. Since hospital discharge the patient reports bleeding has significantly improved.   Current Outpatient Prescriptions: ciprofloxacin (CIPRO) 500 MG tablet, Take 1 tablet (500 mg total) by mouth 2 (two) times daily., Disp: 14 tablet, Rfl: 0 desogestrel-ethinyl estradiol (APRI,EMOQUETTE,SOLIA) 0.15-30 MG-MCG tablet, Take 1 tablet by mouth daily., Disp: 1 Package, Rfl: 11 HYDROcodone-acetaminophen (NORCO/VICODIN) 5-325 MG per tablet, Take 1 tablet by mouth every 6 (six) hours as needed for moderate pain or severe pain. (Patient not taking: Reported on 06/26/2015), Disp: 30 tablet, Rfl: 0 ketorolac (TORADOL) 10 MG tablet, Take 1 tablet (10 mg total) by mouth every 8 (eight) hours as needed. (Patient not taking: Reported on 06/26/2015), Disp: 15 tablet, Rfl: 0 Prenat-FeFum-FePo-FA-Omega 3 (CONCEPT DHA) 53.5-38-1 MG CAPS, Take 1 capsule by mouth daily. (Patient not taking: Reported on 06/26/2015), Disp: 30 capsule, Rfl: 11  No current facility-administered medications for this visit.    Blood pressure 116/78, pulse 72, height 5\' 2"  (1.575 m), weight 116 lb (52.617 kg), last menstrual period 04/29/2015, unknown if currently breastfeeding.  Physical Exam: Vulva:  normal appearing vulva with no masses, tenderness or lesions Vagina:  normal mucosa, scant blood Cervix:  no lesions Uterus:  normal size, contour, position, consistency, mobility, non-tender Adnexa: ovaries:present,  normal adnexa in size, nontender and no masses    Diagnostic Tests: none  Pathology: Retained POC  Impression: Normal post op from a suction D&C for retained POC after a pregnancy termination  Plan: Begin desogestrel 30 mic EE OCP  Follow up: prn    Florian Buff, MD

## 2015-11-09 ENCOUNTER — Ambulatory Visit (INDEPENDENT_AMBULATORY_CARE_PROVIDER_SITE_OTHER): Payer: Medicaid Other | Admitting: Adult Health

## 2015-11-09 ENCOUNTER — Encounter: Payer: Self-pay | Admitting: Adult Health

## 2015-11-09 VITALS — BP 118/70 | HR 114 | Ht 62.0 in | Wt 112.0 lb

## 2015-11-09 DIAGNOSIS — N760 Acute vaginitis: Secondary | ICD-10-CM

## 2015-11-09 DIAGNOSIS — B9689 Other specified bacterial agents as the cause of diseases classified elsewhere: Secondary | ICD-10-CM

## 2015-11-09 DIAGNOSIS — R809 Proteinuria, unspecified: Secondary | ICD-10-CM | POA: Diagnosis not present

## 2015-11-09 DIAGNOSIS — A499 Bacterial infection, unspecified: Secondary | ICD-10-CM

## 2015-11-09 DIAGNOSIS — L298 Other pruritus: Secondary | ICD-10-CM

## 2015-11-09 DIAGNOSIS — N898 Other specified noninflammatory disorders of vagina: Secondary | ICD-10-CM | POA: Diagnosis not present

## 2015-11-09 LAB — POCT URINALYSIS DIPSTICK
Blood, UA: NEGATIVE
GLUCOSE UA: NEGATIVE
LEUKOCYTES UA: NEGATIVE
NITRITE UA: NEGATIVE

## 2015-11-09 LAB — POCT WET PREP (WET MOUNT)
CLUE CELLS WET PREP WHIFF POC: POSITIVE
WBC WET PREP: POSITIVE

## 2015-11-09 MED ORDER — METRONIDAZOLE 500 MG PO TABS: 500.0000 mg | ORAL_TABLET | Freq: Two times a day (BID) | ORAL | Status: DC

## 2015-11-09 NOTE — Patient Instructions (Signed)
Bacterial Vaginosis Bacterial vaginosis is a vaginal infection that occurs when the normal balance of bacteria in the vagina is disrupted. It results from an overgrowth of certain bacteria. This is the most common vaginal infection in women of childbearing age. Treatment is important to prevent complications, especially in pregnant women, as it can cause a premature delivery. CAUSES  Bacterial vaginosis is caused by an increase in harmful bacteria that are normally present in smaller amounts in the vagina. Several different kinds of bacteria can cause bacterial vaginosis. However, the reason that the condition develops is not fully understood. RISK FACTORS Certain activities or behaviors can put you at an increased risk of developing bacterial vaginosis, including:  Having a new sex partner or multiple sex partners.  Douching.  Using an intrauterine device (IUD) for contraception. Women do not get bacterial vaginosis from toilet seats, bedding, swimming pools, or contact with objects around them. SIGNS AND SYMPTOMS  Some women with bacterial vaginosis have no signs or symptoms. Common symptoms include:  Grey vaginal discharge.  A fishlike odor with discharge, especially after sexual intercourse.  Itching or burning of the vagina and vulva.  Burning or pain with urination. DIAGNOSIS  Your health care provider will take a medical history and examine the vagina for signs of bacterial vaginosis. A sample of vaginal fluid may be taken. Your health care provider will look at this sample under a microscope to check for bacteria and abnormal cells. A vaginal pH test may also be done.  TREATMENT  Bacterial vaginosis may be treated with antibiotic medicines. These may be given in the form of a pill or a vaginal cream. A second round of antibiotics may be prescribed if the condition comes back after treatment. Because bacterial vaginosis increases your risk for sexually transmitted diseases, getting  treated can help reduce your risk for chlamydia, gonorrhea, HIV, and herpes. HOME CARE INSTRUCTIONS   Only take over-the-counter or prescription medicines as directed by your health care provider.  If antibiotic medicine was prescribed, take it as directed. Make sure you finish it even if you start to feel better.  Tell all sexual partners that you have a vaginal infection. They should see their health care provider and be treated if they have problems, such as a mild rash or itching.  During treatment, it is important that you follow these instructions:  Avoid sexual activity or use condoms correctly.  Do not douche.  Avoid alcohol as directed by your health care provider.  Avoid breastfeeding as directed by your health care provider. SEEK MEDICAL CARE IF:   Your symptoms are not improving after 3 days of treatment.  You have increased discharge or pain.  You have a fever. MAKE SURE YOU:   Understand these instructions.  Will watch your condition.  Will get help right away if you are not doing well or get worse. FOR MORE INFORMATION  Centers for Disease Control and Prevention, Division of STD Prevention: [employer]raud[employer] American Sexual Health Association (ASHA): www.ashastd.org    This information is not intended to replace advice given to you by your health care provider. Make sure you discuss any questions you have with your health care provider.   Document Released: 10/10/2005 Document Revised: 10/31/2014 Document Reviewed: 05/22/2013 Elsevier Interactive Patient Education 2016 [employer] Increase water Take flagyl no alcohol

## 2015-11-09 NOTE — Progress Notes (Signed)
Subjective:     Patient ID: Alicia [name], female   DOB: Mar 20, 1991, 25 y.o.   MRN: [id number]  HPI Alicia [name] is a 25 year old black female in complaining of tingles in pelvic area, and vaginal discharge after periods.She is on Apri and wants to gain weight, had D&C in August. Has history of HSV, uses condoms.  Review of Systems Patient denies any headaches, hearing loss, fatigue, blurred vision, shortness of breath, chest pain, abdominal pain, problems with bowel movements,  or intercourse. No joint pain or mood swings.See HPI for positives. Reviewed past medical,surgical, social and family history. Reviewed medications and allergies.     Objective:   Physical Exam BP 118/70 mmHg  Pulse 114  Ht 5\' 2"  (1.575 m)  Wt 112 lb (50.803 kg)  BMI 20.48 kg/m2  LMP 10/07/2015  Breastfeeding? No urine trace protein, Skin warm and dry.Pelvic: external genitalia is normal in appearance no lesions, vagina: white discharge with odor,urethra has no lesions or masses noted, cervix:smooth and bulbous, uterus: normal size, shape and contour, non tender, no masses felt, adnexa: no masses or tenderness noted. Bladder is non tender and no masses felt. Wet prep: + for clue cells and +WBCs.     Assessment:     Vaginal discharge Vaginal itch BV Proteinuria     Plan:     UA C&S and GC/CHL sent on urine Rx flagyl 500 mg 1 bid x 7 days, no alcohol, review handout on BV   Follow up in 3 months for physical Increase water  Decrease cigarettes

## 2015-11-10 LAB — MICROSCOPIC EXAMINATION
Bacteria, UA: NONE SEEN
Casts: NONE SEEN /lpf

## 2015-11-10 LAB — URINALYSIS, ROUTINE W REFLEX MICROSCOPIC
BILIRUBIN UA: NEGATIVE
GLUCOSE, UA: NEGATIVE
Ketones, UA: NEGATIVE
Leukocytes, UA: NEGATIVE
NITRITE UA: NEGATIVE
RBC, UA: NEGATIVE
Specific Gravity, UA: 1.028 (ref 1.005–1.030)
UUROB: 1 mg/dL (ref 0.2–1.0)
pH, UA: 6 (ref 5.0–7.5)

## 2015-11-10 LAB — GC/CHLAMYDIA PROBE AMP
Chlamydia trachomatis, NAA: NEGATIVE
NEISSERIA GONORRHOEAE BY PCR: NEGATIVE

## 2015-11-11 LAB — URINE CULTURE

## 2015-11-23 ENCOUNTER — Telehealth: Payer: Self-pay | Admitting: *Deleted

## 2015-11-23 MED ORDER — METRONIDAZOLE 500 MG PO TABS: 500.0000 mg | ORAL_TABLET | Freq: Two times a day (BID) | ORAL | Status: DC

## 2015-11-23 NOTE — Telephone Encounter (Signed)
Pt states [facility] will not cover Flagyl. Per [employer] $15.00 cost. Is there an alternative?

## 2015-11-23 NOTE — Telephone Encounter (Signed)
Will send flagyl to [facility]

## 2015-12-03 ENCOUNTER — Ambulatory Visit (INDEPENDENT_AMBULATORY_CARE_PROVIDER_SITE_OTHER): Payer: Medicaid Other | Admitting: Adult Health

## 2015-12-03 ENCOUNTER — Encounter: Payer: Self-pay | Admitting: Adult Health

## 2015-12-03 VITALS — BP 110/72 | HR 76 | Ht 62.0 in | Wt 109.0 lb

## 2015-12-03 DIAGNOSIS — N9489 Other specified conditions associated with female genital organs and menstrual cycle: Secondary | ICD-10-CM | POA: Diagnosis not present

## 2015-12-03 DIAGNOSIS — B9689 Other specified bacterial agents as the cause of diseases classified elsewhere: Secondary | ICD-10-CM

## 2015-12-03 DIAGNOSIS — N898 Other specified noninflammatory disorders of vagina: Secondary | ICD-10-CM | POA: Diagnosis not present

## 2015-12-03 DIAGNOSIS — A499 Bacterial infection, unspecified: Secondary | ICD-10-CM

## 2015-12-03 DIAGNOSIS — N760 Acute vaginitis: Secondary | ICD-10-CM | POA: Diagnosis not present

## 2015-12-03 HISTORY — DX: Other specified noninflammatory disorders of vagina: N89.8

## 2015-12-03 LAB — POCT WET PREP (WET MOUNT)
CLUE CELLS WET PREP WHIFF POC: POSITIVE
WBC, Wet Prep HPF POC: POSITIVE

## 2015-12-03 MED ORDER — METRONIDAZOLE 0.75 % VA GEL: 1.0000 | Freq: Two times a day (BID) | VAGINAL | Status: DC

## 2015-12-03 NOTE — Patient Instructions (Signed)
Bacterial Vaginosis Bacterial vaginosis is a vaginal infection that occurs when the normal balance of bacteria in the vagina is disrupted. It results from an overgrowth of certain bacteria. This is the most common vaginal infection in women of childbearing age. Treatment is important to prevent complications, especially in pregnant women, as it can cause a premature delivery. CAUSES  Bacterial vaginosis is caused by an increase in harmful bacteria that are normally present in smaller amounts in the vagina. Several different kinds of bacteria can cause bacterial vaginosis. However, the reason that the condition develops is not fully understood. RISK FACTORS Certain activities or behaviors can put you at an increased risk of developing bacterial vaginosis, including:  Having a new sex partner or multiple sex partners.  Douching.  Using an intrauterine device (IUD) for contraception. Women do not get bacterial vaginosis from toilet seats, bedding, swimming pools, or contact with objects around them. SIGNS AND SYMPTOMS  Some women with bacterial vaginosis have no signs or symptoms. Common symptoms include:  Grey vaginal discharge.  A fishlike odor with discharge, especially after sexual intercourse.  Itching or burning of the vagina and vulva.  Burning or pain with urination. DIAGNOSIS  Your health care provider will take a medical history and examine the vagina for signs of bacterial vaginosis. A sample of vaginal fluid may be taken. Your health care provider will look at this sample under a microscope to check for bacteria and abnormal cells. A vaginal pH test may also be done.  TREATMENT  Bacterial vaginosis may be treated with antibiotic medicines. These may be given in the form of a pill or a vaginal cream. A second round of antibiotics may be prescribed if the condition comes back after treatment. Because bacterial vaginosis increases your risk for sexually transmitted diseases, getting  treated can help reduce your risk for chlamydia, gonorrhea, HIV, and herpes. HOME CARE INSTRUCTIONS   Only take over-the-counter or prescription medicines as directed by your health care provider.  If antibiotic medicine was prescribed, take it as directed. Make sure you finish it even if you start to feel better.  Tell all sexual partners that you have a vaginal infection. They should see their health care provider and be treated if they have problems, such as a mild rash or itching.  During treatment, it is important that you follow these instructions:  Avoid sexual activity or use condoms correctly.  Do not douche.  Avoid alcohol as directed by your health care provider.  Avoid breastfeeding as directed by your health care provider. SEEK MEDICAL CARE IF:   Your symptoms are not improving after 3 days of treatment.  You have increased discharge or pain.  You have a fever. MAKE SURE YOU:   Understand these instructions.  Will watch your condition.  Will get help right away if you are not doing well or get worse. FOR MORE INFORMATION  Centers for Disease Control and Prevention, Division of STD Prevention: [employer] American Sexual Health Association (ASHA): www.ashastd.org    This information is not intended to replace advice given to you by your health care provider. Make sure you discuss any questions you have with your health care provider.   Document Released: 10/10/2005 Document Revised: 10/31/2014 Document Reviewed: 05/22/2013 Elsevier Interactive Patient Education 2016 [employer]. Use metrogel bid

## 2015-12-03 NOTE — Progress Notes (Signed)
Subjective:     Patient ID: Alicia [name], female   DOB: April 22, 1991, 25 y.o.   MRN: [id number]  HPI Alicia [name] is a 25 year old black female 25 year old black female back in complaining of vaginal odor and discharge, used flagyl in January for BV, had negative GC/CHL then, no sex since.   Review of Systems Patient denies any headaches, hearing loss, fatigue, blurred vision, shortness of breath, chest pain, abdominal pain, problems with bowel movements, urination, or intercourse. No joint pain or mood swings.See HPI for positives. Reviewed past medical,surgical, social and family history. Reviewed medications and allergies.     Objective:   Physical Exam BP 110/72 mmHg  Pulse 76  Ht 5\' 2"  (1.575 m)  Wt 109 lb (49.442 kg)  BMI 19.93 kg/m2  LMP 11/02/2015  Breastfeeding? No Skin warm and dry.Pelvic: external genitalia is normal in appearance no lesions, vagina: pinkish discharge with odor,urethra has no lesions or masses noted, cervix:smooth, uterus: normal size, shape and contour, non tender, no masses felt, adnexa: no masses or tenderness noted. Bladder is non tender and no masses felt. Wet prep: + for clue cells and +WBCs.     Assessment:    Vaginal discharge Vaginal odor BV    Plan:   Review handout on BV, no alcohol or sex Rx metrogel 1 applicator bid in vagina Follow up prn

## 2015-12-23 ENCOUNTER — Ambulatory Visit (INDEPENDENT_AMBULATORY_CARE_PROVIDER_SITE_OTHER): Payer: 59 | Admitting: Adult Health

## 2015-12-23 ENCOUNTER — Encounter: Payer: Self-pay | Admitting: Adult Health

## 2015-12-23 VITALS — BP 102/70 | HR 72 | Ht 62.0 in | Wt 106.0 lb

## 2015-12-23 DIAGNOSIS — R350 Frequency of micturition: Secondary | ICD-10-CM | POA: Diagnosis not present

## 2015-12-23 DIAGNOSIS — R319 Hematuria, unspecified: Secondary | ICD-10-CM

## 2015-12-23 HISTORY — DX: Hematuria, unspecified: R31.9

## 2015-12-23 HISTORY — DX: Frequency of micturition: R35.0

## 2015-12-23 MED ORDER — NITROFURANTOIN MONOHYD MACRO 100 MG PO CAPS: 100.0000 mg | ORAL_CAPSULE | Freq: Two times a day (BID) | ORAL | Status: DC

## 2015-12-23 NOTE — Patient Instructions (Signed)
Push fluids Take macrobid Try AZO Follow up prn

## 2015-12-23 NOTE — Progress Notes (Signed)
Subjective:     Patient ID: Alicia [name], female   DOB: 1991/08/12, 25 y.o.   MRN: [id number]  HPI [place] is a 25 year old black female in complaining of blood in her urine for 2 days, and urinary frequency.  Review of Systems Patient denies any headaches, hearing loss, fatigue, blurred vision, shortness of breath, chest pain, abdominal pain, problems with bowel movements, or intercourse. No joint pain or mood swings.See HPI for positives. Reviewed past medical,surgical, social and family history. Reviewed medications and allergies.     Objective:   Physical Exam BP 102/70 mmHg  Pulse 72  Ht 5\' 2"  (1.575 m)  Wt 106 lb (48.081 kg)  BMI 19.38 kg/m2  LMP 11/18/2015 urine 1+ protein and trace blood, Skin warm and dry. Neck: mid line trachea, normal thyroid, good ROM, no lymphadenopathy noted. Lungs: clear to ausculation bilaterally. Cardiovascular: regular rate and rhythm.ABdomen soft, + tenderness over bladder, No CVAT    Assessment:    Urinary frequency  Blood in urine        Plan:     UA C&S sent Rx macrobid 1 bid x 7 days Push fluids Try AZO

## 2015-12-24 LAB — URINALYSIS, ROUTINE W REFLEX MICROSCOPIC
Bilirubin, UA: NEGATIVE
GLUCOSE, UA: NEGATIVE
Ketones, UA: NEGATIVE
Leukocytes, UA: NEGATIVE
NITRITE UA: NEGATIVE
PH UA: 6 (ref 5.0–7.5)
Specific Gravity, UA: >=1.03 — AB (ref 1.005–1.030)
UUROB: 1 mg/dL (ref 0.2–1.0)

## 2015-12-24 LAB — MICROSCOPIC EXAMINATION: Casts: NONE SEEN /lpf

## 2015-12-25 LAB — URINE CULTURE

## 2016-01-11 ENCOUNTER — Encounter (HOSPITAL_COMMUNITY): Payer: Self-pay | Admitting: Emergency Medicine

## 2016-01-11 ENCOUNTER — Emergency Department (HOSPITAL_COMMUNITY)
Admission: EM | Admit: 2016-01-11 | Discharge: 2016-01-11 | Disposition: A | Discharge status: Home / Self Care | Payer: Managed Care, Other (non HMO) | Attending: Emergency Medicine | Admitting: Emergency Medicine

## 2016-01-11 DIAGNOSIS — B349 Viral infection, unspecified: Secondary | ICD-10-CM | POA: Insufficient documentation

## 2016-01-11 DIAGNOSIS — F17210 Nicotine dependence, cigarettes, uncomplicated: Secondary | ICD-10-CM | POA: Insufficient documentation

## 2016-01-11 DIAGNOSIS — Z793 Long term (current) use of hormonal contraceptives: Secondary | ICD-10-CM | POA: Insufficient documentation

## 2016-01-11 LAB — COMPREHENSIVE METABOLIC PANEL
ALBUMIN: 4 g/dL (ref 3.5–5.0)
ALK PHOS: 62 U/L (ref 38–126)
ALT: 53 U/L (ref 14–54)
AST: 20 U/L (ref 15–41)
Anion gap: 6 (ref 5–15)
BILIRUBIN TOTAL: 0.5 mg/dL (ref 0.3–1.2)
BUN: 13 mg/dL (ref 6–20)
CO2: 27 mmol/L (ref 22–32)
Calcium: 8.9 mg/dL (ref 8.9–10.3)
Chloride: 104 mmol/L (ref 101–111)
Creatinine, Ser: 0.54 mg/dL (ref 0.44–1.00)
GFR calc Af Amer: >60 mL/min (ref >60)
GFR calc non Af Amer: >60 mL/min (ref >60)
Glucose, Bld: 92 mg/dL (ref 65–99)
POTASSIUM: 3.9 mmol/L (ref 3.5–5.1)
Sodium: 137 mmol/L (ref 135–145)
Total Protein: 8 g/dL (ref 6.5–8.1)

## 2016-01-11 LAB — URINALYSIS, ROUTINE W REFLEX MICROSCOPIC
Bilirubin Urine: NEGATIVE
GLUCOSE, UA: NEGATIVE mg/dL
HGB URINE DIPSTICK: NEGATIVE
Ketones, ur: NEGATIVE mg/dL
LEUKOCYTES UA: NEGATIVE
Nitrite: NEGATIVE
PH: 5 (ref 5.0–8.0)
Protein, ur: NEGATIVE mg/dL
SPECIFIC GRAVITY, URINE: 1.03 (ref 1.005–1.030)

## 2016-01-11 LAB — CBC
HEMATOCRIT: 36.7 % (ref 36.0–46.0)
Hemoglobin: 11.3 g/dL — ABNORMAL LOW (ref 12.0–15.0)
MCH: 23.7 pg — AB (ref 26.0–34.0)
MCHC: 30.8 g/dL (ref 30.0–36.0)
MCV: 77.1 fL — AB (ref 78.0–100.0)
Platelets: 257 10*3/uL (ref 150–400)
RBC: 4.76 MIL/uL (ref 3.87–5.11)
RDW: 18.7 % — ABNORMAL HIGH (ref 11.5–15.5)
WBC: 8.8 10*3/uL (ref 4.0–10.5)

## 2016-01-11 LAB — LIPASE, BLOOD: Lipase: 21 U/L (ref 11–51)

## 2016-01-11 LAB — POC URINE PREG, ED: Preg Test, Ur: NEGATIVE

## 2016-01-11 MED ORDER — ONDANSETRON 4 MG PO TBDP
4.0000 mg | ORAL_TABLET | Freq: Once | ORAL | Status: AC
  Administered 2016-01-11: 4 mg via ORAL
  Filled 2016-01-11: qty 1

## 2016-01-11 MED ORDER — ONDANSETRON 4 MG PO TBDP: 4.0000 mg | ORAL_TABLET | Freq: Three times a day (TID) | ORAL | Status: DC | PRN

## 2016-01-11 MED ORDER — SODIUM CHLORIDE 0.9 % IV SOLN
Freq: Once | INTRAVENOUS | Status: AC
  Administered 2016-01-11: 1000 mL/h via INTRAVENOUS

## 2016-01-11 MED ORDER — IBUPROFEN 400 MG PO TABS
600.0000 mg | ORAL_TABLET | Freq: Once | ORAL | Status: AC
  Administered 2016-01-11: 600 mg via ORAL
  Filled 2016-01-11: qty 2

## 2016-01-11 NOTE — ED Notes (Signed)
Pt tolerated po water without vomiting.

## 2016-01-11 NOTE — Discharge Instructions (Signed)
Viral Infections °A viral infection can be caused by different types of viruses. Most viral infections are not serious and resolve on their own. However, some infections may cause severe symptoms and may lead to further complications. °SYMPTOMS °Viruses can frequently cause: °· Minor sore throat. °· Aches and pains. °· Headaches. °· Runny nose. °· Different types of rashes. °· Watery eyes. °· Tiredness. °· Cough. °· Loss of appetite. °· Gastrointestinal infections, resulting in nausea, vomiting, and diarrhea. °These symptoms do not respond to antibiotics because the infection is not caused by bacteria. However, you might catch a bacterial infection following the viral infection. This is sometimes called a "superinfection." Symptoms of such a bacterial infection may include: °· Worsening sore throat with pus and difficulty swallowing. °· Swollen neck glands. °· Chills and a high or persistent fever. °· Severe headache. °· Tenderness over the sinuses. °· Persistent overall ill feeling (malaise), muscle aches, and tiredness (fatigue). °· Persistent cough. °· Yellow, green, or brown mucus production with coughing. °HOME CARE INSTRUCTIONS  °· Only take over-the-counter or prescription medicines for pain, discomfort, diarrhea, or fever as directed by your caregiver. °· Drink enough water and fluids to keep your urine clear or pale yellow. Sports drinks can provide valuable electrolytes, sugars, and hydration. °· Get plenty of rest and maintain proper nutrition. Soups and broths with crackers or rice are fine. °SEEK IMMEDIATE MEDICAL CARE IF:  °· You have severe headaches, shortness of breath, chest pain, neck pain, or an unusual rash. °· You have uncontrolled vomiting, diarrhea, or you are unable to keep down fluids. °· You or your child has an oral temperature above 102° F (38.9° C), not controlled by medicine. °· Your baby is older than 3 months with a rectal temperature of 102° F (38.9° C) or higher. °· Your baby is 3  months old or younger with a rectal temperature of 100.4° F (38° C) or higher. °MAKE SURE YOU:  °· Understand these instructions. °· Will watch your condition. °· Will get help right away if you are not doing well or get worse. °  °This information is not intended to replace advice given to you by your health care provider. Make sure you discuss any questions you have with your health care provider. °  °Document Released: 07/20/2005 Document Revised: 01/02/2012 Document Reviewed: 03/18/2015 °Elsevier Interactive Patient Education ©2016 Elsevier Inc. ° °

## 2016-01-11 NOTE — ED Notes (Signed)
Pt reports emesis, HA, and generalized body aches that began this morning. Denies diarrhea. Reports approx 6 episodes of vomiting.

## 2016-01-11 NOTE — ED Notes (Signed)
Pt sleeping at this time.

## 2016-01-11 NOTE — ED Provider Notes (Signed)
CSN: [id number]     Arrival date & time 01/11/16  [id number] History   First MD Initiated Contact with Patient 01/11/16 0827     Chief Complaint  Patient presents with  . Emesis     (Consider location/radiation/quality/duration/timing/severity/associated sxs/prior Treatment) Patient is a 25 y.o. female presenting with cough. The history is provided by the patient. No language interpreter was used.  Cough Cough characteristics:  Non-productive Severity:  Moderate Onset quality:  Gradual Timing:  Constant Progression:  Worsening Chronicity:  New Smoker: no   Relieved by:  Nothing Worsened by:  Nothing tried Ineffective treatments:  None tried Associated symptoms: fever, headaches and rhinorrhea   Risk factors: no chemical exposure     Past Medical History  Diagnosis Date  . Urinary tract infection   . HSV-2 infection   . Heavy menstrual period 04/28/2014  . Vaginal itching 12/31/2014  . Vaginal discharge 12/31/2014  . Yeast infection 12/31/2014  . Contraceptive management 01/22/2015  . Anemia   . Vaginal odor 12/03/2015  . Urinary frequency 12/23/2015  . Blood in urine 12/23/2015   Past Surgical History  Procedure Laterality Date  . No past surgeries    . Dilation and curettage of uterus N/A 06/19/2015    Procedure: SUCTION AND SHARP UTERINE CURETTAGE;  Surgeon: Florian Buff, MD;  Location: [facility];  Service: Gynecology;  Laterality: N/A;   Family History  Problem Relation Age of Onset  . Hypertension Mother   . Anemia Mother   . Cancer Maternal Grandmother     breast  . Heart attack Paternal Grandfather    Social History  Substance Use Topics  . Smoking status: Current Every Day Smoker -- 0.50 packs/day for 2 years    Types: Cigarettes  . Smokeless tobacco: Never Used     Comment: smokes 3 cig daily  . Alcohol Use: No   OB History    Gravida Para Term Preterm AB TAB SAB Ectopic Multiple Living   2 1 1  1 1    1       Obstetric Comments   TAB 16 weeks     Review of  Systems  Constitutional: Positive for fever.  HENT: Positive for rhinorrhea.   Respiratory: Positive for cough.   Neurological: Positive for headaches.  All other systems reviewed and are negative.     Allergies  Penicillins and Sulfa antibiotics  Home Medications   Prior to Admission medications   Medication Sig Start Date End Date Taking? Authorizing Provider  desogestrel-ethinyl estradiol (APRI,EMOQUETTE,SOLIA) 0.15-30 MG-MCG tablet Take 1 tablet by mouth daily. 06/26/15   Florian Buff, MD  nitrofurantoin, macrocrystal-monohydrate, (MACROBID) 100 MG capsule Take 1 capsule (100 mg total) by mouth 2 (two) times daily. 12/23/15   Estill Dooms, NP   BP 117/83 mmHg  Pulse 88  Temp(Src) 97.7 F (36.5 C) (Oral)  Resp 18  Ht 5\' 2"  (1.575 m)  Wt 48.081 kg  BMI 19.38 kg/m2  SpO2 100%  LMP 01/09/2016  Breastfeeding? No Physical Exam  Constitutional: She appears well-developed and well-nourished.  HENT:  Head: Normocephalic and atraumatic.  Eyes: Conjunctivae and EOM are normal. Pupils are equal, round, and reactive to light.  Neck: Normal range of motion. Neck supple.  Cardiovascular: Normal rate.   Pulmonary/Chest: Effort normal.  Abdominal: Soft.  Musculoskeletal: Normal range of motion.  Neurological: She is alert.  Skin: Skin is warm.  Psychiatric: She has a normal mood and affect.  Nursing note and vitals reviewed.  ED Course  Procedures (including critical care time) Labs Review Labs Reviewed  CBC - Abnormal; Notable for the following:    Hemoglobin 11.3 (*)    MCV 77.1 (*)    MCH 23.7 (*)    RDW 18.7 (*)    All other components within normal limits  LIPASE, BLOOD  COMPREHENSIVE METABOLIC PANEL  URINALYSIS, ROUTINE W REFLEX MICROSCOPIC (NOT AT [facility])  POC URINE PREG, ED  POC URINE PREG, ED    Imaging Review No results found. I have personally reviewed and evaluated these images and lab results as part of my medical decision-making.   EKG  Interpretation None      MDM  Pt feels better.   Pt reports she feels better.   No vomiting.    Final diagnoses:  Viral illness   Meds ordered this encounter  Medications  . 0.9 %  sodium chloride infusion    Sig:   . ondansetron (ZOFRAN-ODT) disintegrating tablet 4 mg    Sig:   . ibuprofen (ADVIL,MOTRIN) tablet 600 mg    Sig:   . Phenylephrine-DM-GG-APAP (TYLENOL COLD/FLU SEVERE PO)    Sig: Take 1 tablet by mouth daily as needed (cold).  . ondansetron (ZOFRAN ODT) 4 MG disintegrating tablet    Sig: Take 1 tablet (4 mg total) by mouth every 8 (eight) hours as needed for nausea or vomiting.    Dispense:  20 tablet    Refill:  0    Order Specific Question:  Supervising Provider    Answer:  Noemi Chapel [[id number]]   An After Visit Summary was printed and given to the patient.    Hollace Kinnier [place], PA-C 01/11/16 [place], MD 01/13/16 [phone]

## 2016-03-21 ENCOUNTER — Encounter (HOSPITAL_COMMUNITY): Payer: Self-pay | Admitting: Emergency Medicine

## 2016-03-21 ENCOUNTER — Emergency Department (HOSPITAL_COMMUNITY)
Admission: EM | Admit: 2016-03-21 | Discharge: 2016-03-21 | Disposition: A | Discharge status: Home / Self Care | Payer: 59 | Attending: Emergency Medicine | Admitting: Emergency Medicine

## 2016-03-21 DIAGNOSIS — F17210 Nicotine dependence, cigarettes, uncomplicated: Secondary | ICD-10-CM | POA: Insufficient documentation

## 2016-03-21 DIAGNOSIS — W57XXXA Bitten or stung by nonvenomous insect and other nonvenomous arthropods, initial encounter: Secondary | ICD-10-CM | POA: Insufficient documentation

## 2016-03-21 DIAGNOSIS — Y939 Activity, unspecified: Secondary | ICD-10-CM | POA: Insufficient documentation

## 2016-03-21 DIAGNOSIS — S70362A Insect bite (nonvenomous), left thigh, initial encounter: Secondary | ICD-10-CM | POA: Diagnosis not present

## 2016-03-21 DIAGNOSIS — Y999 Unspecified external cause status: Secondary | ICD-10-CM | POA: Insufficient documentation

## 2016-03-21 DIAGNOSIS — Y929 Unspecified place or not applicable: Secondary | ICD-10-CM | POA: Insufficient documentation

## 2016-03-21 MED ORDER — TRIAMCINOLONE ACETONIDE 0.1 % EX CREA: 1.0000 "application " | TOPICAL_CREAM | Freq: Three times a day (TID) | CUTANEOUS | Status: DC

## 2016-03-21 NOTE — ED Notes (Signed)
Pt states something bit her on the left side of left thigh yesterday and now has a red hot area.

## 2016-03-21 NOTE — ED Provider Notes (Signed)
CSN: [id number]     Arrival date & time 03/21/16  1633 History  By signing my name below, I, Mesha Guinyard, attest that this documentation has been prepared under the direction and in the presence of Treatment Team:  Attending Provider: Merrily Pew, MD Physician Assistant: Kem Parkinson, PA-C.  Electronically Signed: Verlee Monte, Medical Scribe. 03/21/2016. 5:12 PM.   Chief Complaint  Patient presents with  . Insect Bite   The history is provided by the patient. No language interpreter was used.   HPI Comments: Alicia [name] is a 25 y.o. female who presents to the Emergency Department complaining of insect bite on her left lower thigh onset 2 days ago. Pt mentions she didn't feel it initially. Pt reports erythema from bite has grown since yesterday. She states that she is having associated symptoms of itchiness, tenderness to site, and warmth. She states that she has not tried medications for her symptoms. She denies being allergic to bees.  She also denies difficulty swallowing, swelling, fever, chills  History reviewed. No pertinent past medical history. History reviewed. No pertinent past surgical history. History reviewed. No pertinent family history. Social History  Substance Use Topics  . Smoking status: Current Every Day Smoker -- 0.50 packs/day    Types: Cigarettes  . Smokeless tobacco: None  . Alcohol Use: No   OB History    No data available     Review of Systems  Constitutional: Negative for fever and chills.  HENT: Negative for congestion.   Respiratory: Negative for shortness of breath.   Gastrointestinal: Negative for nausea and vomiting.  Musculoskeletal: Negative for arthralgias and gait problem.  Skin:       Insect bite  Neurological: Negative for weakness and headaches.    Allergies  Penicillins  Home Medications   Prior to Admission medications   Not on File   BP 124/71 mmHg  Pulse 81  Temp(Src) 98.8 F (37.1 C) (Temporal)  Resp 16  Ht 5'  2" (1.575 m)  Wt 110 lb (49.896 kg)  BMI 20.11 kg/m2  SpO2 100%  LMP 03/07/2016 Physical Exam  Constitutional: She appears well-developed and well-nourished. No distress.  HENT:  Head: Normocephalic and atraumatic.  Eyes: Conjunctivae are normal.  Neck: Neck supple.  Cardiovascular: Normal rate, regular rhythm and normal heart sounds.  Exam reveals no gallop and no friction rub.   No murmur heard. Pulmonary/Chest: Effort normal and breath sounds normal. No respiratory distress. She has no wheezes. She has no rales.  Neurological: She is alert.  Skin: Skin is warm and dry.  Localized erythema to lateral left thigh with what appears to be insect bite - no edema or induration  Psychiatric: She has a normal mood and affect. Her behavior is normal.  Nursing note and vitals reviewed.  ED Course  Procedures  DIAGNOSTIC STUDIES: Oxygen Saturation is 100% on RA, NL by my interpretation.  COORDINATION OF CARE: 5:12 PM Discussed treatment plan with pt at bedside and pt agreed to plan.  MDM   Final diagnoses:  Insect bite    Local rxn to likely insect bite to the lateral left thigh.  No abscess.  NV intact.  Pt agrees to steroid cream, benadryl and cool compresses.    I personally performed the services described in this documentation, which was scribed in my presence. The recorded information has been reviewed and is accurate.    Kem Parkinson, PA-C 03/21/16 1731  Merrily Pew, MD 03/21/16 [phone]

## 2016-03-21 NOTE — ED Notes (Signed)
Patient verbalizes understanding of discharge instructions, prescriptions, home care and follow up care. Patient out of department at this time. 

## 2016-04-27 ENCOUNTER — Ambulatory Visit (INDEPENDENT_AMBULATORY_CARE_PROVIDER_SITE_OTHER): Payer: 59 | Admitting: Obstetrics & Gynecology

## 2016-04-27 ENCOUNTER — Encounter: Payer: Self-pay | Admitting: Obstetrics & Gynecology

## 2016-04-27 VITALS — BP 110/80 | HR 72 | Wt 106.0 lb

## 2016-04-27 DIAGNOSIS — K2100 Gastro-esophageal reflux disease with esophagitis, without bleeding: Secondary | ICD-10-CM

## 2016-04-27 DIAGNOSIS — Z3009 Encounter for other general counseling and advice on contraception: Secondary | ICD-10-CM

## 2016-04-27 MED ORDER — MEDROXYPROGESTERONE ACETATE 150 MG/ML IM SUSP: 150.0000 mg | INTRAMUSCULAR | Status: DC

## 2016-04-27 MED ORDER — OMEPRAZOLE 20 MG PO CPDR: 20.0000 mg | DELAYED_RELEASE_CAPSULE | Freq: Every day | ORAL | Status: DC

## 2016-04-27 NOTE — Progress Notes (Signed)
Patient ID: Alicia [name], female   DOB: 1991-07-09, 25 y.o.   MRN: [id number]      Chief Complaint  Patient presents with  . wants switch birth control    wants Depo shot    Blood pressure 110/80, pulse 72, weight 106 lb (48.1 kg), last menstrual period 04/12/2016.  25 y.o. G2P1011 Patient's last menstrual period was 04/12/2016. The current method of family planning is OCP (estrogen/progesterone).  Subjective Pt wants to go back to using depo provera Also continues to have persistent GERD symptoms  Objective   Pertinent ROS   Labs or studies     Impression Diagnoses this Encounter::   ICD-9-CM ICD-10-CM   1. Gastroesophageal reflux disease with esophagitis 530.11 K21.0   2. General counseling and advice for contraceptive management V25.09 Z30.09     Established relevant diagnosis(es):   Plan/Recommendations: Meds ordered this encounter  Medications  . DISCONTD: omeprazole (PRILOSEC) 20 MG capsule    Sig: Take 1 capsule (20 mg total) by mouth daily. 1 tablet a day    Dispense:  30 capsule    Refill:  6  . medroxyPROGESTERone (DEPO-PROVERA) 150 MG/ML injection    Sig: Inject 1 mL (150 mg total) into the muscle every 3 (three) months.    Dispense:  1 mL    Refill:  3    Labs or Scans Ordered: No orders of the defined types were placed in this encounter.   Management::   Follow up Return if symptoms worsen or fail to improve, for come back in for depo shot on period.        Face to face time:  15 minutes  Greater than 50% of the visit time was spent in counseling and coordination of care with the patient.  The summary and outline of the counseling and care coordination is summarized in the note above.   All questions were answered.

## 2016-05-11 ENCOUNTER — Ambulatory Visit: Payer: Self-pay

## 2016-05-12 ENCOUNTER — Encounter: Payer: Self-pay | Admitting: *Deleted

## 2016-05-12 ENCOUNTER — Ambulatory Visit (INDEPENDENT_AMBULATORY_CARE_PROVIDER_SITE_OTHER): Payer: 59 | Admitting: *Deleted

## 2016-05-12 ENCOUNTER — Ambulatory Visit: Payer: Self-pay

## 2016-05-12 DIAGNOSIS — Z3042 Encounter for surveillance of injectable contraceptive: Secondary | ICD-10-CM | POA: Diagnosis not present

## 2016-05-12 DIAGNOSIS — Z3202 Encounter for pregnancy test, result negative: Secondary | ICD-10-CM

## 2016-05-12 DIAGNOSIS — Z308 Encounter for other contraceptive management: Secondary | ICD-10-CM

## 2016-05-12 LAB — POCT URINE PREGNANCY: Preg Test, Ur: NEGATIVE

## 2016-05-12 MED ORDER — MEDROXYPROGESTERONE ACETATE 150 MG/ML IM SUSP
150.0000 mg | Freq: Once | INTRAMUSCULAR | Status: AC
  Administered 2016-05-12: 150 mg via INTRAMUSCULAR

## 2016-05-12 NOTE — Progress Notes (Signed)
Pt here for Depo. Pt saw Dr. Elonda Husky recently and was told could start Depo while on period. Period started yesterday. Pt tolerated shot well. Return in 12 weeks for next shot. [place]

## 2016-06-03 ENCOUNTER — Ambulatory Visit (INDEPENDENT_AMBULATORY_CARE_PROVIDER_SITE_OTHER): Payer: 59 | Admitting: Advanced Practice Midwife

## 2016-06-03 ENCOUNTER — Encounter: Payer: Self-pay | Admitting: Advanced Practice Midwife

## 2016-06-03 VITALS — BP 98/60 | HR 78 | Ht 62.0 in | Wt 111.0 lb

## 2016-06-03 DIAGNOSIS — N760 Acute vaginitis: Secondary | ICD-10-CM | POA: Diagnosis not present

## 2016-06-03 DIAGNOSIS — B9689 Other specified bacterial agents as the cause of diseases classified elsewhere: Secondary | ICD-10-CM

## 2016-06-03 DIAGNOSIS — N898 Other specified noninflammatory disorders of vagina: Secondary | ICD-10-CM

## 2016-06-03 DIAGNOSIS — A499 Bacterial infection, unspecified: Secondary | ICD-10-CM

## 2016-06-03 MED ORDER — METRONIDAZOLE 0.75 % VA GEL: 1.0000 | Freq: Every day | VAGINAL | 1 refills | Status: DC

## 2016-06-03 NOTE — Progress Notes (Signed)
[facility] Visit  Patient name: Alicia [name] MRN [id number]  Date of birth: 07-25-1991  CC & HPI:  Alicia [name] is a 25 y.o. African American female presenting today for c/o vaginal odor and discharge for 2 weeks.  Some irritaion, feels dry and itchy too. Got depo a 3 weeks ago, no bleeding after period stopped  Pertinent History Reviewed:  Medical & Surgical Hx:   Past Medical History:  Diagnosis Date  . Anemia   . Blood in urine 12/23/2015  . Contraceptive management 01/22/2015  . Heavy menstrual period 04/28/2014  . HSV-2 infection   . Urinary frequency 12/23/2015  . Urinary tract infection   . Vaginal discharge 12/31/2014  . Vaginal itching 12/31/2014  . Vaginal odor 12/03/2015  . Yeast infection 12/31/2014   Past Surgical History:  Procedure Laterality Date  . DILATION AND CURETTAGE OF UTERUS N/A 06/19/2015   Procedure: SUCTION AND SHARP UTERINE CURETTAGE;  Surgeon: Florian Buff, MD;  Location: [facility];  Service: Gynecology;  Laterality: N/A;  . NO PAST SURGERIES     Family History  Problem Relation Age of Onset  . Hypertension Mother   . Anemia Mother   . Cancer Maternal Grandmother     breast  . Heart attack Paternal Grandfather     Current Outpatient Prescriptions:  .  medroxyPROGESTERone (DEPO-PROVERA) 150 MG/ML injection, Inject 1 mL (150 mg total) into the muscle every 3 (three) months., Disp: 1 mL, Rfl: 3 Social History: Reviewed -  reports that she has been smoking Cigarettes.  She has a 1.00 pack-year smoking history. She has never used smokeless tobacco.  Review of Systems:   Constitutional: Negative for fever and chills Eyes: Negative for visual disturbances Respiratory: Negative for shortness of breath, dyspnea Cardiovascular: Negative for chest pain or palpitations  Gastrointestinal: Negative for vomiting, diarrhea and constipation; no abdominal pain Genitourinary: Negative for dysuria and urgency Musculoskeletal: Negative for back pain, joint  pain, myalgias  Neurological: Negative for dizziness and headaches    Objective Findings:    Physical Examination: General appearance - well appearing, and in no distress Mental status - alert, oriented to person, place, and time Chest:  Normal respiratory effort Heart - normal rate and regular rhythm Abdomen:  Soft, nontender Pelvic: SSE:  Thin frothy white DC s/slight amine odor.  Wet prep + clue, no WBC or trich.  Musculoskeletal:  Normal range of motion without pain Extremities:  No edema    No results found for this or any previous visit (from the past 24 hour(s)).    Assessment & Plan:  A:   BV P:  Metrogel qhs X5   No Follow-up on file.  CRESENZO-DISHMAN,Lesha Jager CNM 06/03/2016 12:17 PM

## 2016-06-08 ENCOUNTER — Encounter: Payer: Self-pay | Admitting: Advanced Practice Midwife

## 2016-06-22 ENCOUNTER — Telehealth: Payer: Self-pay | Admitting: Obstetrics & Gynecology

## 2016-06-22 NOTE — Telephone Encounter (Signed)
Pt called stating that she needs a referral for a certain doctor that Dr. Elonda Husky has told her to go see. Please contact pt

## 2016-06-22 NOTE — Telephone Encounter (Signed)
Pt states wants to be referred to a MD for a cyst above left eye.Pt states has discussed this in the past with Dr. Elonda Husky. Please advise.

## 2016-06-28 ENCOUNTER — Telehealth: Payer: Self-pay | Admitting: Obstetrics & Gynecology

## 2016-06-28 NOTE — Telephone Encounter (Signed)
Pt requesting a referral for removal of cyst on left eye. Please advise.

## 2016-06-28 NOTE — Telephone Encounter (Signed)
Pt called regarding a referral that Dr. Elonda Husky was suppose to have done for her. Please contact pt

## 2016-06-28 NOTE — Telephone Encounter (Signed)
I told her to just start with her opthamologist, not an optometrist but an opthamologist Didn't think a specific referral was needed for that, generally patients can see them without a referral I thought

## 2016-06-29 NOTE — Telephone Encounter (Signed)
Pt informed per Dr. Elonda Husky no referral needed for an Opthamologist. Pt verbalized understanding.

## 2016-07-04 DIAGNOSIS — L989 Disorder of the skin and subcutaneous tissue, unspecified: Secondary | ICD-10-CM | POA: Diagnosis not present

## 2016-07-13 DIAGNOSIS — L989 Disorder of the skin and subcutaneous tissue, unspecified: Secondary | ICD-10-CM | POA: Diagnosis not present

## 2016-07-24 HISTORY — PX: LIPOMA EXCISION: SHX5283

## 2016-08-02 DIAGNOSIS — Z882 Allergy status to sulfonamides status: Secondary | ICD-10-CM | POA: Diagnosis not present

## 2016-08-02 DIAGNOSIS — Z793 Long term (current) use of hormonal contraceptives: Secondary | ICD-10-CM | POA: Diagnosis not present

## 2016-08-02 DIAGNOSIS — Z87891 Personal history of nicotine dependence: Secondary | ICD-10-CM | POA: Diagnosis not present

## 2016-08-02 DIAGNOSIS — D2339 Other benign neoplasm of skin of other parts of face: Secondary | ICD-10-CM | POA: Diagnosis not present

## 2016-08-02 DIAGNOSIS — D170 Benign lipomatous neoplasm of skin and subcutaneous tissue of head, face and neck: Secondary | ICD-10-CM | POA: Diagnosis not present

## 2016-08-02 DIAGNOSIS — Z880 Allergy status to penicillin: Secondary | ICD-10-CM | POA: Diagnosis not present

## 2016-08-04 ENCOUNTER — Ambulatory Visit: Payer: 59

## 2016-08-05 ENCOUNTER — Encounter: Payer: Self-pay | Admitting: *Deleted

## 2016-08-05 ENCOUNTER — Ambulatory Visit (INDEPENDENT_AMBULATORY_CARE_PROVIDER_SITE_OTHER): Payer: 59 | Admitting: *Deleted

## 2016-08-05 DIAGNOSIS — Z308 Encounter for other contraceptive management: Secondary | ICD-10-CM | POA: Diagnosis not present

## 2016-08-05 DIAGNOSIS — Z3202 Encounter for pregnancy test, result negative: Secondary | ICD-10-CM

## 2016-08-05 DIAGNOSIS — Z3042 Encounter for surveillance of injectable contraceptive: Secondary | ICD-10-CM | POA: Diagnosis not present

## 2016-08-05 LAB — POCT URINE PREGNANCY: PREG TEST UR: NEGATIVE

## 2016-08-05 MED ORDER — MEDROXYPROGESTERONE ACETATE 150 MG/ML IM SUSP
150.0000 mg | Freq: Once | INTRAMUSCULAR | Status: AC
  Administered 2016-08-05: 150 mg via INTRAMUSCULAR

## 2016-08-05 NOTE — Progress Notes (Signed)
Pt here for Depo. Pt tolerated shot well. Return in 12 weeks for next shot. JSY 

## 2016-08-10 ENCOUNTER — Other Ambulatory Visit: Payer: Self-pay | Admitting: Adult Health

## 2016-08-10 DIAGNOSIS — O021 Missed abortion: Secondary | ICD-10-CM

## 2016-08-11 ENCOUNTER — Other Ambulatory Visit: Payer: 59

## 2016-10-19 ENCOUNTER — Emergency Department (HOSPITAL_COMMUNITY)
Admission: EM | Admit: 2016-10-19 | Discharge: 2016-10-19 | Disposition: A | Discharge status: Home / Self Care | Payer: 59 | Attending: Emergency Medicine | Admitting: Emergency Medicine

## 2016-10-19 ENCOUNTER — Encounter (HOSPITAL_COMMUNITY): Payer: Self-pay | Admitting: Emergency Medicine

## 2016-10-19 ENCOUNTER — Emergency Department (HOSPITAL_COMMUNITY): Payer: 59

## 2016-10-19 DIAGNOSIS — Z79899 Other long term (current) drug therapy: Secondary | ICD-10-CM | POA: Insufficient documentation

## 2016-10-19 DIAGNOSIS — Z87891 Personal history of nicotine dependence: Secondary | ICD-10-CM | POA: Diagnosis not present

## 2016-10-19 DIAGNOSIS — J40 Bronchitis, not specified as acute or chronic: Secondary | ICD-10-CM | POA: Diagnosis not present

## 2016-10-19 DIAGNOSIS — R079 Chest pain, unspecified: Secondary | ICD-10-CM | POA: Diagnosis not present

## 2016-10-19 DIAGNOSIS — R05 Cough: Secondary | ICD-10-CM | POA: Diagnosis not present

## 2016-10-19 MED ORDER — DEXAMETHASONE 4 MG PO TABS
8.0000 mg | ORAL_TABLET | Freq: Once | ORAL | Status: AC
Start: 2016-10-19 — End: 2016-10-19
  Administered 2016-10-19: 8 mg via ORAL
  Filled 2016-10-19: qty 2

## 2016-10-19 MED ORDER — ALBUTEROL SULFATE HFA 108 (90 BASE) MCG/ACT IN AERS: 2.0000 | INHALATION_SPRAY | Freq: Four times a day (QID) | RESPIRATORY_TRACT | 2 refills | Status: DC | PRN

## 2016-10-19 MED ORDER — DEXAMETHASONE 4 MG PO TABS: 4.0000 mg | ORAL_TABLET | Freq: Two times a day (BID) | ORAL | 0 refills | Status: DC

## 2016-10-19 NOTE — ED Triage Notes (Signed)
Pt presents with c/o flu like symptoms. States she began with a cold approx 1 week ago but now has a dry cough and body aches all over.

## 2016-10-21 NOTE — ED Provider Notes (Signed)
[facility] Provider Note   CSN: [id number] Arrival date & time: 10/19/16  [id number]     History   Chief Complaint Chief Complaint  Patient presents with  . Generalized Body Aches    body aches, cough x 1 week worsening today. subjective fever at home    HPI Alicia [name] is a 25 y.o. female.  HPI   25yF with cough and congestion. Onset about a week ago with "a cold." In that past day or so has had increased cough, body aches and fatigue. Chills. Mild nausea. No vomiting. No rash. No unusual leg pain or swelling.    Past Medical History:  Diagnosis Date  . Anemia   . Blood in urine 12/23/2015  . Contraceptive management 01/22/2015  . Heavy menstrual period 04/28/2014  . HSV-2 infection   . Urinary frequency 12/23/2015  . Urinary tract infection   . Vaginal discharge 12/31/2014  . Vaginal itching 12/31/2014  . Vaginal odor 12/03/2015  . Yeast infection 12/31/2014    Patient Active Problem List   Diagnosis Date Noted  . Urinary frequency 12/23/2015  . Blood in urine 12/23/2015  . Vaginal odor 12/03/2015  . Vaginal itching 12/31/2014  . Vaginal discharge 12/31/2014  . Yeast infection 12/31/2014  . BV (bacterial vaginosis) 12/01/2014  . 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 DILATION AND CURETTAGE OF UTERUS N/A 06/19/2015   Procedure: SUCTION AND SHARP UTERINE CURETTAGE;  Surgeon: Florian Buff, MD;  Location: [facility];  Service: Gynecology;  Laterality: N/A;  . LIPOMA EXCISION  07/2016   left eye  . NO PAST SURGERIES      OB History    Gravida Para Term Preterm AB Living   2 1 1  0 1 1   SAB TAB Ectopic Multiple Live Births   0 1 0   1      Obstetric Comments   TAB 16 weeks       Home Medications    Prior to Admission medications   Medication Sig Start Date End Date Taking? Authorizing Provider  albuterol (PROVENTIL HFA;VENTOLIN HFA) 108 (90 Base) MCG/ACT inhaler Inhale 2 puffs into the lungs every 6  (six) hours as needed for wheezing or shortness of breath. 10/19/16   Virgel Manifold, MD  dexamethasone (DECADRON) 4 MG tablet Take 1 tablet (4 mg total) by mouth 2 (two) times daily. 10/19/16   Virgel Manifold, MD  HYDROcodone-acetaminophen (NORCO/VICODIN) 5-325 MG tablet Take 1 tablet by mouth as needed.  08/02/16   Historical Provider, MD  medroxyPROGESTERone (DEPO-PROVERA) 150 MG/ML injection Inject 1 mL (150 mg total) into the muscle every 3 (three) months. 04/27/16   Florian Buff, MD    Family History Family History  Problem Relation Age of Onset  . Hypertension Mother   . Anemia Mother   . Cancer Maternal Grandmother     breast  . Heart attack Paternal Grandfather     Social History Social History  Substance Use Topics  . Smoking status: Former Smoker    Packs/day: 0.50    Years: 2.00    Types: Cigarettes  . Smokeless tobacco: Never Used  . Alcohol use No     Allergies   Penicillins and Sulfa antibiotics   Review of Systems Review of Systems  All systems reviewed and negative, other than as noted in HPI.   Physical Exam Updated Vital Signs BP 127/94 (BP Location: Left Arm)   Pulse 117  Temp 98.5 F (36.9 C) (Oral)   Resp 20   Wt 115 lb (52.2 kg)   LMP 10/05/2016 (Approximate)   SpO2 100%   BMI 21.03 kg/m   Physical Exam  Constitutional: She appears well-developed and well-nourished. No distress.  HENT:  Head: Normocephalic and atraumatic.  Eyes: Conjunctivae are normal. Right eye exhibits no discharge. Left eye exhibits no discharge.  Neck: Neck supple.  Cardiovascular: Normal rate, regular rhythm and normal heart sounds.  Exam reveals no gallop and no friction rub.   No murmur heard. Pulmonary/Chest: Effort normal. No respiratory distress. She has wheezes.  Speaks in complete sentences  Abdominal: Soft. She exhibits no distension. There is no tenderness.  Musculoskeletal: She exhibits no edema or tenderness.  Lower extremities symmetric as compared  to each other. No calf tenderness. Negative Homan's. No palpable cords.   Neurological: She is alert.  Skin: Skin is warm and dry.  Psychiatric: She has a normal mood and affect. Her behavior is normal. Thought content normal.  Nursing note and vitals reviewed.    ED Treatments / Results  Labs (all labs ordered are listed, but only abnormal results are displayed) Labs Reviewed - No data to display  EKG  EKG Interpretation None       Radiology No results found.  Procedures Procedures (including critical care time)  Medications Ordered in ED Medications  dexamethasone (DECADRON) tablet 8 mg (8 mg Oral Given 10/19/16 0907)     Initial Impression / Assessment and Plan / ED Course  I have reviewed the triage vital signs and the nursing notes.  Pertinent labs & imaging results that were available during my care of the patient were reviewed by me and considered in my medical decision making (see chart for details).  Clinical Course     25yF with cough, chest tightness and congestion. Mild wheezing on exam. Likely viral bronchitis. Oxygen saturations are good on room air. Her work of breathing is not steadily increased. Plan course of steroids. As needed albuterol inhaler. Return precautions discussed. Outpatient follow-up as needed otherwise.  Final Clinical Impressions(s) / ED Diagnoses   Final diagnoses:  Bronchitis    New Prescriptions Discharge Medication List as of 10/19/2016  8:54 AM    START taking these medications   Details  albuterol (PROVENTIL HFA;VENTOLIN HFA) 108 (90 Base) MCG/ACT inhaler Inhale 2 puffs into the lungs every 6 (six) hours as needed for wheezing or shortness of breath., Starting Wed 10/19/2016, Print    dexamethasone (DECADRON) 4 MG tablet Take 1 tablet (4 mg total) by mouth 2 (two) times daily., Starting Wed 10/19/2016, Print         Virgel Manifold, MD 10/21/16 1515

## 2016-10-31 ENCOUNTER — Encounter: Payer: Self-pay | Admitting: *Deleted

## 2016-10-31 ENCOUNTER — Ambulatory Visit: Payer: 59

## 2016-11-01 ENCOUNTER — Ambulatory Visit: Payer: 59

## 2016-11-08 ENCOUNTER — Encounter: Payer: Self-pay | Admitting: *Deleted

## 2016-11-08 ENCOUNTER — Ambulatory Visit (INDEPENDENT_AMBULATORY_CARE_PROVIDER_SITE_OTHER): Payer: 59 | Admitting: *Deleted

## 2016-11-08 ENCOUNTER — Other Ambulatory Visit: Payer: 59

## 2016-11-08 DIAGNOSIS — Z3042 Encounter for surveillance of injectable contraceptive: Secondary | ICD-10-CM

## 2016-11-08 LAB — BETA HCG QUANT (REF LAB): hCG Quant: <1 m[IU]/mL

## 2016-11-08 MED ORDER — MEDROXYPROGESTERONE ACETATE 150 MG/ML IM SUSP
150.0000 mg | Freq: Once | INTRAMUSCULAR | Status: AC
  Administered 2016-11-08: 150 mg via INTRAMUSCULAR

## 2016-11-08 NOTE — Progress Notes (Signed)
Pt here for Depo. Pt had a neg quant this am. Pt tolerated shot well. Return in 12 weeks for next shot. [place]

## 2017-01-13 ENCOUNTER — Encounter: Payer: Self-pay | Admitting: Women's Health

## 2017-01-13 ENCOUNTER — Ambulatory Visit (INDEPENDENT_AMBULATORY_CARE_PROVIDER_SITE_OTHER): Payer: 59 | Admitting: Women's Health

## 2017-01-13 ENCOUNTER — Other Ambulatory Visit (HOSPITAL_COMMUNITY)
Admission: RE | Admit: 2017-01-13 | Discharge: 2017-01-13 | Disposition: A | Discharge status: Home / Self Care | Payer: 59 | Source: Ambulatory Visit | Attending: Obstetrics & Gynecology | Admitting: Obstetrics & Gynecology

## 2017-01-13 VITALS — BP 104/62 | HR 84 | Ht 62.25 in | Wt 124.0 lb

## 2017-01-13 DIAGNOSIS — Z01419 Encounter for gynecological examination (general) (routine) without abnormal findings: Secondary | ICD-10-CM

## 2017-01-13 NOTE — Progress Notes (Signed)
Subjective:   Alicia [name] is a 26 y.o. G16P1011 African American female here for a routine well-woman exam.  No LMP recorded. Patient has had an injection.    Current complaints: none, injured Lt shoulder 2 days ago picking child up above her head at playground. Has had limited mobility since. Using icy hot, heating pads w/ some relief, feels like it's getting better PCP: none       Does desire labs, declines STI screen other than gc/ct  Social History: Sexual: heterosexual Marital Status: single Living situation: w/ parents Occupation: unemployed Tobacco/alcohol: no tobacco or etoh Illicit drugs: no history of illicit drug use  The following portions of the patient's history were reviewed and updated as appropriate: allergies, current medications, past family history, past medical history, past social history, past surgical history and problem list.  Past Medical History Past Medical History:  Diagnosis Date  . Anemia   . Blood in urine 12/23/2015  . Contraceptive management 01/22/2015  . Heavy menstrual period 04/28/2014  . HSV-2 infection   . Urinary frequency 12/23/2015  . Urinary tract infection   . Vaginal discharge 12/31/2014  . Vaginal itching 12/31/2014  . Vaginal odor 12/03/2015  . Yeast infection 12/31/2014    Past Surgical History Past Surgical History:  Procedure Laterality Date  . DILATION AND CURETTAGE OF UTERUS N/A 06/19/2015   Procedure: SUCTION AND SHARP UTERINE CURETTAGE;  Surgeon: Florian Buff, MD;  Location: [facility];  Service: Gynecology;  Laterality: N/A;  . LIPOMA EXCISION  07/2016   left eye  . NO PAST SURGERIES      Gynecologic History G2P1011  No LMP recorded. Patient has had an injection. Contraception: Depo-Provera injections Last Pap: 01/04/15. Results were: normal, h/o abnormal pap prior to that Last mammogram: never. Results were: n/a Last TCS: never  Obstetric History OB History  Gravida Para Term Preterm AB Living  2 1 1  0 1 1  SAB TAB  Ectopic Multiple Live Births  0 1 0   1    # Outcome Date GA Lbr Len/2nd Weight Sex Delivery Anes PTL Lv  2 TAB 04/29/15 [redacted]w[redacted]d         1 Term 03/08/14 [redacted]w[redacted]d 11:05 / 07:02 5 lb 11.9 oz (2.605 kg) M Vag-Spont EPI  LIV    Obstetric Comments  TAB 16 weeks    Current Medications Current Outpatient Prescriptions on File Prior to Visit  Medication Sig Dispense Refill  . albuterol (PROVENTIL HFA;VENTOLIN HFA) 108 (90 Base) MCG/ACT inhaler Inhale 2 puffs into the lungs every 6 (six) hours as needed for wheezing or shortness of breath. 1 Inhaler 2  . medroxyPROGESTERone (DEPO-PROVERA) 150 MG/ML injection Inject 1 mL (150 mg total) into the muscle every 3 (three) months. 1 mL 3   No current facility-administered medications on file prior to visit.     Review of Systems Patient denies any headaches, blurred vision, shortness of breath, chest pain, abdominal pain, problems with bowel movements, urination, or intercourse.  Objective:  BP 104/62 (BP Location: Right Arm, Patient Position: Sitting, Cuff Size: Normal)   Pulse 84   Ht 5' 2.25" (1.581 m)   Wt 124 lb (56.2 kg)   BMI 22.50 kg/m  Physical Exam  General:  Well developed, well nourished, no acute distress. She is alert and oriented x3. Skin:  Warm and dry Neck:  Midline trachea, no thyromegaly or nodules Cardiovascular: Regular rate and rhythm, no murmur heard Lungs:  Effort normal, all lung fields clear to auscultation  bilaterally Breasts:  No dominant palpable mass, retraction, or nipple discharge Abdomen:  Soft, non tender, no hepatosplenomegaly or masses Pelvic:  External genitalia is normal in appearance.  The vagina is normal in appearance. The cervix is bulbous, no CMT.  Thin prep pap is done w/ reflex HR HPV cotesting. Uterus is felt to be normal size, shape, and contour.  No adnexal masses or tenderness noted. Extremities:  No swelling or varicosities noted Psych:  She has a normal mood and affect Unable to lift Lt shoulder  over head  Assessment:   Healthy well-woman exam Lt shoulder injury  Plan:  CBC, CMP, TSH today, gc/ct from pap F/U as scheduled 4/5 for depo, then 33yr for physical, or sooner if needed Continue icy hot, heating pad to Lt shoulder, if doesn't continue to improve, call [facility] for appt Mammogram @26yo  or sooner if problems Colonoscopy @26yo  or sooner if problems  Tawnya Crook CNM, [facility] 01/13/2017 12:54 PM

## 2017-01-16 LAB — CYTOLOGY - PAP
CHLAMYDIA, DNA PROBE: NEGATIVE
DIAGNOSIS: NEGATIVE
NEISSERIA GONORRHEA: NEGATIVE

## 2017-01-31 ENCOUNTER — Ambulatory Visit (INDEPENDENT_AMBULATORY_CARE_PROVIDER_SITE_OTHER): Payer: 59

## 2017-01-31 DIAGNOSIS — Z3202 Encounter for pregnancy test, result negative: Secondary | ICD-10-CM

## 2017-01-31 DIAGNOSIS — Z3042 Encounter for surveillance of injectable contraceptive: Secondary | ICD-10-CM | POA: Diagnosis not present

## 2017-01-31 LAB — POCT URINE PREGNANCY: Preg Test, Ur: NEGATIVE

## 2017-01-31 MED ORDER — MEDROXYPROGESTERONE ACETATE 150 MG/ML IM SUSP
150.0000 mg | Freq: Once | INTRAMUSCULAR | Status: AC
  Administered 2017-01-31: 150 mg via INTRAMUSCULAR

## 2017-01-31 NOTE — Progress Notes (Signed)
PT here for depo shot 150 mg IM given LT Deltoid. Tolerated well.Return 12 weeks for next shot. [place]

## 2017-04-25 ENCOUNTER — Ambulatory Visit: Payer: 59

## 2017-06-29 ENCOUNTER — Ambulatory Visit: Payer: Self-pay

## 2017-06-29 ENCOUNTER — Other Ambulatory Visit: Payer: Self-pay

## 2017-07-04 ENCOUNTER — Ambulatory Visit: Payer: Self-pay

## 2017-07-04 ENCOUNTER — Telehealth: Payer: Self-pay | Admitting: Obstetrics & Gynecology

## 2017-07-04 ENCOUNTER — Other Ambulatory Visit: Payer: Managed Care, Other (non HMO)

## 2017-07-04 ENCOUNTER — Other Ambulatory Visit: Payer: Self-pay | Admitting: Obstetrics & Gynecology

## 2017-07-04 DIAGNOSIS — Z3042 Encounter for surveillance of injectable contraceptive: Secondary | ICD-10-CM

## 2017-07-04 LAB — BETA HCG QUANT (REF LAB): HCG QUANT: 1 m[IU]/mL

## 2017-07-04 NOTE — Telephone Encounter (Signed)
done

## 2017-07-04 NOTE — Telephone Encounter (Signed)
Pt states she needs a refill on her depo. She would like [facility] to [facility] in [place]. I informed pt that I would send her request to a provider and she should check with her pharmacy in the next 24 hours.

## 2017-07-04 NOTE — Telephone Encounter (Signed)
Patient called stating that she would like a refill of her Depo, Pt states that she needs it sent to another pharmacy that she could pick it up today. Her appointment is today @ 2:30

## 2017-07-05 ENCOUNTER — Ambulatory Visit: Payer: Self-pay

## 2017-07-05 NOTE — Telephone Encounter (Signed)
[facility] to [facility] in [place] per patient request.

## 2017-09-18 IMAGING — DX DG CHEST 2V
2 series · 2 of 2 positions shown · non-contrast
Comparison: None.

CLINICAL DATA: Cough, diffuse chest pain, body aches for a week,
former smoking history

EXAM:
CHEST  2 VIEW

[chest pa]
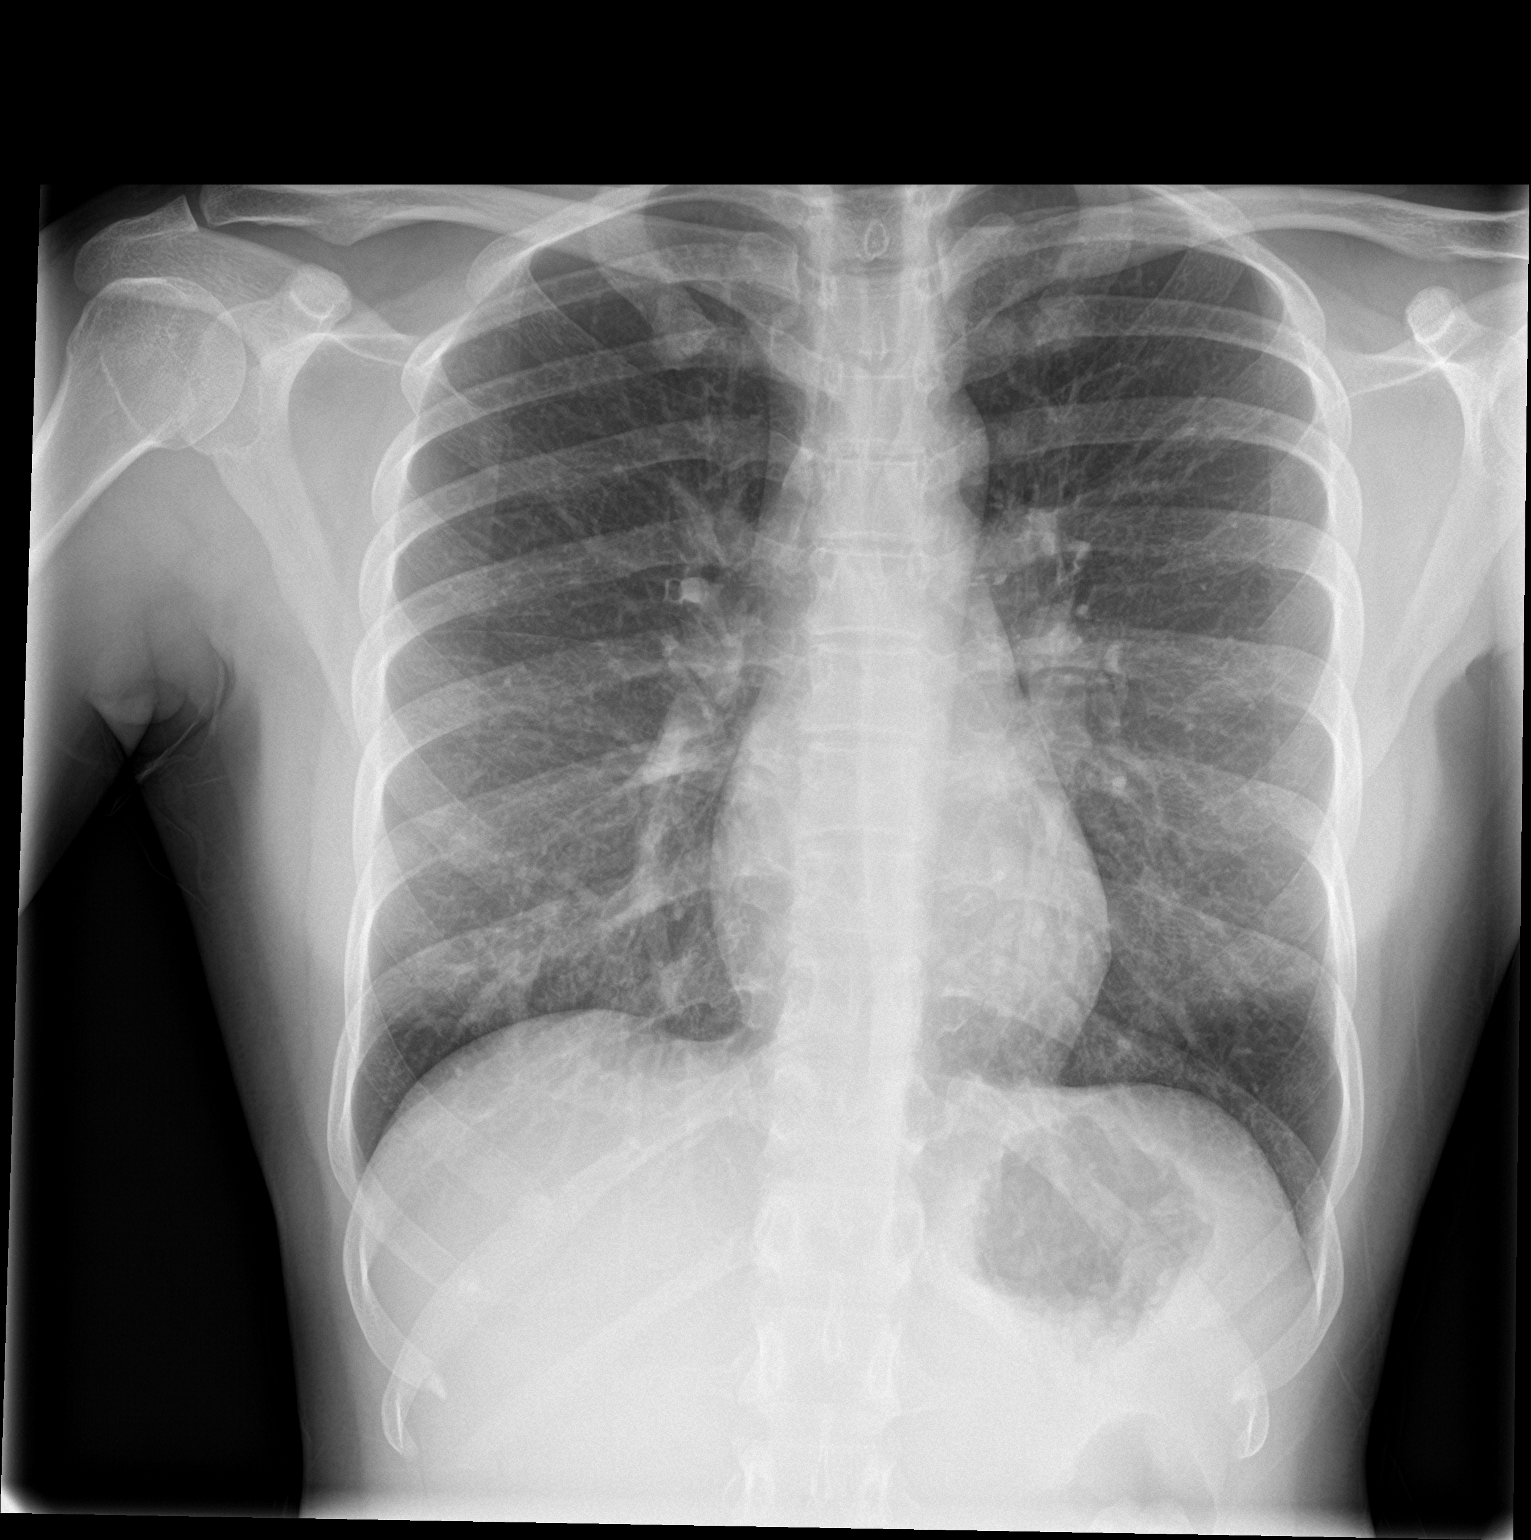

[chest lat]
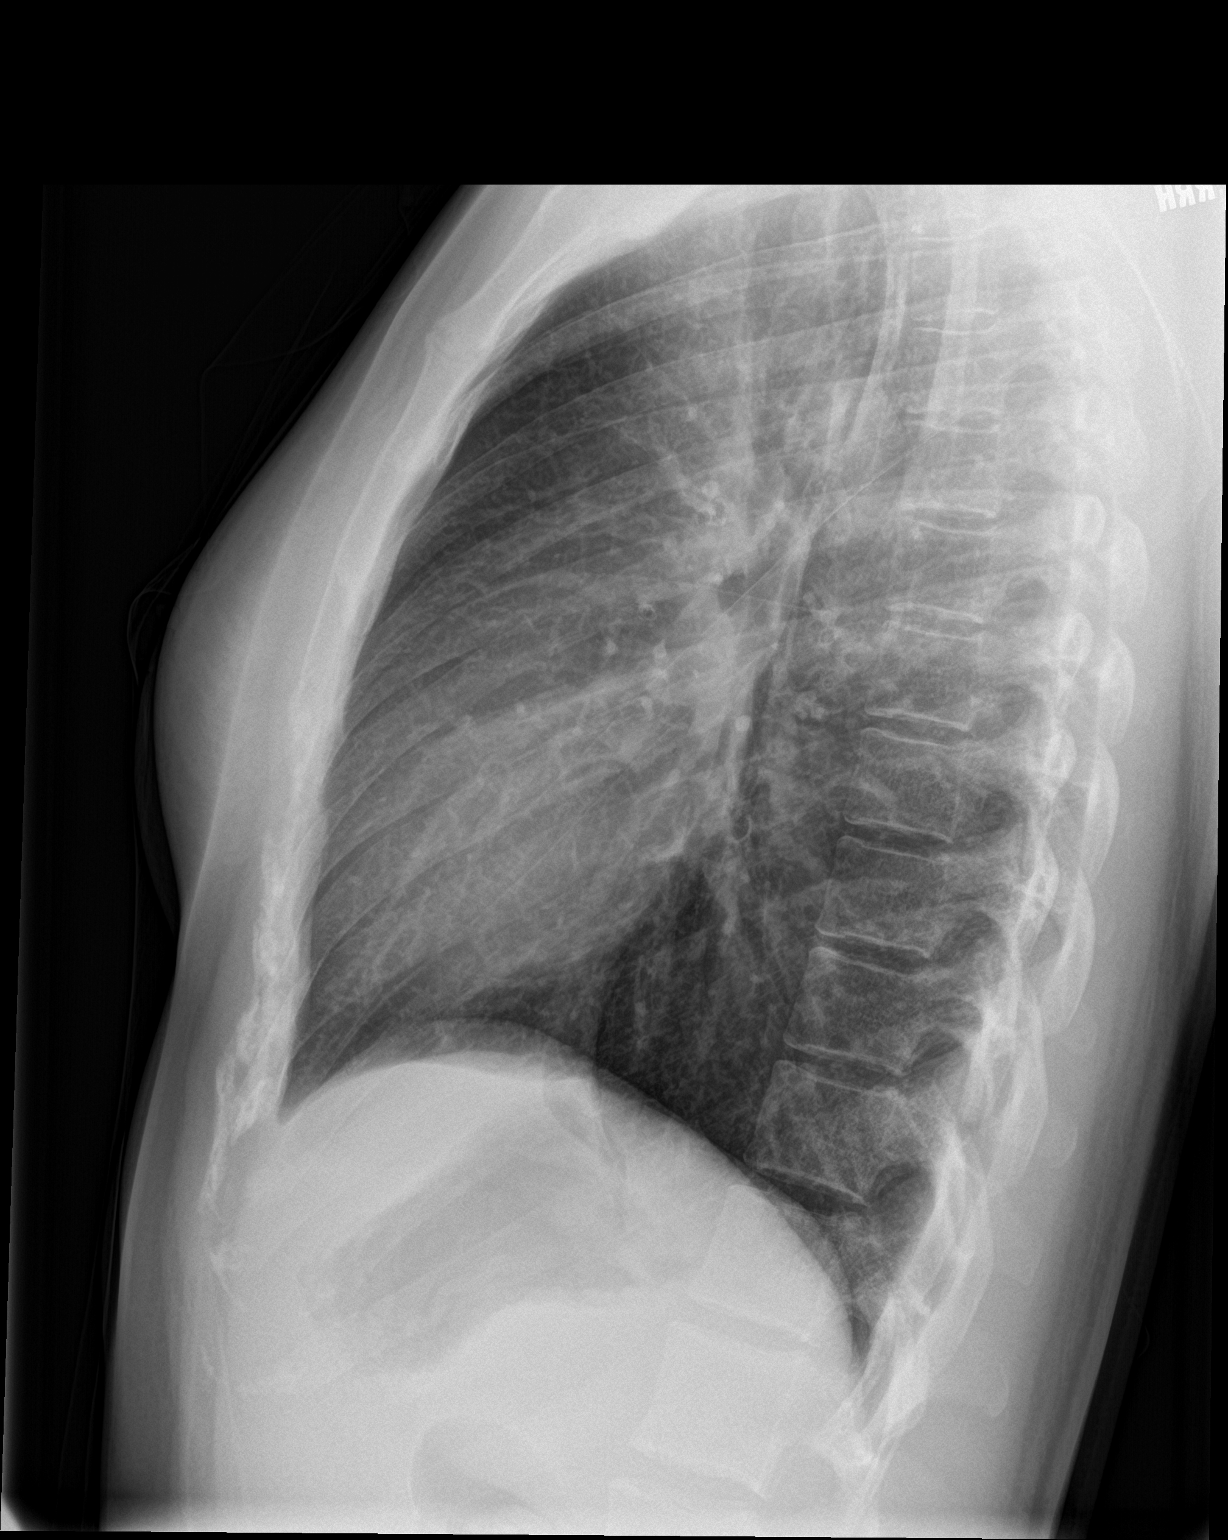

[2 of 2 positions shown; findings below may reference images not displayed]

FINDINGS: No active infiltrate or effusion is seen. Mediastinal and hilar
contours are unremarkable. The heart is within normal limits in
size. No bony abnormality is seen.
IMPRESSION: No active cardiopulmonary disease.

## 2017-11-02 ENCOUNTER — Ambulatory Visit: Payer: Managed Care, Other (non HMO) | Admitting: Family Medicine

## 2017-11-02 ENCOUNTER — Other Ambulatory Visit: Payer: Self-pay

## 2017-11-02 ENCOUNTER — Telehealth: Payer: Self-pay | Admitting: Family Medicine

## 2017-11-02 ENCOUNTER — Encounter: Payer: Self-pay | Admitting: Family Medicine

## 2017-11-02 VITALS — BP 106/76 | HR 93 | Temp 98.1°F | Resp 16 | Ht 62.0 in | Wt 128.2 lb

## 2017-11-02 DIAGNOSIS — Z23 Encounter for immunization: Secondary | ICD-10-CM | POA: Diagnosis not present

## 2017-11-02 DIAGNOSIS — R195 Other fecal abnormalities: Secondary | ICD-10-CM

## 2017-11-02 DIAGNOSIS — R1013 Epigastric pain: Secondary | ICD-10-CM

## 2017-11-02 LAB — POC HEMOCCULT BLD/STL (OFFICE/1-CARD/DIAGNOSTIC): OCCULT BLOOD DATE: NEGATIVE

## 2017-11-02 MED ORDER — ESOMEPRAZOLE MAGNESIUM 20 MG PO PACK: 20.0000 mg | PACK | Freq: Every day | ORAL | 1 refills | Status: DC

## 2017-11-02 MED ORDER — ESOMEPRAZOLE MAGNESIUM 20 MG PO CPDR: 20.0000 mg | DELAYED_RELEASE_CAPSULE | Freq: Every day | ORAL | 0 refills | Status: DC

## 2017-11-02 NOTE — Telephone Encounter (Signed)
[facility], but is not available at this time to the patient. Does she need to go with something else

## 2017-11-02 NOTE — Telephone Encounter (Signed)
Medication should be sent in as Nexium 20 mg, 1 p.o. daily #30, no refills.

## 2017-11-02 NOTE — Telephone Encounter (Signed)
Medication sent to pharmacy  

## 2017-11-02 NOTE — Progress Notes (Addendum)
Patient ID: Alicia [name], female    DOB: Jan 09, 1991, 27 y.o.   MRN: [id number]  Chief Complaint  Patient presents with  . Abdominal Pain    greater than 6 months    Allergies Penicillins and Sulfa antibiotics  Subjective:   Alicia [name] is a 27 y.o. female who presents to [facility] today.  HPI Ms. Mcelvain presents today as a new patient visit to establish care.  She reports that she comes in today to discuss some pain that she has had in her abdomen.  She reports that this pain is been going on for the past 6 months and seems to be getting a little bit worse.  Reports that occurs in the epigastric region and begins within several minutes of eating.  Pain is sharp in quality and lasts for about 30 minutes.  Reports that the pain initially was a 3 out of 10 and now occasionally the pain is a 7 out of 10.  She reports that she usually does try to continue eating when the pain occurs but sometimes she stops.  She has not lost any weight over the past 6 months.  She reports that she has seen some dark stools over the past 6 months but reports they are not black stools.  Denies any bright red blood per rectum or hematochezia.  No nausea, vomiting, or hematemesis.  Has never been diagnosed with reflux or H. pylori in the past.  Has never had any endoscopies or evaluation for this pain.  Has not taken any medications for it.  Review of radiologic studies in this chart reveal CT scan of the abdomen which was performed several years ago for abdominal pain which was within normal limits.  She reports that she is not in any current pain.  She reports that her bowel movements have never really been regular.  Reports that she often has bowel movements every 2 days.  Does not have pain with bowel movements.  Reports that she rarely has formed hard stools.  Reports that stools are usually loose or in quality.  Denies regular diarrhea but reports that occasionally if she eats certain foods  she will have diarrhea and reports that when she does she will get a little bit sweaty with the bowel movement.  Reports this is gone on for a long period of time and her previous doctor put her on Metamucil.  She reports she was not consistent with taking this and is not sure if it helped or not.  She denies NSAID use.  Denies tobacco or alcohol or drug use.  No family history of any GI malignancy or inflammatory bowel disease.  Does not wake up from sleep with any epigastric or abdominal pain.   Abdominal Pain  This is a chronic problem. The current episode started more than 1 month ago (6 months). The problem occurs 2 to 4 times per day. The most recent episode lasted 1 hour. The problem has been gradually worsening. The pain is located in the epigastric region. The pain is at a severity of 7/10. The pain is moderate. The quality of the pain is burning. The abdominal pain does not radiate. Pertinent negatives include no anorexia, arthralgias, belching, constipation, diarrhea, dysuria, fever, flatus, frequency, headaches, hematochezia, hematuria, melena, myalgias, nausea, vomiting or weight loss. The pain is aggravated by eating. The pain is relieved by nothing. She has tried nothing for the symptoms. There is no history of abdominal surgery, colon cancer,  Crohn's disease, gallstones, GERD, irritable bowel syndrome, pancreatitis, PUD or ulcerative colitis.    Past Medical History:  Diagnosis Date  . Anemia   . Blood in urine 12/23/2015  . Contraceptive management 01/22/2015  . Heavy menstrual period 04/28/2014  . HSV-2 infection   . Urinary frequency 12/23/2015  . Urinary tract infection   . Vaginal discharge 12/31/2014  . Vaginal itching 12/31/2014  . Vaginal odor 12/03/2015  . Yeast infection 12/31/2014    Past Surgical History:  Procedure Laterality Date  . DILATION AND CURETTAGE OF UTERUS N/A 06/19/2015   Procedure: SUCTION AND SHARP UTERINE CURETTAGE;  Surgeon: Florian Buff, MD;  Location: [facility];   Service: Gynecology;  Laterality: N/A;  . LIPOMA EXCISION  07/2016   left eye  . NO PAST SURGERIES      Family History  Problem Relation Age of Onset  . Hypertension Mother   . Anemia Mother   . Cancer Maternal Grandmother        breast  . Diabetes Father   . Heart attack Paternal Grandfather      Social History   Socioeconomic History  . Marital status: Single    Spouse name: None  . Number of children: None  . Years of education: None  . Highest education level: None  Social Needs  . Financial resource strain: None  . Food insecurity - worry: None  . Food insecurity - inability: None  . Transportation needs - medical: None  . Transportation needs - non-medical: None  Occupational History  . None  Tobacco Use  . Smoking status: Former Smoker    Packs/day: 0.50    Years: 2.00    Pack years: 1.00    Types: Cigarettes  . Smokeless tobacco: Never Used  Substance and Sexual Activity  . Alcohol use: No  . Drug use: No    Comment: prior to pregnancy  . Sexual activity: Not Currently  Other Topics Concern  . None  Social History Narrative   Single. Not dating. Family lives in [place], [place].   Works as Electrical engineer.   Son, age 27, Lysbeth Galas.   Eats all food groups.   Wear seatbelt.   Christian.            Review of Systems  Constitutional: Negative for activity change, appetite change, chills, fatigue, fever, unexpected weight change and weight loss.  HENT: Negative for trouble swallowing and voice change.   Eyes: Negative for visual disturbance.  Respiratory: Negative for cough, chest tightness and shortness of breath.   Cardiovascular: Negative for chest pain, palpitations and leg swelling.  Gastrointestinal: Positive for abdominal pain. Negative for anorexia, constipation, diarrhea, flatus, hematochezia, melena, nausea and vomiting.  Genitourinary: Negative for difficulty urinating, dysuria, flank pain, frequency, genital sores, hematuria, menstrual problem, pelvic  pain, urgency, vaginal discharge and vaginal pain.  Musculoskeletal: Negative for arthralgias and myalgias.  Neurological: Negative for dizziness, syncope, light-headedness and headaches.  Hematological: Negative for adenopathy. Does not bruise/bleed easily.  Psychiatric/Behavioral: Negative for dysphoric mood and sleep disturbance. The patient is not nervous/anxious.      Objective:   BP 106/76 (BP Location: Left Arm, Patient Position: Sitting, Cuff Size: Normal)   Pulse 93   Temp 98.1 F (36.7 C) (Temporal)   Resp 16   Ht 5\' 2"  (1.575 m)   Wt 128 lb 4 oz (58.2 kg)   LMP 10/24/2017 (Approximate)   SpO2 95%   BMI 23.46 kg/m   Physical Exam  Constitutional: She is oriented  to person, place, and time. She appears well-developed and well-nourished. No distress.  HENT:  Head: Normocephalic and atraumatic.  Mouth/Throat: Oropharynx is clear and moist. No oropharyngeal exudate.  Eyes: Pupils are equal, round, and reactive to light.  Neck: Normal range of motion. Neck supple. No thyromegaly present.  Cardiovascular: Normal rate, regular rhythm and normal heart sounds.  Pulmonary/Chest: Effort normal and breath sounds normal. No respiratory distress. She has no rhonchi.  Abdominal: Normal appearance and bowel sounds are normal. She exhibits no shifting dullness, no distension, no ascites and no mass. There is no hepatosplenomegaly. There is tenderness in the epigastric area. There is no rigidity, no rebound, no guarding and no CVA tenderness. No hernia.  Genitourinary: Rectal exam shows no external hemorrhoid, no fissure, no mass, no tenderness, anal tone normal and guaiac negative stool.  Genitourinary Comments: No blood on exam glove with rectal examination.  Neurological: She is alert and oriented to person, place, and time. No cranial nerve deficit.  Skin: Skin is warm and dry.  Nursing note and vitals reviewed.    Assessment and Plan  1. Dyspepsia Suspect the patient has  esophageal versus gastric irritation.  Discussed with patient possible etiologies of pain.  We discussed causes of dyspepsia including H. pylori, stress, acid production, reflux, medication induced.  We discussed gastroesophageal reflux and lifestyle modifications that can be made to improve symptoms.  Patient was asked to decrease caffeine intake.  She was asked to not eat or drink at least 1 hour before lying down. We did discuss that we would check blood work and breath test for H. pylori today.  If any of the testing came back worrisome would go ahead and refer to GI for evaluation.  Otherwise, if his pain does not worsen or she does not develop any worrisome signs or symptoms then she will follow-up in 1 month after taking the Nexium each day as directed.  At that time if symptoms are not improved then will refer to GI for probable upper endoscopy.  If symptoms do resolve in the next month but subsequently return after stopping PPI, I would recommend that patient receive an upper endoscopy.  Patient was counseled concerning worrisome signs and symptoms.  She was told if she developed black stools, rectal bleeding, worsening abdominal pain, vomiting, or other worrisome symptoms that she needed to go to the emergency department or call our office. - CBC with Differential/Platelet - Amylase - Lipase - Hepatic function panel - H. pylori breath test - esomeprazole (NEXIUM) 20 MG packet; Take 20 mg by mouth daily before breakfast.  Dispense: 30 each; Refill: 1  2. Dark stools Hemoccult performed in office today which was negative. - POC Hemoccult Bld/Stl (1-Cd Office Dx)  3. Immunization due  Vaccinations administered today. - Tdap vaccine greater than or equal to 7yo IM - Flu Vaccine QUAD 36+ mos IM  Patient was told to call with any questions or concerns.  All of her questions were answered. Return in about 4 weeks (around 11/30/2017) for Epigastric pain. Caren Macadam, MD 11/02/2017

## 2017-11-02 NOTE — Telephone Encounter (Signed)
[facility] 20 mg packet was ordered but pharmacy does not have packet, it will have to be ordered and will be in tomorrow. Is there something else the patient is to do, or wait?

## 2017-11-03 ENCOUNTER — Encounter: Payer: Self-pay | Admitting: Family Medicine

## 2017-11-03 LAB — CBC WITH DIFFERENTIAL/PLATELET
BASOS PCT: 1 %
Basophils Absolute: 71 cells/uL (ref 0–200)
EOS ABS: 178 {cells}/uL (ref 15–500)
EOS PCT: 2.5 %
HCT: 40.2 % (ref 35.0–45.0)
HEMOGLOBIN: 13.8 g/dL (ref 11.7–15.5)
Lymphs Abs: 2052 cells/uL (ref 850–3900)
MCH: 29.7 pg (ref 27.0–33.0)
MCHC: 34.3 g/dL (ref 32.0–36.0)
MCV: 86.5 fL (ref 80.0–100.0)
MPV: 12.3 fL (ref 7.5–12.5)
Monocytes Relative: 7.4 %
NEUTROS ABS: 4274 {cells}/uL (ref 1500–7800)
Neutrophils Relative %: 60.2 %
PLATELETS: 265 10*3/uL (ref 140–400)
RBC: 4.65 10*6/uL (ref 3.80–5.10)
RDW: 13.5 % (ref 11.0–15.0)
TOTAL LYMPHOCYTE: 28.9 %
WBC mixed population: 525 cells/uL (ref 200–950)
WBC: 7.1 10*3/uL (ref 3.8–10.8)

## 2017-11-03 LAB — HEPATIC FUNCTION PANEL
AG Ratio: 1.6 (calc) (ref 1.0–2.5)
ALKALINE PHOSPHATASE (APISO): 106 U/L (ref 33–115)
ALT: 16 U/L (ref 6–29)
AST: 13 U/L (ref 10–30)
Albumin: 4.8 g/dL (ref 3.6–5.1)
BILIRUBIN TOTAL: 0.5 mg/dL (ref 0.2–1.2)
Bilirubin, Direct: 0.1 mg/dL (ref 0.0–0.2)
Globulin: 3 g/dL (calc) (ref 1.9–3.7)
Indirect Bilirubin: 0.4 mg/dL (calc) (ref 0.2–1.2)
Total Protein: 7.8 g/dL (ref 6.1–8.1)

## 2017-11-03 LAB — LIPASE

## 2017-11-03 LAB — AMYLASE: AMYLASE: 36 U/L (ref 21–101)

## 2017-11-03 LAB — H. PYLORI BREATH TEST: H. pylori Breath Test: NOT DETECTED

## 2017-11-30 ENCOUNTER — Encounter: Payer: Self-pay | Admitting: Family Medicine

## 2017-12-11 ENCOUNTER — Other Ambulatory Visit: Payer: Self-pay

## 2017-12-11 MED ORDER — ESOMEPRAZOLE MAGNESIUM 20 MG PO CPDR: 20.0000 mg | DELAYED_RELEASE_CAPSULE | Freq: Every day | ORAL | 0 refills | Status: DC

## 2017-12-20 ENCOUNTER — Other Ambulatory Visit: Payer: Self-pay

## 2017-12-20 ENCOUNTER — Encounter: Payer: Self-pay | Admitting: Family Medicine

## 2017-12-20 ENCOUNTER — Ambulatory Visit: Payer: Managed Care, Other (non HMO) | Admitting: Family Medicine

## 2017-12-20 VITALS — BP 110/70 | HR 80 | Temp 97.8°F | Resp 16 | Ht 62.0 in | Wt 130.2 lb

## 2017-12-20 DIAGNOSIS — J069 Acute upper respiratory infection, unspecified: Secondary | ICD-10-CM | POA: Diagnosis not present

## 2017-12-20 DIAGNOSIS — J029 Acute pharyngitis, unspecified: Secondary | ICD-10-CM | POA: Diagnosis not present

## 2017-12-20 LAB — POCT RAPID STREP A (OFFICE): RAPID STREP A SCREEN: NEGATIVE

## 2017-12-20 NOTE — Progress Notes (Signed)
Patient ID: Alicia [name], female    DOB: 21-Apr-1991, 27 y.o.   MRN: [id number]  Chief Complaint  Patient presents with  . Sore Throat  . Ear Pain  . Cough    Allergies Penicillins and Sulfa antibiotics  Subjective:   Alicia [name] is a 27 y.o. female who presents to [facility] today.  HPI 2 days of mild cough, sore throat, ear pain. Has gotten slightly worse. No fevers. No nausea or vomiting. Using OTC tylenol cold/max. No rhinorrhea. Stuffy and congested. No sick contacts except cousin who had a cough. Hearing ok. No abdmonial pain.  Denies any chest pain, shortness of breath.  No history of asthma.  No wheezing.  Throat feels better with cold or warm liquids.  No swelling or lesions in her mouth or throat.   Sore Throat   This is a new problem. The current episode started in the past 7 days. The problem has been waxing and waning. Neither side of throat is experiencing more pain than the other. There has been no fever. The pain is at a severity of 5/10. The pain is mild. Associated symptoms include congestion, coughing, ear pain and a hoarse voice. Pertinent negatives include no abdominal pain, diarrhea, drooling, ear discharge, neck pain, shortness of breath, stridor, trouble swallowing or vomiting. She has had no exposure to strep or mono. She has tried cool liquids for the symptoms. The treatment provided mild relief.    Past Medical History:  Diagnosis Date  . Anemia   . Blood in urine 12/23/2015  . Contraceptive management 01/22/2015  . Heavy menstrual period 04/28/2014  . HSV-2 infection   . Urinary frequency 12/23/2015  . Urinary tract infection   . Vaginal discharge 12/31/2014  . Vaginal itching 12/31/2014  . Vaginal odor 12/03/2015  . Yeast infection 12/31/2014    Past Surgical History:  Procedure Laterality Date  . DILATION AND CURETTAGE OF UTERUS N/A 06/19/2015   Procedure: SUCTION AND SHARP UTERINE CURETTAGE;  Surgeon: Florian Buff, MD;  Location: [facility];  Service: Gynecology;  Laterality: N/A;  . LIPOMA EXCISION  07/2016   left eye  . NO PAST SURGERIES      Family History  Problem Relation Age of Onset  . Hypertension Mother   . Anemia Mother   . Cancer Maternal Grandmother        breast  . Diabetes Father   . Heart attack Paternal Grandfather      Social History   Socioeconomic History  . Marital status: Single    Spouse name: None  . Number of children: None  . Years of education: None  . Highest education level: None  Social Needs  . Financial resource strain: None  . Food insecurity - worry: None  . Food insecurity - inability: None  . Transportation needs - medical: None  . Transportation needs - non-medical: None  Occupational History  . None  Tobacco Use  . Smoking status: Former Smoker    Packs/day: 0.50    Years: 2.00    Pack years: 1.00    Types: Cigarettes  . Smokeless tobacco: Never Used  Substance and Sexual Activity  . Alcohol use: No  . Drug use: No    Comment: prior to pregnancy  . Sexual activity: Not Currently  Other Topics Concern  . None  Social History Narrative   Single. Not dating. Family lives in [place], [place].   Works as Electrical engineer.   Son, age  3, Alicia [name].   Eats all food groups.   Wear seatbelt.   Christian.            Review of Systems  HENT: Positive for congestion, ear pain and hoarse voice. Negative for drooling, ear discharge and trouble swallowing.   Respiratory: Positive for cough. Negative for shortness of breath and stridor.   Gastrointestinal: Negative for abdominal pain, diarrhea and vomiting.  Musculoskeletal: Negative for neck pain.     Objective:   BP 110/70 (BP Location: Left Arm, Patient Position: Sitting, Cuff Size: Normal)   Pulse 80   Temp 97.8 F (36.6 C) (Temporal)   Resp 16   Ht 5\' 2"  (1.575 m)   Wt 130 lb 4 oz (59.1 kg)   LMP 11/29/2017 (Approximate)   SpO2 99%   BMI 23.82 kg/m   Physical Exam  Constitutional: She is oriented to person,  place, and time. She appears well-developed and well-nourished. No distress.  HENT:  Head: Normocephalic and atraumatic.  Right Ear: Tympanic membrane and ear canal normal. No drainage or swelling.  Left Ear: Tympanic membrane and ear canal normal. No drainage or swelling.  No middle ear effusion.  Mouth/Throat: Uvula is midline and mucous membranes are normal. Mucous membranes are not pale, not dry and not cyanotic. Posterior oropharyngeal erythema present. No oropharyngeal exudate or posterior oropharyngeal edema. Tonsils are 1+ on the right. Tonsils are 1+ on the left. No tonsillar exudate.  Eyes: EOM are normal. Pupils are equal, round, and reactive to light.  Neck: Normal range of motion. Neck supple. No thyromegaly present.  Cardiovascular: Normal rate, regular rhythm and normal heart sounds.  Pulmonary/Chest: Effort normal and breath sounds normal. No respiratory distress. She has no wheezes.  Abdominal: Soft. Bowel sounds are normal. She exhibits no distension.  Neurological: She is alert and oriented to person, place, and time.  Skin: Skin is warm and dry. Capillary refill takes less than 2 seconds. No erythema.  Psychiatric: She has a normal mood and affect. Her behavior is normal.  Vitals reviewed.    Assessment and Plan  1. Pharyngitis, unspecified etiology, viral  - POCT rapid strep A, negative  2. Viral URI Supportive care discussed with patient. Discussed could use over-the-counter Chloraseptic throat spray.   Tylenol or Advil over-the-counter as directed. Continue good fluid intake. Discussed with patient that her symptoms were viral and did not have a need for antibiotic at this time.  She was told to call with any questions, concerns, or worrisome signs and symptoms.  She was given a note to be out of work for today and tomorrow. -Discussed with patient that her ears were normal with no evidence of infection today and suspect that her ear pain is due to referred pain  from her throat. Return if symptoms worsen or fail to improve. Caren Macadam, MD 12/20/2017

## 2017-12-27 ENCOUNTER — Other Ambulatory Visit: Payer: Self-pay

## 2017-12-27 ENCOUNTER — Other Ambulatory Visit (HOSPITAL_COMMUNITY)
Admission: RE | Admit: 2017-12-27 | Discharge: 2017-12-27 | Disposition: A | Discharge status: Home / Self Care | Payer: Managed Care, Other (non HMO) | Source: Ambulatory Visit | Attending: Family Medicine | Admitting: Family Medicine

## 2017-12-27 ENCOUNTER — Ambulatory Visit (INDEPENDENT_AMBULATORY_CARE_PROVIDER_SITE_OTHER): Payer: Managed Care, Other (non HMO) | Admitting: Family Medicine

## 2017-12-27 ENCOUNTER — Encounter: Payer: Self-pay | Admitting: Family Medicine

## 2017-12-27 VITALS — BP 112/74 | HR 72 | Temp 97.9°F | Resp 16 | Ht 62.0 in | Wt 132.8 lb

## 2017-12-27 DIAGNOSIS — R1013 Epigastric pain: Secondary | ICD-10-CM

## 2017-12-27 DIAGNOSIS — Z113 Encounter for screening for infections with a predominantly sexual mode of transmission: Secondary | ICD-10-CM | POA: Insufficient documentation

## 2017-12-27 DIAGNOSIS — Z0000 Encounter for general adult medical examination without abnormal findings: Secondary | ICD-10-CM

## 2017-12-27 MED ORDER — ESOMEPRAZOLE MAGNESIUM 20 MG PO CPDR: 20.0000 mg | DELAYED_RELEASE_CAPSULE | Freq: Every day | ORAL | 0 refills | Status: DC

## 2017-12-27 NOTE — Progress Notes (Signed)
Patient ID: Alicia [name], female    DOB: [birth year], 27 y.o.   MRN: [id number]  Chief Complaint  Patient presents with  . Annual Exam    Allergies Penicillins and Sulfa antibiotics  Subjective:   Alicia [name] is a 27 y.o. female who presents to [facility] today.  HPI Alicia [name] presents today for her complete physical examination.  She reports that she has been doing well.  She was recently diagnosed with an upper respiratory infection and her symptoms have resolved.  She was seen back several months ago for some epigastric pain and placed on a PPI.  She reports that she took the medication for over a month and during that time she did not have the pain in her stomach area.  She reports that upon discontinuation of the omeprazole that her symptoms returned.  She reports that she gets a crampy hurting pain in her stomach area that occurs after eating.  She denies any nausea or vomiting.  Is not currently in pain at this time.  Does report belching and burping.  Bowel movements are normal.  Denies any melena or bright red blood per rectum.  She also reports that she is concerned about the previous diagnosis that she has received of HSV 2 infection.  She reports that a screening test was done when she was pregnant and the test came back slightly elevated.  She was told she had herpes.  She was not placed on prophylactic medication during her pregnancy.  She denies ever having any oral or genital lesions or outbreaks.  She reports that this diagnosis is greatly stressed her out and is kept her from being in any type of relationship because she is embarrassed and humiliated to tell this or disclose this information to anyone.  She reports that she does not believe the diagnosis because she has never had an outbreak.  She reports she has never been treated for any sexually transmitted diseases.  She has not been sexually active in a very long time.  She denies any abnormal vaginal  discharge.  She reports that she feels well.  She denies any fatigue.  She reports her mood is good.  She denies any feelings of down, depressed, or hopelessness.  She is not in a relationship at this time and is not dating.  She has not had her vision exam checked in over 5 years.  She has not been routinely going to the dentist despite dental insurance.  She does not exercise on a regular basis.  She tries to eat a fairly healthy diet.  She does not have any concerns about her health other than the epigastric pain that she experiences upon eating.    Past Medical History:  Diagnosis Date  . Anemia   . Blood in urine 12/23/2015  . Contraceptive management 01/22/2015  . Heavy menstrual period 04/28/2014  . HSV-2 infection   . Urinary frequency 12/23/2015  . Urinary tract infection   . Vaginal discharge 12/31/2014  . Vaginal itching 12/31/2014  . Vaginal odor 12/03/2015  . Yeast infection 12/31/2014    Past Surgical History:  Procedure Laterality Date  . DILATION AND CURETTAGE OF UTERUS N/A 06/19/2015   Procedure: SUCTION AND SHARP UTERINE CURETTAGE;  Surgeon: Florian Buff, MD;  Location: [facility];  Service: Gynecology;  Laterality: N/A;  . LIPOMA EXCISION  07/2016   left eye  . NO PAST SURGERIES      Family History  Problem Relation  Age of Onset  . Hypertension Mother   . Anemia Mother   . Cancer Maternal Grandmother        breast  . Diabetes Father   . Heart attack Paternal Grandfather      Social History   Socioeconomic History  . Marital status: Single    Spouse name: None  . Number of children: None  . Years of education: None  . Highest education level: None  Social Needs  . Financial resource strain: None  . Food insecurity - worry: None  . Food insecurity - inability: None  . Transportation needs - medical: None  . Transportation needs - non-medical: None  Occupational History  . None  Tobacco Use  . Smoking status: Former Smoker    Packs/day: 0.50    Years: 2.00     Pack years: 1.00    Types: Cigarettes  . Smokeless tobacco: Never Used  Substance and Sexual Activity  . Alcohol use: No  . Drug use: No    Comment: prior to pregnancy  . Sexual activity: Not Currently  Other Topics Concern  . None  Social History Narrative   Single. Not dating. Family lives in [place], [place].   Works as Electrical engineer.   Son, age 46, Lysbeth Galas.   Eats all food groups.   Wear seatbelt.   Christian.            Review of Systems  Constitutional: Negative for activity change, appetite change and fever.  HENT: Negative for congestion, hearing loss, rhinorrhea, sore throat, trouble swallowing and voice change.   Eyes: Negative for visual disturbance.  Respiratory: Negative for cough, chest tightness and shortness of breath.   Cardiovascular: Negative for chest pain, palpitations and leg swelling.  Gastrointestinal: Positive for abdominal pain. Negative for abdominal distention, anal bleeding, blood in stool, constipation, diarrhea, nausea and vomiting.       No current abdominal pain today.  Genitourinary: Negative for dysuria, frequency and urgency.  Musculoskeletal: Negative for back pain, myalgias and neck stiffness.  Skin: Negative for rash.  Neurological: Negative for dizziness, seizures, syncope, light-headedness and headaches.  Hematological: Negative for adenopathy.  Psychiatric/Behavioral: Negative for dysphoric mood, sleep disturbance and suicidal ideas. The patient is not nervous/anxious.      Objective:   BP 112/74 (BP Location: Left Arm, Patient Position: Sitting, Cuff Size: Normal)   Pulse 72   Temp 97.9 F (36.6 C) (Temporal)   Resp 16   Ht 5\' 2"  (1.575 m)   Wt 132 lb 12 oz (60.2 kg)   LMP 11/29/2017 (Approximate)   SpO2 100%   BMI 24.28 kg/m   Physical Exam  Constitutional: She is oriented to person, place, and time. She appears well-developed and well-nourished. No distress.  HENT:  Head: Normocephalic and atraumatic.  Right Ear: External ear  normal.  Left Ear: External ear normal.  Nose: Nose normal.  Mouth/Throat: Oropharynx is clear and moist. No oropharyngeal exudate.  Eyes: Conjunctivae and EOM are normal. Pupils are equal, round, and reactive to light. Right eye exhibits no discharge. Left eye exhibits no discharge. No scleral icterus.  Neck: Normal range of motion. Neck supple. No JVD present. No tracheal deviation present. No thyromegaly present.  Cardiovascular: Normal rate, regular rhythm, normal heart sounds and intact distal pulses. Exam reveals no gallop and no friction rub.  No murmur heard. Pulmonary/Chest: Effort normal and breath sounds normal. No stridor. No respiratory distress. She has no wheezes.  Abdominal: Soft. Bowel sounds are normal. She exhibits  no distension and no mass. There is no tenderness. There is no rebound and no guarding. No hernia.  Musculoskeletal: Normal range of motion. She exhibits no edema.  Lymphadenopathy:    She has no cervical adenopathy.  Neurological: She is alert and oriented to person, place, and time. No cranial nerve deficit.  Skin: Skin is warm and dry. Capillary refill takes less than 2 seconds. No rash noted.  Psychiatric: She has a normal mood and affect. Her behavior is normal. Judgment and thought content normal.  Nursing note and vitals reviewed.  Depression screen PHQ 2/9 11/02/2017  Decreased Interest 0  Down, Depressed, Hopeless 0  PHQ - 2 Score 0     Assessment and Plan  1. Well adult exam Age-appropriate anticipatory guidance given and discussed with patient today. Exercise recommendations given. BMI was discussed.  Her BMI is on the borderline of becoming overweight.  We did discuss healthy diet and lifestyle recommendations. Vision exam recommended. Dental visits every 6 months recommended. - Basic metabolic panel - Lipid panel -Pap smear is up-to-date. 2. Screen for STD (sexually transmitted disease) Today we did discuss her prior lab testing which was  slightly positive for HSV-2.  She has no history of documented outbreaks and was not on suppressive therapy during her pregnancy.  This diagnosis has been somewhat traumatic to her and is caused her to not be in a relationship over the past several years. We did discuss that in general that screening test for herpes without a prior outbreak is not recommended.  We did discuss the high percentage of false positive results.  We discussed that in light of the fact that she has never had an outbreak, pain in her genitals, any oral lesions.  That it is a possibility that she has a false positive test and does not have a true diagnosis of herpes.  We discussed that if she does get an outbreak that she should follow-up in our office and we could do viral testing of the lesion at that time.  She reports she would do that if it is ever the case.  We did discuss that this does not mean she should be sexually active without condoms.  She voiced understanding.  Safe sex and protection against sexually transmitted diseases was discussed today. Will screen for any other sexually transmitted diseases today. - HIV antibody - RPR - Urine cytology ancillary only  3. Dyspepsia Patient with recurrent dyspepsia status post treatment with PPI.  H. pylori testing was negative.  Will refill Nexium at this time and refer to GI for evaluation and possible possible endoscopy. Continue reflux precautions. Patient was counseled regarding worrisome signs and symptoms of abdominal pain and if they develop to go to the emergency department or seek medical help.  She voiced understanding - Ambulatory referral to Gastroenterology - esomeprazole (NEXIUM) 20 MG capsule; Take 1 capsule (20 mg total) by mouth daily.  Dispense: 30 capsule; Refill: 0  Return in about 1 year (around 12/28/2018), or if symptoms worsen or fail to improve. Caren Macadam, MD 12/27/2017

## 2017-12-28 LAB — URINE CYTOLOGY ANCILLARY ONLY
Chlamydia: NEGATIVE
Neisseria Gonorrhea: NEGATIVE
Trichomonas: NEGATIVE

## 2017-12-30 LAB — URINE CYTOLOGY ANCILLARY ONLY: Candida vaginitis: NEGATIVE

## 2018-01-01 ENCOUNTER — Encounter: Payer: Self-pay | Admitting: Gastroenterology

## 2018-01-02 ENCOUNTER — Telehealth: Payer: Self-pay | Admitting: Family Medicine

## 2018-01-02 MED ORDER — METRONIDAZOLE 500 MG PO TABS: 500.0000 mg | ORAL_TABLET | Freq: Two times a day (BID) | ORAL | 0 refills | Status: DC

## 2018-01-02 NOTE — Telephone Encounter (Signed)
Please call and advise patient that [name] urine testing was negative for gonorrhea, chlamydia, and Trichomonas.  Advised [name] that [name] testing was positive for bacterial vaginosis.  This is not a sexually transmitted infection, however it is an overgrowth of bacteria in the vagina that can cause vaginal discharge and odor.  Please advise patient she will need to take metronidazole 500 mg, 1 twice a day for 7 days.  Advised that she cannot drink alcohol while taking this medicine or within 3 days of completing the medication or it will make [name] very sick.  Also, advised [name] that I did not get [name] blood work back from the lab.  Did she have the testing done?  Advised that medication was sent into [facility] in [place]. Alicia [name]. Mannie Stabile, MD

## 2018-01-03 ENCOUNTER — Telehealth: Payer: Self-pay | Admitting: *Deleted

## 2018-01-03 NOTE — Telephone Encounter (Signed)
Informed patient [facility] has already seen patient and stated she does have BV and prescription was sent to pharmacy. Advised patient to call their office to get all of the information.

## 2018-01-03 NOTE — Telephone Encounter (Signed)
Called patient regarding message below. No answer, unable to leave message.  

## 2018-01-04 ENCOUNTER — Encounter: Payer: Self-pay | Admitting: Family Medicine

## 2018-01-04 ENCOUNTER — Telehealth: Payer: Self-pay | Admitting: Family Medicine

## 2018-01-04 MED ORDER — METRONIDAZOLE 0.75 % VA GEL: 1.0000 | Freq: Every day | VAGINAL | 0 refills | Status: DC

## 2018-01-04 NOTE — Telephone Encounter (Signed)
Patient was informed yesterday. Epic was down and I was unable to document.

## 2018-01-04 NOTE — Telephone Encounter (Signed)
Patient stopped by the office stating she is unable to take metronidazole- she states she can not keep it down. She states she has been through this with [facility] and they have given her a gel in the past instead. Please advise.

## 2018-01-04 NOTE — Telephone Encounter (Signed)
Patient informed of message below, verbalized understanding.  

## 2018-01-04 NOTE — Telephone Encounter (Signed)
Please advised that I sent a prescription to Alicia [name].  She should use 1 5 g applicatorful each night for the next 5 nights for treatment.  She should still refrain from alcohol use during this medication and it for 3 days after discontinuation of this medication.

## 2018-01-05 MED ORDER — METRONIDAZOLE 0.75 % VA GEL: 1.0000 | Freq: Every day | VAGINAL | 0 refills | Status: DC

## 2018-02-14 ENCOUNTER — Telehealth: Payer: Self-pay | Admitting: Obstetrics & Gynecology

## 2018-02-14 NOTE — Telephone Encounter (Signed)
Spoke with pt. Pt was prescribed Metrogel for BV yesterday. She started her period today. She is passing clots. Not on birth control. Pt wonders if Metrogel could be causing this issue with period. I advised to call the dr that prescribed med. Pt voiced understanding. [place]

## 2018-02-16 ENCOUNTER — Ambulatory Visit: Payer: Managed Care, Other (non HMO) | Admitting: Gastroenterology

## 2018-03-07 ENCOUNTER — Telehealth: Payer: Self-pay | Admitting: Family Medicine

## 2018-03-07 ENCOUNTER — Encounter: Payer: Self-pay | Admitting: Family Medicine

## 2018-03-07 ENCOUNTER — Other Ambulatory Visit: Payer: Self-pay

## 2018-03-07 ENCOUNTER — Ambulatory Visit (INDEPENDENT_AMBULATORY_CARE_PROVIDER_SITE_OTHER): Payer: Managed Care, Other (non HMO) | Admitting: Family Medicine

## 2018-03-07 VITALS — BP 114/70 | HR 105 | Temp 98.5°F | Resp 18 | Ht 62.0 in | Wt 129.0 lb

## 2018-03-07 DIAGNOSIS — Z30013 Encounter for initial prescription of injectable contraceptive: Secondary | ICD-10-CM | POA: Diagnosis not present

## 2018-03-07 DIAGNOSIS — N3001 Acute cystitis with hematuria: Secondary | ICD-10-CM | POA: Diagnosis not present

## 2018-03-07 DIAGNOSIS — R309 Painful micturition, unspecified: Secondary | ICD-10-CM | POA: Diagnosis not present

## 2018-03-07 LAB — POCT URINALYSIS DIPSTICK
Glucose, UA: NEGATIVE
Ketones, UA: 15
Nitrite, UA: NEGATIVE
PH UA: 6 (ref 5.0–8.0)
Protein, UA: 100
Spec Grav, UA: >=1.03 — AB (ref 1.010–1.025)
UROBILINOGEN UA: 1 U/dL

## 2018-03-07 LAB — POCT URINE PREGNANCY: PREG TEST UR: NEGATIVE

## 2018-03-07 MED ORDER — CIPROFLOXACIN HCL 250 MG PO TABS
250.0000 mg | ORAL_TABLET | Freq: Two times a day (BID) | ORAL | 0 refills | Status: DC
Start: 2018-03-07 — End: 2018-06-07

## 2018-03-07 MED ORDER — MEDROXYPROGESTERONE ACETATE 150 MG/ML IM SUSP
150.0000 mg | Freq: Once | INTRAMUSCULAR | Status: AC
  Administered 2018-03-07: 150 mg via INTRAMUSCULAR

## 2018-03-07 NOTE — Telephone Encounter (Signed)
Patients son came in requesting an appt for hospital followup patient was discharged today. He said patient is requesting to be seen tomorrow. The discharge paper says to followup in 2 weeks. °You do not have the availability for a TOC tomorrow. °

## 2018-03-07 NOTE — Progress Notes (Signed)
Patient ID: Alicia [name], female    DOB: Mar 24, 1991, 27 y.o.   MRN: [id number]  Chief Complaint  Patient presents with  . Depo shot    also thinks she has a UTI    Allergies Penicillins and Sulfa antibiotics  Subjective:   Alicia [name] is a 27 y.o. female who presents to [facility] today.  HPI [name] presents to the office today because she would like to discuss initiation of Depo-Provera.  She is uses contraceptive method in the past for several years with no side effects.  She has been off of this for greater than 1 year.  She reports that this method of contraception worked well for her and it helped with her periods.  She would like to receive a Depo shot today.  She also reports that she has had burning with urination for the past 3 days.  She reports that she has burning which is worst at the end stream urination.  She reports she is seeing some blood in her urine.  She denies any vaginal discharge.  She denies any pelvic pain.  Denies nausea, vomiting, diarrhea.  She is not sexually active at this time.  Does not want to be checked for any sexually transmitted infections.  Reports that her menses are usually monthly.  Does not have any problem.  Desires contraception at this time.  She reports that when she was on this medication before she did not have any side effects.  She does not currently take calcium and vitamin D supplementation.  She does exercise.  She has no family history of GYN malignancy.  She has no history of any smoking or blood clots.Pap smear UTD.    Past Medical History:  Diagnosis Date  . Anemia   . Blood in urine 12/23/2015  . Contraceptive management 01/22/2015  . Heavy menstrual period 04/28/2014  . HSV-2 infection   . Urinary frequency 12/23/2015  . Urinary tract infection   . Vaginal discharge 12/31/2014  . Vaginal itching 12/31/2014  . Vaginal odor 12/03/2015  . Yeast infection 12/31/2014    Past Surgical History:  Procedure Laterality  Date  . DILATION AND CURETTAGE OF UTERUS N/A 06/19/2015   Procedure: SUCTION AND SHARP UTERINE CURETTAGE;  Surgeon: Florian Buff, MD;  Location: [facility];  Service: Gynecology;  Laterality: N/A;  . LIPOMA EXCISION  07/2016   left eye  . NO PAST SURGERIES      Family History  Problem Relation Age of Onset  . Hypertension Mother   . Anemia Mother   . Cancer Maternal Grandmother        breast  . Diabetes Father   . Heart attack Paternal Grandfather      Social History   Socioeconomic History  . Marital status: Single    Spouse name: Not on file  . Number of children: Not on file  . Years of education: Not on file  . Highest education level: Not on file  Occupational History  . Not on file  Social Needs  . Financial resource strain: Not on file  . Food insecurity:    Worry: Not on file    Inability: Not on file  . Transportation needs:    Medical: Not on file    Non-medical: Not on file  Tobacco Use  . Smoking status: Former Smoker    Packs/day: 0.50    Years: 2.00    Pack years: 1.00    Types: Cigarettes  .  Smokeless tobacco: Never Used  Substance and Sexual Activity  . Alcohol use: No  . Drug use: No    Types: Marijuana    Comment: prior to pregnancy  . Sexual activity: Not Currently  Lifestyle  . Physical activity:    Days per week: 0 days    Minutes per session: 0 min  . Stress: Not at all  Relationships  . Social connections:    Talks on phone: More than three times a week    Gets together: More than three times a week    Attends religious service: 1 to 4 times per year    Active member of club or organization: No    Attends meetings of clubs or organizations: Never    Relationship status: Never married  Other Topics Concern  . Not on file  Social History Narrative   Single. Not dating. Family lives in [place], [place].   Works as Electrical engineer.   Son, age 65, Alicia [name].   Eats all food groups.   Wear seatbelt.   Christian.            Review of Systems    Constitutional: Negative for activity change, appetite change and fever.  Eyes: Negative for visual disturbance.  Respiratory: Negative for cough, chest tightness and shortness of breath.   Cardiovascular: Negative for chest pain, palpitations and leg swelling.  Gastrointestinal: Negative for abdominal pain, nausea and vomiting.  Genitourinary: Positive for hematuria and urgency. Negative for decreased urine volume, difficulty urinating, dyspareunia, dysuria, frequency, genital sores, vaginal bleeding, vaginal discharge and vaginal pain.       She believes that she did see some blood on the toilet tissue when she wiped.  She reports she does have some urinary urgency, frequency, and dysuria.  No pelvic pain.  No vaginal discharge.  No abnormal vaginal bleeding.  She does not have any urinary hesitancy.  Denies any vaginal pain.   Skin: Negative for rash.  Neurological: Negative for dizziness, syncope, light-headedness and headaches.  Hematological: Negative for adenopathy.  Psychiatric/Behavioral: Negative for dysphoric mood, sleep disturbance and suicidal ideas. The patient is not nervous/anxious.      Objective:   BP 114/70 (BP Location: Left Arm, Patient Position: Sitting, Cuff Size: Normal)   Pulse (!) 105   Temp 98.5 F (36.9 C) (Oral)   Resp 18   Ht 5\' 2"  (1.575 m)   Wt 129 lb (58.5 kg)   LMP 02/14/2018   SpO2 99%   BMI 23.59 kg/m   Physical Exam  Constitutional: She is oriented to person, place, and time. She appears well-developed and well-nourished. No distress.  HENT:  Head: Normocephalic and atraumatic.  Eyes: Pupils are equal, round, and reactive to light.  Neck: Normal range of motion. Neck supple. No thyromegaly present.  Cardiovascular: Normal rate, regular rhythm and normal heart sounds.  Pulmonary/Chest: Effort normal and breath sounds normal. No respiratory distress.  Abdominal: Soft. Bowel sounds are normal. There is tenderness in the suprapubic area. There is  no CVA tenderness.  Neurological: She is alert and oriented to person, place, and time. No cranial nerve deficit.  Skin: Skin is warm and dry.  Nursing note and vitals reviewed.    Assessment and Plan  1. Painful urination, secondary to UTI - ciprofloxacin (CIPRO) 250 MG tablet; Take 1 tablet (250 mg total) by mouth 2 (two) times daily.  Dispense: 6 tablet; Refill: 0 Await culture and sensitivity.  Patient counseled in detail regarding the risks of medication. Told to  call or return to clinic if develop any worrisome signs or symptoms. Patient voiced understanding.  Patient understands risk of Cipro.  She has limited antibiotic choices secondary to allergies to penicillin and sulfa.  Patient was counseled concerning worrisome signs and symptoms of UTI.  If those develop she will call, return to clinic or go to the emergency department. - POCT urinalysis dipstick - Urine Culture -follow up for repeat urine in 1 month or sooner if not better.  2. Initiation of Depo Provera Risks versus benefits of Depo-Provera were discussed with patient.  She understands the risks of this medication including, but not limited to abnormal uterine bleeding, decreased bone mineral density, possible mood side effects.  She was given injection today.  She understands when to return to get her next shot.  She is told to call with any questions or concerns.  She was counseled regarding this medication today.  She understands that this medication does decreases her chances of pregnancy but does not protect her against any sexually transmitted infections.  She voiced understanding.  She defers testing for any sexually transmitted infections at this time. - POCT urine pregnancy - medroxyPROGESTERone (DEPO-PROVERA) injection 150 mg -Consider BMD. -calcium and vitamin D doses recommended.   Return in about 1 month (around 04/04/2018). Caren Macadam, MD 03/07/2018

## 2018-03-07 NOTE — Telephone Encounter (Signed)
How old is patient's son? Is he an established patient in our practice?

## 2018-03-07 NOTE — Patient Instructions (Signed)
  Calcium 600 mg twice a day Vitamin D 1000 IU a day

## 2018-03-08 ENCOUNTER — Other Ambulatory Visit (HOSPITAL_COMMUNITY)
Admission: RE | Admit: 2018-03-08 | Discharge: 2018-03-08 | Disposition: A | Discharge status: Home / Self Care | Payer: Managed Care, Other (non HMO) | Source: Other Acute Inpatient Hospital | Attending: Family Medicine | Admitting: Family Medicine

## 2018-03-08 DIAGNOSIS — R309 Painful micturition, unspecified: Secondary | ICD-10-CM | POA: Insufficient documentation

## 2018-03-08 NOTE — Telephone Encounter (Signed)
Disregard previous message this was sent in the wrong chart. Sorry.

## 2018-03-09 ENCOUNTER — Encounter: Payer: Self-pay | Admitting: Family Medicine

## 2018-03-09 MED ORDER — FLUCONAZOLE 150 MG PO TABS: ORAL_TABLET | ORAL | 0 refills | Status: DC

## 2018-03-10 LAB — URINE CULTURE

## 2018-03-30 ENCOUNTER — Encounter: Payer: Self-pay | Admitting: Family Medicine

## 2018-06-07 ENCOUNTER — Encounter: Payer: Self-pay | Admitting: Family Medicine

## 2018-06-07 ENCOUNTER — Ambulatory Visit (INDEPENDENT_AMBULATORY_CARE_PROVIDER_SITE_OTHER): Payer: Managed Care, Other (non HMO) | Admitting: Family Medicine

## 2018-06-07 VITALS — BP 118/82 | HR 78 | Ht 62.0 in | Wt 133.0 lb

## 2018-06-07 DIAGNOSIS — Z3042 Encounter for surveillance of injectable contraceptive: Secondary | ICD-10-CM

## 2018-06-07 DIAGNOSIS — Z3200 Encounter for pregnancy test, result unknown: Secondary | ICD-10-CM | POA: Diagnosis not present

## 2018-06-07 LAB — POCT URINE PREGNANCY: PREG TEST UR: NEGATIVE

## 2018-06-07 MED ORDER — MEDROXYPROGESTERONE ACETATE 150 MG/ML IM SUSP
150.0000 mg | Freq: Once | INTRAMUSCULAR | Status: AC
  Administered 2018-06-07: 150 mg via INTRAMUSCULAR

## 2018-06-07 NOTE — Progress Notes (Signed)
Patient ID: Alicia [name], female    DOB: Jan 20, 1991, 27 y.o.   MRN: [id number]  Chief Complaint  Patient presents with  . Injections    Allergies Penicillins and Sulfa antibiotics  Subjective:   Alicia [name] is a 27 y.o. female who presents to [facility] today.  HPI Here for depo shot. Has not had any problems. Has been on depo shot since 12/2016. No bleeding or problems. No side effects. She is followed by Dr. Elonda Husky for gynecologist needs.    Past Medical History:  Diagnosis Date  . Anemia   . Blood in urine 12/23/2015  . Contraceptive management 01/22/2015  . Heavy menstrual period 04/28/2014  . HSV-2 infection   . Urinary frequency 12/23/2015  . Urinary tract infection   . Vaginal discharge 12/31/2014  . Vaginal itching 12/31/2014  . Vaginal odor 12/03/2015  . Yeast infection 12/31/2014    Past Surgical History:  Procedure Laterality Date  . DILATION AND CURETTAGE OF UTERUS N/A 06/19/2015   Procedure: SUCTION AND SHARP UTERINE CURETTAGE;  Surgeon: Florian Buff, MD;  Location: [facility];  Service: Gynecology;  Laterality: N/A;  . LIPOMA EXCISION  07/2016   left eye  . NO PAST SURGERIES      Family History  Problem Relation Age of Onset  . Hypertension Mother   . Anemia Mother   . Cancer Maternal Grandmother        breast  . Diabetes Father   . Heart attack Paternal Grandfather      Social History   Socioeconomic History  . Marital status: Single    Spouse name: Not on file  . Number of children: Not on file  . Years of education: Not on file  . Highest education level: Not on file  Occupational History  . Not on file  Social Needs  . Financial resource strain: Not on file  . Food insecurity:    Worry: Not on file    Inability: Not on file  . Transportation needs:    Medical: Not on file    Non-medical: Not on file  Tobacco Use  . Smoking status: Former Smoker    Packs/day: 0.50    Years: 2.00    Pack years: 1.00    Types: Cigarettes    . Smokeless tobacco: Never Used  Substance and Sexual Activity  . Alcohol use: No  . Drug use: No    Types: Marijuana    Comment: prior to pregnancy  . Sexual activity: Not Currently  Lifestyle  . Physical activity:    Days per week: 0 days    Minutes per session: 0 min  . Stress: Not at all  Relationships  . Social connections:    Talks on phone: More than three times a week    Gets together: More than three times a week    Attends religious service: 1 to 4 times per year    Active member of club or organization: No    Attends meetings of clubs or organizations: Never    Relationship status: Never married  Other Topics Concern  . Not on file  Social History Narrative   Single. Not dating. Family lives in [place], [place].   Works as Electrical engineer.   Son, age 76, Lysbeth Galas.   Eats all food groups.   Wear seatbelt.   Christian.            Review of Systems  Constitutional: Negative for activity change, appetite change  and fever.  Eyes: Negative for visual disturbance.  Respiratory: Negative for cough, chest tightness and shortness of breath.   Cardiovascular: Negative for chest pain, palpitations and leg swelling.  Genitourinary: Positive for pelvic pain. Negative for difficulty urinating, dysuria, flank pain, frequency, menstrual problem, urgency, vaginal bleeding, vaginal discharge and vaginal pain.  Skin: Negative for rash.  Neurological: Negative for dizziness, syncope and light-headedness.  Hematological: Negative for adenopathy.  Psychiatric/Behavioral: Negative for sleep disturbance. The patient is not nervous/anxious.      Objective:   BP 118/82   Pulse 78   Ht 5\' 2"  (1.575 m)   Wt 133 lb (60.3 kg)   LMP 04/22/2018   SpO2 99%   BMI 24.33 kg/m   Physical Exam  Constitutional: She appears well-developed and well-nourished.  Cardiovascular: Normal rate, regular rhythm and normal heart sounds.  Pulmonary/Chest: Effort normal and breath sounds normal.  Skin:  Capillary refill takes less than 2 seconds.  Psychiatric: She has a normal mood and affect. Her behavior is normal.  Vitals reviewed.   POCT hcg negative.  Assessment and Plan   1. Encounter for surveillance of injectable contraceptive Stable. Risks vs. Benefits of Depo discussed with patient. Patient counseled in detail regarding the risks of medication. Told to call or return to clinic if develop any worrisome signs or symptoms. Patient voiced understanding.  Start MVI po qd.   Follow up with gynecology for next shot. Counseled and given dates for next shot. Safe sexual practices discussed.  No follow-ups on file. Caren Macadam, MD 06/07/2018

## 2018-06-07 NOTE — Patient Instructions (Signed)

## 2018-11-27 ENCOUNTER — Telehealth: Payer: Self-pay | Admitting: Obstetrics & Gynecology

## 2018-11-27 NOTE — Telephone Encounter (Signed)
Patient called stating that she has a question for a nurse regarding her cervix's please contact pt.

## 2018-11-27 NOTE — Telephone Encounter (Signed)
Pt called asking how the cervix will be when someone first becomes pregnant.  Advised that the cervix should be closed, dilation does not typically occur until near end of pregnancy. Pt is asking if there is a way to tell if she is pregnant by feeling her cervix. Advised that the best way to determine pregnancy is by doing a pregnancy test. Pt verbalized understanding.

## 2019-10-15 ENCOUNTER — Other Ambulatory Visit: Payer: Self-pay

## 2019-10-15 ENCOUNTER — Ambulatory Visit: Payer: HRSA Program | Attending: Internal Medicine

## 2019-10-15 DIAGNOSIS — Z20822 Contact with and (suspected) exposure to covid-19: Secondary | ICD-10-CM

## 2019-10-15 DIAGNOSIS — Z20828 Contact with and (suspected) exposure to other viral communicable diseases: Secondary | ICD-10-CM | POA: Diagnosis present

## 2019-10-17 LAB — NOVEL CORONAVIRUS, NAA: SARS-CoV-2, NAA: NOT DETECTED

## 2019-12-01 ENCOUNTER — Encounter (HOSPITAL_COMMUNITY): Payer: Self-pay

## 2019-12-01 ENCOUNTER — Emergency Department (HOSPITAL_COMMUNITY)
Admission: EM | Admit: 2019-12-01 | Discharge: 2019-12-01 | Disposition: A | Discharge status: Home / Self Care | Payer: Self-pay | Attending: Emergency Medicine | Admitting: Emergency Medicine

## 2019-12-01 ENCOUNTER — Other Ambulatory Visit: Payer: Self-pay

## 2019-12-01 ENCOUNTER — Emergency Department (HOSPITAL_COMMUNITY): Payer: Self-pay

## 2019-12-01 DIAGNOSIS — S93402A Sprain of unspecified ligament of left ankle, initial encounter: Secondary | ICD-10-CM | POA: Diagnosis present

## 2019-12-01 DIAGNOSIS — M25572 Pain in left ankle and joints of left foot: Secondary | ICD-10-CM | POA: Insufficient documentation

## 2019-12-01 DIAGNOSIS — Z87891 Personal history of nicotine dependence: Secondary | ICD-10-CM | POA: Insufficient documentation

## 2019-12-01 MED ORDER — IBUPROFEN 600 MG PO TABS: 600.0000 mg | ORAL_TABLET | Freq: Three times a day (TID) | ORAL | 0 refills | Status: DC | PRN

## 2019-12-01 NOTE — ED Notes (Signed)
Pt reports medial ankle and achilles area pain for three days   Works at [employer] on feet all day   Painful when extending foot to aforementioned area  Slight pain reported when flexing foot   Sensation intact   Reports feels better when wrapped and has wrapped in coban

## 2019-12-01 NOTE — Discharge Instructions (Addendum)
Your xrays are normal with no sign of bony injury or dislocation.  Your exam suggests an ankle sprain given the location of your pain.  Wear the splint given to protect your ankle.  I encourage elevation and ice as much as is comfortable until the swelling is resolved.  After the swelling improves, you may use heating pad or warm water soak for 15 minutes several times daily.  Plan to call the orthopedist listed if not improving with this treatment plan.  Increase your ibuprofen as prescribed.  Make sure to continue taking your omeprazole while on this medicine.

## 2019-12-01 NOTE — ED Triage Notes (Signed)
Pt presents to ED with complaints of left ankle pain which started 3 days ago. Pt denies known injury.

## 2019-12-01 NOTE — ED Provider Notes (Signed)
[facility] EMERGENCY DEPARTMENT Provider Note   CSN: [id number] Arrival date & time: 12/01/19  1059     History Chief Complaint  Patient presents with  . Ankle Pain    Alicia [name] is a 29 y.o. female presenting with left ankle pain which started approximately 3 days ago.  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 There is no radiation of pain.  She has taken ibuprofen 200 mg without symptom relief. Has also used elevation and rest without improvement.  No fevers, chills, rash. No h/o gout.    HPI     Past Medical History:  Diagnosis Date  . Anemia   . Blood in urine 12/23/2015  . Contraceptive management 01/22/2015  . Heavy menstrual period 04/28/2014  . HSV-2 infection   . Urinary frequency 12/23/2015  . Urinary tract infection   . Vaginal discharge 12/31/2014  . Vaginal itching 12/31/2014  . Vaginal odor 12/03/2015  . Yeast infection 12/31/2014    Patient Active Problem List   Diagnosis Date Noted  . Sprain of left ankle 12/01/2019  . Blood in urine 12/23/2015  . Yeast infection 12/31/2014  . BV (bacterial vaginosis) 12/01/2014  . 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 DILATION AND CURETTAGE OF UTERUS N/A 06/19/2015   Procedure: SUCTION AND SHARP UTERINE CURETTAGE;  Surgeon: Florian Buff, MD;  Location: [facility];  Service: Gynecology;  Laterality: N/A;  . LIPOMA EXCISION  07/2016   left eye  . NO PAST SURGERIES       OB History    Gravida  2   Para  1   Term  1   Preterm  0   AB  1   Living  1     SAB  0   TAB  1   Ectopic  0   Multiple      Live Births  1        Obstetric Comments  TAB 16 weeks        Family History  Problem Relation Age of Onset  . Hypertension Mother   . Anemia Mother   . Cancer Maternal Grandmother        breast  . Diabetes Father   . Heart  attack Paternal Grandfather     Social History   Tobacco Use  . Smoking status: Former Smoker    Packs/day: 0.50    Years: 2.00    Pack years: 1.00    Types: Cigarettes  . Smokeless tobacco: Never Used  Substance Use Topics  . Alcohol use: No  . Drug use: No    Types: Marijuana    Comment: prior to pregnancy    Home Medications Prior to Admission medications   Medication Sig Start Date End Date Taking? Authorizing Provider  ibuprofen (ADVIL) 600 MG tablet Take 1 tablet (600 mg total) by mouth every 8 (eight) hours as needed. 12/01/19   Evalee Jefferson, PA-C    Allergies    Penicillins and Sulfa antibiotics  Review of Systems   Review of Systems  Musculoskeletal: Positive for arthralgias and joint swelling.  Skin: Negative for wound.  Neurological: Negative for weakness and numbness.    Physical Exam Updated Vital Signs BP 113/75 (BP Location: Right Arm)   Pulse 96   Temp 97.8 F (36.6 C) (Oral)   Resp  18   Ht 5\' 2"  (1.575 m)   Wt 63.5 kg   LMP 11/29/2019   SpO2 96%   BMI 25.61 kg/m   Physical Exam Vitals and nursing note reviewed.  Constitutional:      Appearance: She is well-developed.  HENT:     Head: Normocephalic.  Cardiovascular:     Rate and Rhythm: Normal rate.     Pulses: No decreased pulses.          Dorsalis pedis pulses are 2+ on the left side.       Posterior tibial pulses are 2+ on the left side.  Musculoskeletal:        General: Swelling and tenderness present.     Right ankle: Swelling present. No ecchymosis. Tenderness present over the lateral malleolus. No proximal fibula tenderness. Decreased range of motion. Normal pulse.     Right Achilles Tendon: Normal.     Comments: ttp left lateral malleolus.  No increased warmth or erythema.    Skin:    General: Skin is warm and dry.  Neurological:     Mental Status: She is alert.     Sensory: No sensory deficit.     ED Results / Procedures / Treatments   Labs (all labs ordered are listed,  but only abnormal results are displayed) Labs Reviewed - No data to display  EKG None  Radiology DG Ankle Complete Left  Result Date: 12/01/2019 CLINICAL DATA:  Left ankle pain, no known injury EXAM: LEFT ANKLE COMPLETE - 3+ VIEW COMPARISON:  None. FINDINGS: There is no evidence of fracture, dislocation, or joint effusion. There is no evidence of arthropathy or other focal bone abnormality. Soft tissues are unremarkable. IMPRESSION: No fracture or dislocation of the left ankle. No radiographic findings to explain pain. Electronically Signed   By: Eddie Candle M.D.   On: 12/01/2019 11:31    Procedures Procedures (including critical care time)  Medications Ordered in ED Medications - No data to display  ED Course  I have reviewed the triage vital signs and the nursing notes.  Pertinent labs & imaging results that were available during my care of the patient were reviewed by me and considered in my medical decision making (see chart for details).    MDM Rules/Calculators/A&P                      Exam and imaging reassuring. No fx or dislocation.  Exam suggestive of ligament strain/ankle sprain.  No hx or exam to suggest joint infection/cellulitis or gout.  She was placed in an ankle air stirrup, discussed RICE, increased ibuprofen. F/u with ortho if not improving with this tx plan.  The patient appears reasonably screened and/or stabilized for discharge and I doubt any other medical condition or other [facility] requiring further screening, evaluation, or treatment in the ED at this time prior to discharge.  Final Clinical Impression(s) / ED Diagnoses Final diagnoses:  Acute left ankle pain    Rx / DC Orders ED Discharge Orders         Ordered    ibuprofen (ADVIL) 600 MG tablet  Every 8 hours PRN     12/01/19 1203           Evalee Jefferson, PA-C 12/01/19 1257    Fredia Sorrow, MD 12/02/19 [phone]

## 2019-12-10 ENCOUNTER — Ambulatory Visit: Payer: Self-pay | Admitting: Family Medicine

## 2019-12-11 ENCOUNTER — Encounter: Payer: Self-pay | Admitting: Adult Health

## 2019-12-12 ENCOUNTER — Ambulatory Visit: Payer: Self-pay | Admitting: Family Medicine

## 2020-01-07 ENCOUNTER — Encounter (HOSPITAL_COMMUNITY): Payer: Self-pay | Admitting: *Deleted

## 2020-01-07 ENCOUNTER — Emergency Department (HOSPITAL_COMMUNITY)
Admission: EM | Admit: 2020-01-07 | Discharge: 2020-01-07 | Disposition: A | Discharge status: Home / Self Care | Payer: Medicaid Other | Attending: Emergency Medicine | Admitting: Emergency Medicine

## 2020-01-07 ENCOUNTER — Other Ambulatory Visit: Payer: Self-pay

## 2020-01-07 DIAGNOSIS — R300 Dysuria: Secondary | ICD-10-CM | POA: Insufficient documentation

## 2020-01-07 DIAGNOSIS — Z87891 Personal history of nicotine dependence: Secondary | ICD-10-CM | POA: Insufficient documentation

## 2020-01-07 DIAGNOSIS — R1030 Lower abdominal pain, unspecified: Secondary | ICD-10-CM | POA: Insufficient documentation

## 2020-01-07 DIAGNOSIS — R319 Hematuria, unspecified: Secondary | ICD-10-CM | POA: Insufficient documentation

## 2020-01-07 DIAGNOSIS — N390 Urinary tract infection, site not specified: Secondary | ICD-10-CM

## 2020-01-07 LAB — URINALYSIS, ROUTINE W REFLEX MICROSCOPIC
Bilirubin Urine: NEGATIVE
Glucose, UA: NEGATIVE mg/dL
Ketones, ur: 20 mg/dL — AB
Nitrite: NEGATIVE
Protein, ur: 100 mg/dL — AB
Specific Gravity, Urine: 1.029 (ref 1.005–1.030)
WBC, UA: >50 WBC/hpf — ABNORMAL HIGH (ref 0–5)
pH: 5 (ref 5.0–8.0)

## 2020-01-07 LAB — PREGNANCY, URINE: Preg Test, Ur: NEGATIVE

## 2020-01-07 MED ORDER — NITROFURANTOIN MONOHYD MACRO 100 MG PO CAPS: 100.0000 mg | ORAL_CAPSULE | Freq: Two times a day (BID) | ORAL | 0 refills | Status: DC

## 2020-01-07 NOTE — ED Provider Notes (Signed)
[facility] EMERGENCY DEPARTMENT Provider Note   CSN: [id number] Arrival date & time: 01/07/20  1543     History Chief Complaint  Patient presents with  . Urinary Frequency  . Dysuria    Alicia [name] is a 29 y.o. female who presents with concern for UTI. She states yesterday she started to have urinary frequency with associated dysuria at the end of urination.  She seen some blood in the urine starting today.  She still has some associated suprapubic pressure.  She states she gets frequent UTIs and this feels the same.  She denies fever, chills, flank pain, nausea or vomiting.  Her last menstrual period was 3 weeks ago.  She states she is not currently sexually active denies vaginal discharge or concern for STI.  HPI     Past Medical History:  Diagnosis Date  . Anemia   . Blood in urine 12/23/2015  . Contraceptive management 01/22/2015  . Heavy menstrual period 04/28/2014  . HSV-2 infection   . Urinary frequency 12/23/2015  . Urinary tract infection   . Vaginal discharge 12/31/2014  . Vaginal itching 12/31/2014  . Vaginal odor 12/03/2015  . Yeast infection 12/31/2014    Patient Active Problem List   Diagnosis Date Noted  . Sprain of left ankle 12/01/2019  . Blood in urine 12/23/2015  . Yeast infection 12/31/2014  . BV (bacterial vaginosis) 12/01/2014  . 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 DILATION AND CURETTAGE OF UTERUS N/A 06/19/2015   Procedure: SUCTION AND SHARP UTERINE CURETTAGE;  Surgeon: Florian Buff, MD;  Location: [facility];  Service: Gynecology;  Laterality: N/A;  . LIPOMA EXCISION  07/2016   left eye  . NO PAST SURGERIES       OB History    Gravida  2   Para  1   Term  1   Preterm  0   AB  1   Living  1     SAB  0   TAB  1   Ectopic  0   Multiple      Live Births  1        Obstetric Comments  TAB 16 weeks        Family History  Problem Relation Age of Onset  . Hypertension Mother     . Anemia Mother   . Cancer Maternal Grandmother        breast  . Diabetes Father   . Heart attack Paternal Grandfather     Social History   Tobacco Use  . Smoking status: Former Smoker    Packs/day: 0.50    Years: 2.00    Pack years: 1.00    Types: Cigarettes  . Smokeless tobacco: Never Used  Substance Use Topics  . Alcohol use: No  . Drug use: No    Types: Marijuana    Comment: prior to pregnancy    Home Medications Prior to Admission medications   Medication Sig Start Date End Date Taking? Authorizing Provider  ibuprofen (ADVIL) 600 MG tablet Take 1 tablet (600 mg total) by mouth every 8 (eight) hours as needed. 12/01/19   Evalee Jefferson, PA-C    Allergies    Penicillins and Sulfa antibiotics  Review of Systems   Review of Systems  Constitutional: Negative for fever.  Gastrointestinal: Positive for abdominal pain. Negative for nausea and vomiting.  Genitourinary: Positive for dysuria, frequency and hematuria. Negative for flank pain, pelvic pain, vaginal  bleeding and vaginal discharge.    Physical Exam Updated Vital Signs BP 115/80   Pulse 87   Temp 98.2 F (36.8 C)   Resp 20   Ht 5\' 2"  (1.575 m)   Wt 70.3 kg   LMP 12/17/2019   SpO2 100%   BMI 28.35 kg/m   Physical Exam Vitals and nursing note reviewed.  Constitutional:      General: She is not in acute distress.    Appearance: Normal appearance. She is well-developed. She is not ill-appearing.  HENT:     Head: Normocephalic and atraumatic.  Eyes:     General: No scleral icterus.       Right eye: No discharge.        Left eye: No discharge.     Conjunctiva/sclera: Conjunctivae normal.     Pupils: Pupils are equal, round, and reactive to light.  Cardiovascular:     Rate and Rhythm: Normal rate and regular rhythm.  Pulmonary:     Effort: Pulmonary effort is normal. No respiratory distress.     Breath sounds: Normal breath sounds.  Abdominal:     General: There is no distension.     Palpations:  Abdomen is soft.     Tenderness: There is no abdominal tenderness.  Musculoskeletal:     Cervical back: Normal range of motion.  Skin:    General: Skin is warm and dry.  Neurological:     Mental Status: She is alert and oriented to person, place, and time.  Psychiatric:        Behavior: Behavior normal.     ED Results / Procedures / Treatments   Labs (all labs ordered are listed, but only abnormal results are displayed) Labs Reviewed  URINALYSIS, ROUTINE W REFLEX MICROSCOPIC - Abnormal; Notable for the following components:      Result Value   APPearance CLOUDY (*)    Hgb urine dipstick MODERATE (*)    Ketones, ur 20 (*)    Protein, ur 100 (*)    Leukocytes,Ua MODERATE (*)    WBC, UA >50 (*)    Bacteria, UA RARE (*)    All other components within normal limits  URINE CULTURE  PREGNANCY, URINE    EKG None  Radiology No results found.  Procedures Procedures (including critical care time)  Medications Ordered in ED Medications - No data to display  ED Course  I have reviewed the triage vital signs and the nursing notes.  Pertinent labs & imaging results that were available during my care of the patient were reviewed by me and considered in my medical decision making (see chart for details).  29 year old female presents with UTI symptoms. UA is consistent with UTI. No signs/symptoms concerning for pyelonephritis or STI. Pregnancy test is negative. Will culture urine and start on Macrobid due to PCN allergy. Advised AZO for symptomatic relief.  MDM Rules/Calculators/A&P                       Final Clinical Impression(s) / ED Diagnoses Final diagnoses:  Lower urinary tract infectious disease    Rx / DC Orders ED Discharge Orders    None       Recardo Evangelist, PA-C 01/07/20 1727    Lucrezia Starch, MD 01/07/20 2356

## 2020-01-07 NOTE — ED Triage Notes (Signed)
Painful urination onset yesterday

## 2020-01-07 NOTE — Discharge Instructions (Signed)
Take Macrobid twice daily for 5 days You can take Azo over the counter for painful urination Please return if you are worsening

## 2020-01-08 LAB — URINE CULTURE

## 2020-01-28 ENCOUNTER — Other Ambulatory Visit: Payer: Self-pay

## 2020-01-28 ENCOUNTER — Encounter: Payer: Self-pay | Admitting: Adult Health

## 2020-01-28 ENCOUNTER — Other Ambulatory Visit (HOSPITAL_COMMUNITY)
Admission: RE | Admit: 2020-01-28 | Discharge: 2020-01-28 | Disposition: A | Discharge status: Home / Self Care | Payer: Medicaid Other | Source: Ambulatory Visit | Attending: Adult Health | Admitting: Adult Health

## 2020-01-28 ENCOUNTER — Ambulatory Visit (INDEPENDENT_AMBULATORY_CARE_PROVIDER_SITE_OTHER): Payer: Medicaid Other | Admitting: Adult Health

## 2020-01-28 VITALS — BP 115/78 | HR 89 | Ht 62.0 in | Wt 138.0 lb

## 2020-01-28 DIAGNOSIS — Z113 Encounter for screening for infections with a predominantly sexual mode of transmission: Secondary | ICD-10-CM | POA: Insufficient documentation

## 2020-01-28 DIAGNOSIS — Z01419 Encounter for gynecological examination (general) (routine) without abnormal findings: Secondary | ICD-10-CM | POA: Insufficient documentation

## 2020-01-28 DIAGNOSIS — Z3009 Encounter for other general counseling and advice on contraception: Secondary | ICD-10-CM | POA: Insufficient documentation

## 2020-01-28 DIAGNOSIS — Z1151 Encounter for screening for human papillomavirus (HPV): Secondary | ICD-10-CM

## 2020-01-28 DIAGNOSIS — Z30011 Encounter for initial prescription of contraceptive pills: Secondary | ICD-10-CM | POA: Insufficient documentation

## 2020-01-28 MED ORDER — LO LOESTRIN FE 1 MG-10 MCG / 10 MCG PO TABS: 1.0000 | ORAL_TABLET | Freq: Every day | ORAL | 11 refills | Status: DC

## 2020-01-28 NOTE — Progress Notes (Signed)
Patient ID: [name], female   DOB: [birth year], 29 y.o.   MRN: [id number] History of Present Illness: Chani is a 29 year old black female,single,G2P1011, in for a well woman gyn exam and pap, has family planning medicaid and wants to get on birth control she prefers a pill, and denies any history of  stroke, heart attack, DVTs, or migraines with aura. Has noticed vaginal odor.   Current Medications, Allergies, Past Medical History, Past Surgical History, Family History and Social History were reviewed in [employer] record.     Review of Systems: Patient denies any headaches, hearing loss, fatigue, blurred vision, shortness of breath, chest pain, abdominal pain, problems with bowel movements, urination, or intercourse. No joint pain or mood swings. +vaginal odor.   Physical Exam:BP 115/78 (BP Location: Left Arm, Patient Position: Sitting, Cuff Size: Normal)   Pulse 89   Ht 5\' 2"  (1.575 m)   Wt 138 lb (62.6 kg)   LMP 01/07/2020   BMI 25.24 kg/m  General:  Well developed, well nourished, no acute distress Skin:  Warm and dry Neck:  Midline trachea, normal thyroid, good ROM, no lymphadenopathy Lungs; Clear to auscultation bilaterally Breast:  No dominant palpable mass, retraction, or nipple discharge,has bilateral nipple rods Cardiovascular: Regular rate and rhythm Abdomen:  Soft, non tender, no hepatosplenomegaly Pelvic:  External genitalia is normal in appearance, no lesions.  The vagina is normal in appearance. Urethra has no lesions or masses. The cervix is bulbous. Pap with GC/CHL and high risk HPV 16/18 genotyping performed. Uterus is felt to be normal size, shape, and contour.  No adnexal masses or tenderness noted.Bladder is non tender, no masses felt Extremities/musculoskeletal:  No swelling or varicosities noted, no clubbing or cyanosis Psych:  No mood changes, alert and cooperative,seems happy Fall risk is low Alcohol audit is 1 PHQ 9 score is  0. Examination chaperoned by Estill Bamberg Rash.   Impression and Plan:  1. Encounter for gynecological examination with Papanicolaou smear of cervix Pap sent Physical in 1 year Pap in 3 if normal   2. Family planning Check HIV and RPR   3. Encounter for initial prescription of contraceptive pills Will Rx Lo Loestrin, start with next period,use condoms Meds ordered this encounter  Medications  . Norethindrone-Ethinyl Estradiol-Fe Biphas (LO LOESTRIN FE) 1 MG-10 MCG / 10 MCG tablet    Sig: Take 1 tablet by mouth daily. Take 1 daily by mouth    Dispense:  1 Package    Refill:  11    BIN [id number], PCN [id number], GRP [id number]    Order Specific Question:   Supervising Provider    Answer:   Tania Ade H [[id number]]  Follow up in 3 months   4. Screening examination for STD (sexually transmitted disease) Check HIV and RPR

## 2020-02-03 LAB — CYTOLOGY - PAP
Chlamydia: NEGATIVE
Comment: NEGATIVE
Comment: NEGATIVE
Comment: NORMAL
Diagnosis: NEGATIVE
High risk HPV: NEGATIVE
Neisseria Gonorrhea: NEGATIVE

## 2020-02-04 ENCOUNTER — Other Ambulatory Visit (INDEPENDENT_AMBULATORY_CARE_PROVIDER_SITE_OTHER): Payer: BC Managed Care – PPO

## 2020-02-04 ENCOUNTER — Other Ambulatory Visit (HOSPITAL_COMMUNITY)
Admission: RE | Admit: 2020-02-04 | Discharge: 2020-02-04 | Disposition: A | Discharge status: Home / Self Care | Payer: Medicaid Other | Source: Ambulatory Visit | Attending: Obstetrics & Gynecology | Admitting: Obstetrics & Gynecology

## 2020-02-04 ENCOUNTER — Other Ambulatory Visit: Payer: Self-pay

## 2020-02-04 ENCOUNTER — Other Ambulatory Visit: Payer: Self-pay | Admitting: Adult Health

## 2020-02-04 ENCOUNTER — Inpatient Hospital Stay
Admission: RE | Admit: 2020-02-04 | Discharge: 2020-02-04 | Disposition: A | Discharge status: Home / Self Care | Payer: Medicaid Other | Source: Ambulatory Visit

## 2020-02-04 DIAGNOSIS — N899 Noninflammatory disorder of vagina, unspecified: Secondary | ICD-10-CM

## 2020-02-04 DIAGNOSIS — R319 Hematuria, unspecified: Secondary | ICD-10-CM

## 2020-02-04 DIAGNOSIS — N898 Other specified noninflammatory disorders of vagina: Secondary | ICD-10-CM | POA: Diagnosis not present

## 2020-02-04 LAB — POCT URINALYSIS DIPSTICK OB
Glucose, UA: NEGATIVE
Ketones, UA: NEGATIVE
Leukocytes, UA: NEGATIVE
Nitrite, UA: NEGATIVE
POC,PROTEIN,UA: NEGATIVE

## 2020-02-04 NOTE — Addendum Note (Signed)
Addended by: Diona Fanti A on: 02/04/2020 10:43 AM   Modules accepted: Orders

## 2020-02-04 NOTE — Progress Notes (Signed)
Chart reviewed for nurse visit. Agree with plan of care.  Estill Dooms, NP 02/04/2020 12:28 PM

## 2020-02-04 NOTE — Progress Notes (Signed)
   NURSE VISIT- UTI SYMPTOMS   SUBJECTIVE:  Alicia [name] is a 29 y.o. G31P1011 female here for UTI symptoms. She is a GYN patient. She reports blood urine,pain when pee and vaginal itching with odor  OBJECTIVE:  LMP 01/07/2020   Appears well, in no apparent distress    ASSESSMENT:  urine showed small blood in urine  PLAN:discuss with jennifer griffin advise sent urine culture and do nuswab      Urine culture and Nuswab sent Call or return to clinic prn if these symptoms worsen or fail to improve as anticipated. Follow-up as need    Ladonna Snide  02/04/2020 10:30 AM

## 2020-02-05 LAB — CERVICOVAGINAL ANCILLARY ONLY
Chlamydia: NEGATIVE
Comment: NEGATIVE
Comment: NORMAL
Neisseria Gonorrhea: NEGATIVE

## 2020-02-10 LAB — URINE CULTURE

## 2020-02-12 ENCOUNTER — Telehealth: Payer: Self-pay | Admitting: Adult Health

## 2020-02-12 MED ORDER — NITROFURANTOIN MONOHYD MACRO 100 MG PO CAPS: 100.0000 mg | ORAL_CAPSULE | Freq: Two times a day (BID) | ORAL | 0 refills | Status: DC

## 2020-02-12 NOTE — Telephone Encounter (Signed)
Pt aware that urine + Ecoli, will rx macrobid, push water

## 2020-02-20 ENCOUNTER — Other Ambulatory Visit: Payer: Self-pay

## 2020-02-20 ENCOUNTER — Emergency Department (HOSPITAL_COMMUNITY)
Admission: EM | Admit: 2020-02-20 | Discharge: 2020-02-20 | Disposition: A | Discharge status: Home / Self Care | Payer: Medicaid Other | Attending: Emergency Medicine | Admitting: Emergency Medicine

## 2020-02-20 ENCOUNTER — Encounter (HOSPITAL_COMMUNITY): Payer: Self-pay | Admitting: *Deleted

## 2020-02-20 DIAGNOSIS — R109 Unspecified abdominal pain: Secondary | ICD-10-CM | POA: Insufficient documentation

## 2020-02-20 DIAGNOSIS — Z5321 Procedure and treatment not carried out due to patient leaving prior to being seen by health care provider: Secondary | ICD-10-CM | POA: Insufficient documentation

## 2020-02-20 LAB — URINALYSIS, ROUTINE W REFLEX MICROSCOPIC
Bilirubin Urine: NEGATIVE
Glucose, UA: NEGATIVE mg/dL
Ketones, ur: NEGATIVE mg/dL
Leukocytes,Ua: NEGATIVE
Nitrite: NEGATIVE
Protein, ur: NEGATIVE mg/dL
Specific Gravity, Urine: 1.019 (ref 1.005–1.030)
pH: 6 (ref 5.0–8.0)

## 2020-02-20 LAB — PREGNANCY, URINE: Preg Test, Ur: NEGATIVE

## 2020-02-20 NOTE — ED Triage Notes (Signed)
Pt states she was diagnosed with a UTI x one week ago and has been taking antibiotics with no relief; pt states she is having severe suprapubic pain; pt states she has been taking macrobid

## 2020-02-22 LAB — URINE CULTURE: Culture: <10000 — AB

## 2020-02-27 ENCOUNTER — Telehealth: Payer: Self-pay | Admitting: Adult Health

## 2020-02-27 NOTE — Telephone Encounter (Signed)
Telephoned patient at home number and mailbox is full unable to leave message.

## 2020-02-27 NOTE — Telephone Encounter (Signed)
Patient called stating that she was prescribed a medication by Anderson Malta and patient states that she has finished her medication but she is still having some discomfort. Pt would like to know if Anderson Malta could give her a call back to see what else can she do. Please contact pt

## 2020-03-02 ENCOUNTER — Other Ambulatory Visit: Payer: Self-pay

## 2020-03-03 ENCOUNTER — Other Ambulatory Visit: Payer: Self-pay | Admitting: *Deleted

## 2020-03-03 MED ORDER — NITROFURANTOIN MONOHYD MACRO 100 MG PO CAPS: 100.0000 mg | ORAL_CAPSULE | Freq: Two times a day (BID) | ORAL | 0 refills | Status: DC

## 2020-03-05 ENCOUNTER — Other Ambulatory Visit: Payer: Self-pay

## 2020-04-20 ENCOUNTER — Ambulatory Visit (INDEPENDENT_AMBULATORY_CARE_PROVIDER_SITE_OTHER): Payer: BC Managed Care – PPO | Admitting: Advanced Practice Midwife

## 2020-04-20 ENCOUNTER — Encounter: Payer: Self-pay | Admitting: Advanced Practice Midwife

## 2020-04-20 VITALS — BP 109/77 | HR 82 | Ht 62.0 in | Wt 135.0 lb

## 2020-04-20 DIAGNOSIS — B9689 Other specified bacterial agents as the cause of diseases classified elsewhere: Secondary | ICD-10-CM | POA: Diagnosis not present

## 2020-04-20 DIAGNOSIS — N760 Acute vaginitis: Secondary | ICD-10-CM

## 2020-04-20 MED ORDER — METRONIDAZOLE 0.75 % VA GEL: 1.0000 | Freq: Every day | VAGINAL | 0 refills | Status: DC

## 2020-04-20 NOTE — Patient Instructions (Signed)
Probiotics for the vagina:  VAGINAL:    OTC products such as Luvena, use 2-3 times a week   ORAL:   UP4 ADULT Superior, compatible and safe strains DDS  -1 L.acidophilus (super strain) with B. Longum, B.Bifidum & B.Infantis Acid-resistant-survives stomach acid and Bile salts   Fortified with prebiotic Fructooligosaccharide to enhance growth and performance Potency: 15 Billion CFU/capsule Dosage: 1 capsule daily, best before a meal.  Amazon:  $21.90 for 60 caps   iF THERE IS STILL A PROBLEM ADD,   FLORAJEN ACIDOPHILUS High Potency Acidophilus Florajen high potency acidophilus is especially effective for restoring and maintaining a healthy, comfortable balance of vaginal flora. It is also beneficial for overall intestinal health and maintaining the immune system. Beaver cultures per capsule Available in 30 and 60 capsules   Amazon:  $19.99 for 60 caps

## 2020-04-20 NOTE — Progress Notes (Signed)
[facility] Visit  Patient name: Alicia [name] MRN [id number]  Date of birth: 02/03/1991  CC & HPI:  Alicia [name] is a 29 y.o.  female presenting today for fishy vaginal odor. Can't tell if it's BV or a UTI.   no urinary sx.  Has noticed it for about a month, started when she had UTI. No other sx  Pertinent History Reviewed:  Medical & Surgical Hx:   Past Medical History:  Diagnosis Date  . Anemia   . Blood in urine 12/23/2015  . Contraceptive management 01/22/2015  . Heavy menstrual period 04/28/2014  . HSV-2 infection   . Urinary frequency 12/23/2015  . Urinary tract infection   . Vaginal discharge 12/31/2014  . Vaginal itching 12/31/2014  . Vaginal odor 12/03/2015  . Yeast infection 12/31/2014   Past Surgical History:  Procedure Laterality Date  . DILATION AND CURETTAGE OF UTERUS N/A 06/19/2015   Procedure: SUCTION AND SHARP UTERINE CURETTAGE;  Surgeon: Florian Buff, MD;  Location: [facility];  Service: Gynecology;  Laterality: N/A;  . LIPOMA EXCISION  07/2016   left eye  . NO PAST SURGERIES     Family History  Problem Relation Age of Onset  . Hypertension Mother   . Anemia Mother   . Cancer Maternal Grandmother        breast  . Diabetes Father   . Heart attack Paternal Grandfather     Current Outpatient Medications:  .  Norethindrone-Ethinyl Estradiol-Fe Biphas (LO LOESTRIN FE) 1 MG-10 MCG / 10 MCG tablet, Take 1 tablet by mouth daily. Take 1 daily by mouth, Disp: 1 Package, Rfl: 11 Social History: Reviewed -  reports that she has quit smoking. Her smoking use included cigarettes. She has a 1.00 pack-year smoking history. She has never used smokeless tobacco.  Review of Systems:   Constitutional: Negative for fever and chills Eyes: Negative for visual disturbances Respiratory: Negative for shortness of breath, dyspnea Cardiovascular: Negative for chest pain or palpitations  Gastrointestinal: Negative for vomiting, diarrhea and constipation; no abdominal  pain Genitourinary: Negative for dysuria and urgency, vaginal irritation or itching Musculoskeletal: Negative for back pain, joint pain, myalgias  Neurological: Negative for dizziness and headaches    Objective Findings:    Physical Examination: Vitals:   04/20/20 1149  BP: 109/77  Pulse: 82   General appearance - well appearing, and in no distress Mental status - alert, oriented to person, place, and time Chest:  Normal respiratory effort Heart - normal rate and regular rhythm Abdomen:  Soft, nontender Pelvic: SSE:  normal discharge, slight odor.  Wet prep few clues, no trich/wbc/yeast Musculoskeletal:  Normal range of motion without pain Extremities:  No edema    No results found for this or any previous visit (from the past 24 hour(s)).    Assessment & Plan:  A:   (mild) BV P:  rx metronidazole.  Can use probiotics if doesn' t clear up odor.    No follow-ups on file.  Christin Fudge CNM 04/20/2020 12:14 PM

## 2020-04-21 ENCOUNTER — Other Ambulatory Visit: Payer: Self-pay | Admitting: Adult Health

## 2020-04-21 MED ORDER — METRONIDAZOLE 500 MG PO TABS: 500.0000 mg | ORAL_TABLET | Freq: Two times a day (BID) | ORAL | 0 refills | Status: DC

## 2020-04-21 NOTE — Progress Notes (Signed)
Will rx flagyl, metrogel too expensive

## 2020-04-28 ENCOUNTER — Ambulatory Visit: Payer: Medicaid Other | Admitting: Adult Health

## 2020-10-19 ENCOUNTER — Encounter: Payer: Self-pay | Admitting: *Deleted

## 2020-10-19 ENCOUNTER — Other Ambulatory Visit: Payer: Self-pay

## 2020-10-19 ENCOUNTER — Ambulatory Visit (INDEPENDENT_AMBULATORY_CARE_PROVIDER_SITE_OTHER): Payer: Self-pay | Admitting: *Deleted

## 2020-10-19 ENCOUNTER — Other Ambulatory Visit: Payer: Self-pay | Admitting: Obstetrics & Gynecology

## 2020-10-19 VITALS — BP 118/70 | HR 88

## 2020-10-19 DIAGNOSIS — N926 Irregular menstruation, unspecified: Secondary | ICD-10-CM

## 2020-10-19 DIAGNOSIS — O3680X0 Pregnancy with inconclusive fetal viability, not applicable or unspecified: Secondary | ICD-10-CM

## 2020-10-19 LAB — POCT URINE PREGNANCY: Preg Test, Ur: POSITIVE — AB

## 2020-10-19 NOTE — Progress Notes (Addendum)
   NURSE VISIT- PREGNANCY CONFIRMATION   SUBJECTIVE:  Alicia [name] is a 29 y.o. G55P1011 female at [redacted]w[redacted]d by certain LMP of Patient's last menstrual period was 08/26/2020 (exact date). Here for pregnancy confirmation.  Home pregnancy test: not taken  She reports nausea and vomiting.  She is taking prenatal vitamins.    OBJECTIVE:  BP 118/70 (BP Location: Left Arm, Patient Position: Sitting, Cuff Size: Normal)   Pulse 88   LMP 08/26/2020 (Exact Date)   Appears well, in no apparent distress OB History  Gravida Para Term Preterm AB Living  3 1 1  0 1 1  SAB IAB Ectopic Multiple Live Births  0 1 0   1    # Outcome Date GA Lbr Len/2nd Weight Sex Delivery Anes PTL Lv  3 Current           2 IAB 04/29/15 [redacted]w[redacted]d         1 Term 03/08/14 [redacted]w[redacted]d 11:05 / 07:02 5 lb 11.9 oz (2.605 kg) M Vag-Spont EPI  LIV    Obstetric Comments  TAB 16 weeks    Results for orders placed or performed in visit on 10/19/20 (from the past 24 hour(s))  POCT urine pregnancy   Collection Time: 10/19/20  3:18 PM  Result Value Ref Range   Preg Test, Ur Positive (A) Negative    ASSESSMENT: Positive pregnancy test, [redacted]w[redacted]d by LMP    PLAN: Schedule for dating ultrasound in next  available Prenatal vitamins: continue   Nausea medicines: not currently needed   OB packet given: Yes  [redacted]w[redacted]d  10/19/2020 3:19 PM   Chart reviewed for nurse visit. Agree with plan of care.  10/21/2020, Jacklyn Shell 10/19/2020 3:25 PM

## 2020-10-20 ENCOUNTER — Ambulatory Visit (INDEPENDENT_AMBULATORY_CARE_PROVIDER_SITE_OTHER): Payer: Self-pay

## 2020-10-20 DIAGNOSIS — Z3A01 Less than 8 weeks gestation of pregnancy: Secondary | ICD-10-CM

## 2020-10-20 DIAGNOSIS — O3680X0 Pregnancy with inconclusive fetal viability, not applicable or unspecified: Secondary | ICD-10-CM

## 2020-10-20 NOTE — Progress Notes (Signed)
[place] 7+6 WKS,single IUP with YS,CRL 10.23 mm,FHR 133 bpm

## 2020-10-21 ENCOUNTER — Telehealth: Payer: Self-pay | Admitting: Advanced Practice Midwife

## 2020-10-21 NOTE — Telephone Encounter (Signed)
Patient called and is experiencing some spotting and wants to know if it's normal. Clinical staff will follow up.

## 2020-10-24 NOTE — L&D Delivery Note (Addendum)
Delivery Note At 12:14 PM a viable female was delivered via vaginal delivery (Presentation: LOA).  APGAR: 8, 9; weight pending.   Placenta status: delivered spontaneous, normal and intact. Cord: 3 vessel cord with the following complications: none. Cord pH: N/A  Anesthesia: Epidural Episiotomy: None  Lacerations: superficial bilateral labial lacerations, hemostatic  Suture Repair: N/A Est. Blood Loss (mL): 75 mL   Mom to postpartum.  Baby to Couplet care / Skin to Skin.  Cecilie Lowers 05/13/2021, 12:39 PM   Midwife attestation: I was gloved and present for delivery in its entirety and I agree with the above student's note.  Julianne Handler, CNM 1:34 PM

## 2020-10-26 ENCOUNTER — Other Ambulatory Visit: Payer: Self-pay | Admitting: Women's Health

## 2020-10-26 ENCOUNTER — Telehealth: Payer: Self-pay | Admitting: Women's Health

## 2020-10-26 MED ORDER — DOXYLAMINE-PYRIDOXINE 10-10 MG PO TBEC: DELAYED_RELEASE_TABLET | ORAL | 6 refills | Status: DC

## 2020-10-26 NOTE — Telephone Encounter (Signed)
Pt requesting something for nausea, states she's not been able to keep anything down for 2 weeks  Please advise, states she's tried everything on the list she was given

## 2020-10-26 NOTE — Telephone Encounter (Signed)
Pt aware Diclegis was sent to [facility] and she should be able to pick up later today. Pt voiced understanding. JSY

## 2020-10-26 NOTE — Telephone Encounter (Signed)
Pt states her nausea is bad. OTC meds has not helped. Pt states her mouth is moist and she is urinating like normal. Can you send in nausea med? Thanks!! JSY

## 2020-11-02 ENCOUNTER — Telehealth: Payer: Self-pay | Admitting: Adult Health

## 2020-11-02 ENCOUNTER — Other Ambulatory Visit: Payer: Self-pay | Admitting: Women's Health

## 2020-11-02 DIAGNOSIS — L299 Pruritus, unspecified: Secondary | ICD-10-CM

## 2020-11-02 NOTE — Telephone Encounter (Signed)
Pt called stating that she is itching during pregnancy and very miserable. Returned her call and pt stated that she was experiencing the same thing as in her first pregnancy with itchy palms and soles of her feet. She was diagnosed with cholestasis at approx 38 weeks. She is concerned considering the possible complications with the baby.  Assured the pt that a provider would be contacted and I would call her back with any advice or prescription info.  Pharmacy preference is [facility]

## 2020-11-02 NOTE — Telephone Encounter (Signed)
Patient is experiencing body itching and wants a nurse to call her back. Clinical Staff will follow up with patient.

## 2020-11-04 ENCOUNTER — Other Ambulatory Visit: Payer: Self-pay | Admitting: Women's Health

## 2020-11-04 ENCOUNTER — Telehealth: Payer: Self-pay | Admitting: *Deleted

## 2020-11-04 ENCOUNTER — Telehealth: Payer: Self-pay | Admitting: Women's Health

## 2020-11-04 DIAGNOSIS — Z3A10 10 weeks gestation of pregnancy: Secondary | ICD-10-CM

## 2020-11-04 DIAGNOSIS — R7989 Other specified abnormal findings of blood chemistry: Secondary | ICD-10-CM

## 2020-11-04 NOTE — Telephone Encounter (Signed)
Patient called stating that she seen on her Mychart the results of her Blood Work, Patient would like a call back from the nurse to help her understand the results. Please contact pt

## 2020-11-04 NOTE — Telephone Encounter (Signed)
Patient informed that since Bile acids have not resulted yet, that we cannot give her a diagnosis at this time.  A hepatitis panel has been added as well. Pt verbalized understanding and all questions answered.

## 2020-11-05 LAB — BILE ACIDS, TOTAL: Bile Acids Total: 209.6 umol/L — ABNORMAL HIGH (ref 0.0–10.0)

## 2020-11-05 LAB — COMPREHENSIVE METABOLIC PANEL
ALT: 1186 IU/L (ref 0–32)
AST: 448 IU/L — ABNORMAL HIGH (ref 0–40)
Albumin/Globulin Ratio: 1.2 (ref 1.2–2.2)
Albumin: 4.1 g/dL (ref 3.9–5.0)
Alkaline Phosphatase: 168 IU/L — ABNORMAL HIGH (ref 44–121)
BUN/Creatinine Ratio: 8 — ABNORMAL LOW (ref 9–23)
BUN: 6 mg/dL (ref 6–20)
Bilirubin Total: 1.2 mg/dL (ref 0.0–1.2)
CO2: 19 mmol/L — ABNORMAL LOW (ref 20–29)
Calcium: 10.1 mg/dL (ref 8.7–10.2)
Chloride: 99 mmol/L (ref 96–106)
Creatinine, Ser: 0.71 mg/dL (ref 0.57–1.00)
GFR calc Af Amer: 133 mL/min/{1.73_m2} (ref >59)
GFR calc non Af Amer: 115 mL/min/{1.73_m2} (ref >59)
Globulin, Total: 3.4 g/dL (ref 1.5–4.5)
Glucose: 101 mg/dL — ABNORMAL HIGH (ref 65–99)
Potassium: 3.6 mmol/L (ref 3.5–5.2)
Sodium: 134 mmol/L (ref 134–144)
Total Protein: 7.5 g/dL (ref 6.0–8.5)

## 2020-11-06 ENCOUNTER — Other Ambulatory Visit: Payer: Self-pay | Admitting: *Deleted

## 2020-11-06 ENCOUNTER — Encounter: Payer: Self-pay | Admitting: *Deleted

## 2020-11-06 LAB — SPECIMEN STATUS REPORT

## 2020-11-06 MED ORDER — URSODIOL 300 MG PO CAPS: 300.0000 mg | ORAL_CAPSULE | Freq: Three times a day (TID) | ORAL | 6 refills | Status: DC

## 2020-11-06 NOTE — Progress Notes (Signed)
actigal

## 2020-11-07 LAB — HEPATITIS PANEL, ACUTE
Hep A IgM: NEGATIVE
Hep B C IgM: NEGATIVE
Hep C Virus Ab: <0.1 s/co ratio (ref 0.0–0.9)
Hepatitis B Surface Ag: NEGATIVE

## 2020-11-07 LAB — SPECIMEN STATUS REPORT

## 2020-11-10 ENCOUNTER — Other Ambulatory Visit: Payer: Self-pay | Admitting: Women's Health

## 2020-11-10 DIAGNOSIS — R7989 Other specified abnormal findings of blood chemistry: Secondary | ICD-10-CM

## 2020-11-10 DIAGNOSIS — Z3A10 10 weeks gestation of pregnancy: Secondary | ICD-10-CM

## 2020-11-10 DIAGNOSIS — K831 Obstruction of bile duct: Secondary | ICD-10-CM

## 2020-11-10 MED ORDER — URSODIOL 300 MG PO CAPS: 900.0000 mg | ORAL_CAPSULE | Freq: Three times a day (TID) | ORAL | 9 refills | Status: DC

## 2020-11-11 ENCOUNTER — Encounter: Payer: Self-pay | Admitting: Women's Health

## 2020-11-11 ENCOUNTER — Other Ambulatory Visit: Payer: Self-pay | Admitting: Women's Health

## 2020-11-11 DIAGNOSIS — Z8719 Personal history of other diseases of the digestive system: Secondary | ICD-10-CM | POA: Insufficient documentation

## 2020-11-11 DIAGNOSIS — K831 Obstruction of bile duct: Secondary | ICD-10-CM | POA: Insufficient documentation

## 2020-11-11 DIAGNOSIS — R7989 Other specified abnormal findings of blood chemistry: Secondary | ICD-10-CM | POA: Insufficient documentation

## 2020-11-12 ENCOUNTER — Ambulatory Visit (INDEPENDENT_AMBULATORY_CARE_PROVIDER_SITE_OTHER): Payer: Self-pay | Admitting: Internal Medicine

## 2020-11-19 ENCOUNTER — Other Ambulatory Visit (INDEPENDENT_AMBULATORY_CARE_PROVIDER_SITE_OTHER): Payer: Self-pay | Admitting: *Deleted

## 2020-11-19 ENCOUNTER — Other Ambulatory Visit: Payer: Self-pay

## 2020-11-19 ENCOUNTER — Encounter (INDEPENDENT_AMBULATORY_CARE_PROVIDER_SITE_OTHER): Payer: Self-pay | Admitting: Internal Medicine

## 2020-11-19 ENCOUNTER — Other Ambulatory Visit: Payer: Self-pay | Admitting: *Deleted

## 2020-11-19 ENCOUNTER — Ambulatory Visit (INDEPENDENT_AMBULATORY_CARE_PROVIDER_SITE_OTHER): Payer: Medicaid Other | Admitting: Internal Medicine

## 2020-11-19 VITALS — BP 112/69 | HR 85 | Temp 98.3°F | Ht 62.0 in | Wt 128.0 lb

## 2020-11-19 DIAGNOSIS — R7989 Other specified abnormal findings of blood chemistry: Secondary | ICD-10-CM | POA: Diagnosis not present

## 2020-11-19 DIAGNOSIS — O26619 Liver and biliary tract disorders in pregnancy, unspecified trimester: Secondary | ICD-10-CM

## 2020-11-19 DIAGNOSIS — K831 Obstruction of bile duct: Secondary | ICD-10-CM

## 2020-11-19 NOTE — Patient Instructions (Signed)
Physician will call with results of blood test when completed and with further recommendations 

## 2020-11-19 NOTE — Progress Notes (Signed)
Reason for consultation  Gestational cholestasis. Patient is [redacted] weeks pregnant.  History of present illness  Patient is 30 year old African-American female who is referred through courtesy of Dr. Darlis Loan for abnormal LFTs felt to be due to intrahepatic cholestasis of pregnancy. Patient recalls that she had similar but milder symptoms during her first pregnancy back in 2015.  She brought this to her physician's attention when she was [redacted] weeks pregnant.  Her bile acid was twice normal.  She had uneventful pregnancy. She is now [redacted] weeks pregnant.  She reports itching started about 4 weeks ago and has been gradually getting worse.  She has generalized pruritus that is worse in her feet and palms of her hand.  She has difficulty sleeping at night.  She has been on high-dose Urso for 9 days but cannot tell any difference. Her appetite is good.  She denies nausea or vomiting.  She has a history of GERD.  She has been on omeprazole for about 2 years.  She was thought to have an ulcer.  Her H. pylori serology was negative and her symptoms responded to PPI. She has not experienced fever chills or abdominal pain except she had transient lower abdominal cramping 2 days ago which she believes is because she was helping move furniture at the store. She received hepatitis a and B vaccination when she was in middle school. She does not drink alcohol.  He used to smoke marijuana but not anymore.  No history of IV drug use. She has multiple tattoos.  These are all done few years ago by a professional.  Current Medications: Outpatient Encounter Medications as of 11/19/2020  Medication Sig  . Doxylamine-Pyridoxine (DICLEGIS) 10-10 MG TBEC 2 tabs q hs, if sx persist add 1 tab q am on day 3, if sx persist add 1 tab q afternoon on day 4  . guaiFENesin (MUCINEX) 600 MG 12 hr tablet Take by mouth daily.  Marland Kitchen omeprazole (PRILOSEC) 20 MG capsule Take 20 mg by mouth daily before breakfast.  . ursodiol (ACTIGALL) 300 MG  capsule Take 3 capsules (900 mg total) by mouth 3 (three) times daily.  . [DISCONTINUED] metroNIDAZOLE (METROGEL VAGINAL) 0.75 % vaginal gel Place 1 Applicatorful vaginally at bedtime. FOR 5 NIGHTS (Patient not taking: Reported on 11/19/2020)   No facility-administered encounter medications on file as of 11/19/2020.   Past medical history  GERD of 2 years duration. Cholestasis during last pregnancy in third trimester(vaginal delivery May 2015) D&C in August 2016 H. pylori serology - January 2019  Allergies  Allergies  Allergen Reactions  . Penicillins Hives    Has patient had a PCN reaction causing immediate rash, facial/tongue/throat swelling, SOB or lightheadedness with hypotension: No Has patient had a PCN reaction causing severe rash involving mucus membranes or skin necrosis: No Has patient had a PCN reaction that required hospitalization No Has patient had a PCN reaction occurring within the last 10 years: No If all of the above answers are "NO", then may proceed with Cephalosporin use.   . Sulfa Antibiotics Hives and Swelling   Family history  Father has diabetes mellitus and mother has hypertension. He has 2 brothers ages 56 and 46 in good health. Family history is negative for liver disease  Social history  Patient is single.  She has 60-year-old son in good health She does not drink alcohol or smoke cigarettes.  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  She started this job 2 weeks ago.  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   Physical examination  Blood pressure 112/69, pulse 85, temperature 98.3 F (36.8 C), temperature source Oral, height _0  (1.575 m), weight 128 lb (58.1 kg), last menstrual period 08/26/2020. She is alert and in no acute distress. Conjunctiva is pink. Sclera is nonicteric Oropharyngeal mucosa is normal. No neck masses or thyromegaly noted. Cardiac exam with regular rhythm normal S1 and S2. No murmur  or gallop noted. Lungs are clear to auscultation. Abdomen is full.  She has tattooing hypogastric region.  Bowel sounds are normal.  On palpation abdomen is soft and nontender.  Liver edge below right costal margin is sharp and nontender.  Liver is not enlarged by palpation or percussion. No LE edema or clubbing noted.  Labs/studies Results:   CBC Latest Ref Rng & Units 11/02/2017 01/11/2016 06/17/2015  WBC 3.8 - 10.8 Thousand/uL 7.1 8.8 6.7  Hemoglobin 11.7 - 15.5 g/dL 13.8 11.3(L) 10.4(L)  Hematocrit 35.0 - 45.0 % 40.2 36.7 33.2(L)  Platelets 140 - 400 Thousand/uL 265 257 298    CMP Latest Ref Rng & Units 11/03/2020 11/02/2017 01/11/2016  Glucose 65 - 99 mg/dL 101(H) - 92  BUN 6 - 20 mg/dL 6 - 13  Creatinine 0.57 - 1.00 mg/dL 0.71 - 0.54  Sodium 134 - 144 mmol/L 134 - 137  Potassium 3.5 - 5.2 mmol/L 3.6 - 3.9  Chloride 96 - 106 mmol/L 99 - 104  CO2 20 - 29 mmol/L 19(L) - 27  Calcium 8.7 - 10.2 mg/dL 10.1 - 8.9  Total Protein 6.0 - 8.5 g/dL 7.5 7.8 8.0  Total Bilirubin 0.0 - 1.2 mg/dL 1.2 0.5 0.5  Alkaline Phos 44 - 121 IU/L 168(H) - 62  AST 0 - 40 IU/L 448(H) 13 20  ALT 0 - 32 IU/L 1,186(HH) 16 53    Hepatic Function Latest Ref Rng & Units 11/03/2020 11/02/2017 01/11/2016  Total Protein 6.0 - 8.5 g/dL 7.5 7.8 8.0  Albumin 3.9 - 5.0 g/dL 4.1 - 4.0  AST 0 - 40 IU/L 448(H) 13 20  ALT 0 - 32 IU/L 1,186(HH) 16 53  Alk Phosphatase 44 - 121 IU/L 168(H) - 62  Total Bilirubin 0.0 - 1.2 mg/dL 1.2 0.5 0.5  Bilirubin, Direct 0.0 - 0.2 mg/dL - 0.1 -    Lab data from 11/03/2020.  Bile acids 209.6(upper limit of normal 10)  Hepatitis B surface antigen negative Hepatitis A IgM antibody negative Hepatitis B C IgM antibody negative Hepatitis C virus antibody negative  Abdominal ultrasound is pending.   Assessment:  #1.  Abnormal LFTs.  Patient is [redacted] weeks pregnant.  Her presentation is felt to be typical of benign intrahepatic cholestasis of pregnancy given markedly elevated bile acids.   She is not acutely ill.  She has pruritus which is interfering with quality of her life and her sleep.  However her AST and ALT are markedly elevated and of concern.  Degree of transaminase elevation usually is no more than 2-3 folds.  Therefore other conditions need to be considered.  She could have a second illness along with intrahepatic cholestasis.  She is on high-dose ursodeoxycholic acid but it does not appear to be helping with pruritus.  Will consider cholestyramine and hydroxyzine once current lab studies are available for review. Patient has multiple tattoos which is a risk factor for HCV infection.  HCV antibody is negative.  I do not believe that she has acute hepatitis C but since transaminases are  markedly elevated we will do a follow-up testing with HCVRNA.   #2.  Chronic GERD.  Heartburn is well controlled with omeprazole at low dose.  Once her pregnancy is over she may consider using it on as-needed basis.   Recommendations  Patient will go to the lab for following   LFTs, INR, anti-smooth muscle antibody, ANA, antimitochondrial antibody, alpha-1 antitrypsin, ceruloplasmin and HCVRNA by PCR. Also requested if ultrasound could be performed sooner than November 25, 2018. Patient advised not to take OTC medications Will contact patient with results of the studies and further recommendations.

## 2020-11-19 NOTE — Progress Notes (Signed)
RUQ 

## 2020-11-20 ENCOUNTER — Other Ambulatory Visit: Payer: Self-pay | Admitting: Obstetrics & Gynecology

## 2020-11-20 ENCOUNTER — Telehealth (INDEPENDENT_AMBULATORY_CARE_PROVIDER_SITE_OTHER): Payer: Self-pay | Admitting: *Deleted

## 2020-11-20 DIAGNOSIS — Z3682 Encounter for antenatal screening for nuchal translucency: Secondary | ICD-10-CM

## 2020-11-20 NOTE — Telephone Encounter (Signed)
Per Dr.Rehman after reviewing the patient's lab results . Call in the hydroxyzine 25 mg - Patient is to take 1 by mouth at bedtime as needed for itching. # 30 no refills. Questran 4 grams -Patient is to take 4 grams by mouth twice a day. Patient is to take 1 hour before before or 4 to 6 hours after cholestyramine. 1 month and 1 refill. This was called to [facility]/April.

## 2020-11-21 LAB — HCV RNA QUANT: Hepatitis C Quantitation: NOT DETECTED IU/mL

## 2020-11-21 LAB — ANA: ANA Titer 1: NEGATIVE

## 2020-11-22 LAB — MITOCHONDRIAL ANTIBODIES: Mitochondrial Ab: <20 Units (ref 0.0–20.0)

## 2020-11-22 LAB — CERULOPLASMIN: Ceruloplasmin: 46.8 mg/dL — ABNORMAL HIGH (ref 19.0–39.0)

## 2020-11-22 LAB — HEPATIC FUNCTION PANEL
ALT: 116 IU/L — ABNORMAL HIGH (ref 0–32)
AST: 34 IU/L (ref 0–40)
Albumin: 4.3 g/dL (ref 3.9–5.0)
Alkaline Phosphatase: 132 IU/L — ABNORMAL HIGH (ref 44–121)
Bilirubin Total: 0.5 mg/dL (ref 0.0–1.2)
Bilirubin, Direct: 0.29 mg/dL (ref 0.00–0.40)
Total Protein: 7.3 g/dL (ref 6.0–8.5)

## 2020-11-22 LAB — ANTI-SMOOTH MUSCLE ANTIBODY, IGG: Smooth Muscle Ab: 7 Units (ref 0–19)

## 2020-11-22 LAB — ALPHA-1-ANTITRYPSIN: A-1 Antitrypsin: 193 mg/dL — ABNORMAL HIGH (ref 100–188)

## 2020-11-22 LAB — PROTIME-INR
INR: 0.9 (ref 0.9–1.2)
Prothrombin Time: 9.3 s (ref 9.1–12.0)

## 2020-11-23 ENCOUNTER — Encounter: Payer: Self-pay | Admitting: Women's Health

## 2020-11-23 ENCOUNTER — Ambulatory Visit (INDEPENDENT_AMBULATORY_CARE_PROVIDER_SITE_OTHER): Payer: Medicaid Other

## 2020-11-23 ENCOUNTER — Ambulatory Visit: Payer: Self-pay | Admitting: *Deleted

## 2020-11-23 ENCOUNTER — Other Ambulatory Visit: Payer: Self-pay

## 2020-11-23 DIAGNOSIS — O0992 Supervision of high risk pregnancy, unspecified, second trimester: Secondary | ICD-10-CM

## 2020-11-23 DIAGNOSIS — Z3682 Encounter for antenatal screening for nuchal translucency: Secondary | ICD-10-CM

## 2020-11-23 DIAGNOSIS — O0990 Supervision of high risk pregnancy, unspecified, unspecified trimester: Secondary | ICD-10-CM | POA: Insufficient documentation

## 2020-11-23 DIAGNOSIS — B009 Herpesviral infection, unspecified: Secondary | ICD-10-CM | POA: Insufficient documentation

## 2020-11-23 NOTE — Progress Notes (Signed)
[place] 12+5 wks,measurements c/w dates,CRL 56.83 mm,NT 1.1 mm,NB present,fhr 176 bpm,normal ovaries,posterior placenta

## 2020-11-24 ENCOUNTER — Encounter: Payer: Self-pay | Admitting: Women's Health

## 2020-11-24 ENCOUNTER — Ambulatory Visit: Payer: Self-pay | Admitting: *Deleted

## 2020-11-24 DIAGNOSIS — B009 Herpesviral infection, unspecified: Secondary | ICD-10-CM

## 2020-11-24 DIAGNOSIS — O0991 Supervision of high risk pregnancy, unspecified, first trimester: Secondary | ICD-10-CM

## 2020-11-25 ENCOUNTER — Other Ambulatory Visit: Payer: Self-pay

## 2020-11-25 ENCOUNTER — Ambulatory Visit (HOSPITAL_COMMUNITY)
Admission: RE | Admit: 2020-11-25 | Discharge: 2020-11-25 | Disposition: A | Discharge status: Home / Self Care | Payer: Medicaid Other | Source: Ambulatory Visit | Attending: Obstetrics & Gynecology | Admitting: Obstetrics & Gynecology

## 2020-11-25 DIAGNOSIS — O26619 Liver and biliary tract disorders in pregnancy, unspecified trimester: Secondary | ICD-10-CM | POA: Diagnosis not present

## 2020-11-25 DIAGNOSIS — K831 Obstruction of bile duct: Secondary | ICD-10-CM | POA: Diagnosis present

## 2020-11-26 ENCOUNTER — Other Ambulatory Visit (INDEPENDENT_AMBULATORY_CARE_PROVIDER_SITE_OTHER): Payer: Self-pay | Admitting: *Deleted

## 2020-11-26 DIAGNOSIS — R7989 Other specified abnormal findings of blood chemistry: Secondary | ICD-10-CM

## 2020-12-03 ENCOUNTER — Encounter: Payer: Self-pay | Admitting: General Surgery

## 2020-12-03 ENCOUNTER — Ambulatory Visit (INDEPENDENT_AMBULATORY_CARE_PROVIDER_SITE_OTHER): Payer: Medicaid Other | Admitting: General Surgery

## 2020-12-03 ENCOUNTER — Other Ambulatory Visit: Payer: Self-pay

## 2020-12-03 VITALS — BP 113/73 | HR 90 | Temp 98.3°F | Resp 14 | Ht 62.0 in | Wt 127.0 lb

## 2020-12-03 DIAGNOSIS — Z3492 Encounter for supervision of normal pregnancy, unspecified, second trimester: Secondary | ICD-10-CM

## 2020-12-03 DIAGNOSIS — K8020 Calculus of gallbladder without cholecystitis without obstruction: Secondary | ICD-10-CM

## 2020-12-03 NOTE — Patient Instructions (Signed)
Minimally Invasive Cholecystectomy Minimally invasive cholecystectomy is surgery to remove the gallbladder. The gallbladder is a pear-shaped organ that lies beneath the liver on the right side of the body. The gallbladder stores bile, which is a fluid that helps the body digest fats. Cholecystectomy is often done to treat inflammation of the gallbladder (cholecystitis). This condition is usually caused by a buildup of gallstones (cholelithiasis) in the gallbladder. Gallstones can block the flow of bile, which can result in inflammation and pain. In severe cases, emergency surgery may be required. This procedure is done though small incisions in the abdomen, instead of one large incision. It is also called laparoscopic surgery. A thin scope with a camera (laparoscope) is inserted through one incision. Then surgical instruments are inserted through the other incisions. In some cases, a minimally invasive surgery may need to be changed to a surgery that is done through a larger incision. This is called open surgery. Tell a health care provider about:  Any allergies you have.  All medicines you are taking, including vitamins, herbs, eye drops, creams, and over-the-counter medicines.  Any problems you or family members have had with anesthetic medicines.  Any blood disorders you have.  Any surgeries you have had.  Any medical conditions you have.  Whether you are pregnant or may be pregnant. What are the risks? Generally, this is a safe procedure. However, problems may occur, including:  Infection.  Bleeding.  Allergic reactions to medicines.  Damage to nearby structures or organs.  A stone remaining in the common bile duct. The common bile duct carries bile from the gallbladder into the small intestine.  A bile leak from the cyst duct that is clipped when your gallbladder is removed. What happens before the procedure? Medicines Ask your health care provider about:  Changing or  stopping your regular medicines. This is especially important if you are taking diabetes medicines or blood thinners.  Taking medicines such as aspirin and ibuprofen. These medicines can thin your blood. Do not take these medicines unless your health care provider tells you to take them.  Taking over-the-counter medicines, vitamins, herbs, and supplements. General instructions  Let your health care provider know if you develop a cold or an infection before surgery.  Plan to have someone take you home from the hospital or clinic.  If you will be going home right after the procedure, plan to have someone with you for 24 hours.  Ask your health care provider: ? How your surgery site will be marked. ? What steps will be taken to help prevent infection. These may include:  Removing hair at the surgery site.  Washing skin with a germ-killing soap.  Taking antibiotic medicine. What happens during the procedure?  An IV will be inserted into one of your veins.  You will be given one or both of the following: ? A medicine to help you relax (sedative). ? A medicine to make you fall asleep (general anesthetic).  A breathing tube will be placed in your mouth.  Your surgeon will make several small incisions in your abdomen.  The laparoscope will be inserted through one of the small incisions. The camera on the laparoscope will send images to a monitor in the operating room. This lets your surgeon see inside your abdomen.  A gas will be pumped into your abdomen. This will expand your abdomen to give the surgeon more room to perform the surgery.  Other tools that are needed for the procedure will be inserted through the   other incisions. The gallbladder will be removed through one of the incisions.  Your common bile duct may be examined. If stones are found in the common bile duct, they may be removed.  After your gallbladder has been removed, the incisions will be closed with stitches  (sutures), staples, or skin glue.  Your incisions may be covered with a bandage (dressing). The procedure may vary among health care providers and hospitals.   What happens after the procedure?  Your blood pressure, heart rate, breathing rate, and blood oxygen level will be monitored until you leave the hospital or clinic.  You will be given medicines as needed to control your pain.  If you were given a sedative during the procedure, it can affect you for several hours. Do not drive or operate machinery until your health care provider says that it is safe. Summary  Minimally invasive cholecystectomy, also called laparoscopic cholecystectomy, is surgery to remove the gallbladder using small incisions.  Tell your health care provider about all the medical conditions you have and all the medicines you are taking for those conditions.  Before the procedure, follow instructions about eating or drinking restrictions and changing or stopping medicines.  If you were given a sedative during the procedure, it can affect you for several hours. Do not drive or operate machinery until your health care provider says that it is safe. This information is not intended to replace advice given to you by your health care provider. Make sure you discuss any questions you have with your health care provider. Document Revised: 07/15/2019 Document Reviewed: 07/15/2019 Elsevier Patient Education  2021 Elsevier Inc.  

## 2020-12-04 NOTE — Progress Notes (Signed)
Alicia [name]; [id number]; 02/27/1991   HPI Patient is a 30 year old black female who is at [redacted] weeks gestation with her second pregnancy who was referred to my care by Dr. Elonda Husky for evaluation and treatment of cholelithiasis.  Patient had an episode earlier in her pregnancy where she had significant right upper quadrant abdominal pain and nausea.  She was noted to have significantly elevated liver enzyme test.  Ultrasound the gallbladder revealed a large single stone.  Her liver tests since that episode have normalized.  She is now referred for a second trimester laparoscopic cholecystectomy.  She currently denies any fever, chills, nausea, or vomiting.  She states she has had episodes of fatty intolerance in the past. Past Medical History:  Diagnosis Date  . Anemia   . Blood in urine 12/23/2015  . Contraceptive management 01/22/2015  . Heavy menstrual period 04/28/2014  . HSV-2 infection   . Urinary frequency 12/23/2015  . Urinary tract infection   . Vaginal discharge 12/31/2014  . Vaginal itching 12/31/2014  . Vaginal odor 12/03/2015  . Yeast infection 12/31/2014    Past Surgical History:  Procedure Laterality Date  . DILATION AND CURETTAGE OF UTERUS N/A 06/19/2015   Procedure: SUCTION AND SHARP UTERINE CURETTAGE;  Surgeon: Florian Buff, MD;  Location: [facility];  Service: Gynecology;  Laterality: N/A;  . LIPOMA EXCISION  07/2016   left eye  . NO PAST SURGERIES      Family History  Problem Relation Age of Onset  . Hypertension Mother   . Anemia Mother   . Cancer Maternal Grandmother        breast  . Diabetes Father   . Heart attack Paternal Grandfather     Current Outpatient Medications on File Prior to Visit  Medication Sig Dispense Refill  . ursodiol (ACTIGALL) 300 MG capsule Take 3 capsules (900 mg total) by mouth 3 (three) times daily. 270 capsule 9   No current facility-administered medications on file prior to visit.    Allergies  Allergen Reactions  . Penicillins Hives    Has  patient had a PCN reaction causing immediate rash, facial/tongue/throat swelling, SOB or lightheadedness with hypotension: No Has patient had a PCN reaction causing severe rash involving mucus membranes or skin necrosis: No Has patient had a PCN reaction that required hospitalization No Has patient had a PCN reaction occurring within the last 10 years: No If all of the above answers are "NO", then may proceed with Cephalosporin use.   . Sulfa Antibiotics Hives and Swelling    Social History   Substance and Sexual Activity  Alcohol Use No    Social History   Tobacco Use  Smoking Status Former Smoker  . Packs/day: 0.50  . Years: 2.00  . Pack years: 1.00  . Types: Cigarettes  Smokeless Tobacco Never Used    Review of Systems  Constitutional: Negative.   HENT: Positive for sinus pain.   Eyes: Negative.   Respiratory: Negative.   Cardiovascular: Negative.   Gastrointestinal: Positive for abdominal pain, heartburn and nausea.  Genitourinary: Negative.   Musculoskeletal: Negative.   Skin: Negative.   Neurological: Negative.   Endo/Heme/Allergies: Negative.   Psychiatric/Behavioral: Negative.     Objective   Vitals:   12/03/20 1006  BP: 113/73  Pulse: 90  Resp: 14  Temp: 98.3 F (36.8 C)  SpO2: 98%    Physical Exam Vitals reviewed.  Constitutional:      Appearance: Normal appearance. She is normal weight. She is not ill-appearing.  HENT:     Head: Normocephalic and atraumatic.  Cardiovascular:     Rate and Rhythm: Normal rate and regular rhythm.     Heart sounds: Normal heart sounds. No murmur heard. No friction rub. No gallop.   Pulmonary:     Effort: Pulmonary effort is normal. No respiratory distress.     Breath sounds: Normal breath sounds. No stridor. No wheezing, rhonchi or rales.  Abdominal:     General: There is no distension.     Palpations: Abdomen is soft. There is no mass.     Tenderness: There is no abdominal tenderness. There is no guarding  or rebound.     Hernia: No hernia is present.     Comments: I could not palpate her uterus to assess fundal height.  Skin:    General: Skin is warm and dry.  Neurological:     Mental Status: She is alert and oriented to person, place, and time.   Ultrasound reports reviewed Discussed with Dr. Elonda Husky.  Assessment  Biliary colic, cholelithiasis Intrauterine pregnancy, beginning second trimester Plan   We will not be able to tolerate further episodes of biliary colic, will proceed with laparoscopic cholecystectomy.  The risks and benefits of the procedure including bleeding, infection, hepatobiliary injury, the possibility of an open procedure, and the possibility of miscarriage were fully explained to the patient, who gave informed consent.

## 2020-12-04 NOTE — H&P (Signed)
Alicia [name]; [id number]; Mar 08, 1991   HPI Patient is a 30 year old black female who is at [redacted] weeks gestation with her second pregnancy who was referred to my care by Dr. Elonda Husky for evaluation and treatment of cholelithiasis.  Patient had an episode earlier in her pregnancy where she had significant right upper quadrant abdominal pain and nausea.  She was noted to have significantly elevated liver enzyme test.  Ultrasound the gallbladder revealed a large single stone.  Her liver tests since that episode have normalized.  She is now referred for a second trimester laparoscopic cholecystectomy.  She currently denies any fever, chills, nausea, or vomiting.  She states she has had episodes of fatty intolerance in the past. Past Medical History:  Diagnosis Date  . Anemia   . Blood in urine 12/23/2015  . Contraceptive management 01/22/2015  . Heavy menstrual period 04/28/2014  . HSV-2 infection   . Urinary frequency 12/23/2015  . Urinary tract infection   . Vaginal discharge 12/31/2014  . Vaginal itching 12/31/2014  . Vaginal odor 12/03/2015  . Yeast infection 12/31/2014    Past Surgical History:  Procedure Laterality Date  . DILATION AND CURETTAGE OF UTERUS N/A 06/19/2015   Procedure: SUCTION AND SHARP UTERINE CURETTAGE;  Surgeon: Florian Buff, MD;  Location: [facility];  Service: Gynecology;  Laterality: N/A;  . LIPOMA EXCISION  07/2016   left eye  . NO PAST SURGERIES      Family History  Problem Relation Age of Onset  . Hypertension Mother   . Anemia Mother   . Cancer Maternal Grandmother        breast  . Diabetes Father   . Heart attack Paternal Grandfather     Current Outpatient Medications on File Prior to Visit  Medication Sig Dispense Refill  . ursodiol (ACTIGALL) 300 MG capsule Take 3 capsules (900 mg total) by mouth 3 (three) times daily. 270 capsule 9   No current facility-administered medications on file prior to visit.    Allergies  Allergen Reactions  . Penicillins Hives    Has  patient had a PCN reaction causing immediate rash, facial/tongue/throat swelling, SOB or lightheadedness with hypotension: No Has patient had a PCN reaction causing severe rash involving mucus membranes or skin necrosis: No Has patient had a PCN reaction that required hospitalization No Has patient had a PCN reaction occurring within the last 10 years: No If all of the above answers are "NO", then may proceed with Cephalosporin use.   . Sulfa Antibiotics Hives and Swelling    Social History   Substance and Sexual Activity  Alcohol Use No    Social History   Tobacco Use  Smoking Status Former Smoker  . Packs/day: 0.50  . Years: 2.00  . Pack years: 1.00  . Types: Cigarettes  Smokeless Tobacco Never Used    Review of Systems  Constitutional: Negative.   HENT: Positive for sinus pain.   Eyes: Negative.   Respiratory: Negative.   Cardiovascular: Negative.   Gastrointestinal: Positive for abdominal pain, heartburn and nausea.  Genitourinary: Negative.   Musculoskeletal: Negative.   Skin: Negative.   Neurological: Negative.   Endo/Heme/Allergies: Negative.   Psychiatric/Behavioral: Negative.     Objective   Vitals:   12/03/20 1006  BP: 113/73  Pulse: 90  Resp: 14  Temp: 98.3 F (36.8 C)  SpO2: 98%    Physical Exam Vitals reviewed.  Constitutional:      Appearance: Normal appearance. She is normal weight. She is not ill-appearing.  HENT:     Head: Normocephalic and atraumatic.  Cardiovascular:     Rate and Rhythm: Normal rate and regular rhythm.     Heart sounds: Normal heart sounds. No murmur heard. No friction rub. No gallop.   Pulmonary:     Effort: Pulmonary effort is normal. No respiratory distress.     Breath sounds: Normal breath sounds. No stridor. No wheezing, rhonchi or rales.  Abdominal:     General: There is no distension.     Palpations: Abdomen is soft. There is no mass.     Tenderness: There is no abdominal tenderness. There is no guarding  or rebound.     Hernia: No hernia is present.     Comments: I could not palpate her uterus to assess fundal height.  Skin:    General: Skin is warm and dry.  Neurological:     Mental Status: She is alert and oriented to person, place, and time.   Ultrasound reports reviewed Discussed with Dr. Elonda Husky.  Assessment  Biliary colic, cholelithiasis Intrauterine pregnancy, beginning second trimester Plan   We will not be able to tolerate further episodes of biliary colic, will proceed with laparoscopic cholecystectomy.  The risks and benefits of the procedure including bleeding, infection, hepatobiliary injury, the possibility of an open procedure, and the possibility of miscarriage were fully explained to the patient, who gave informed consent.

## 2020-12-08 ENCOUNTER — Other Ambulatory Visit (HOSPITAL_COMMUNITY)
Admission: RE | Admit: 2020-12-08 | Discharge: 2020-12-08 | Disposition: A | Discharge status: Home / Self Care | Payer: Medicaid Other | Source: Ambulatory Visit | Attending: Obstetrics & Gynecology | Admitting: Obstetrics & Gynecology

## 2020-12-08 ENCOUNTER — Encounter: Payer: Self-pay | Admitting: Women's Health

## 2020-12-08 ENCOUNTER — Ambulatory Visit (INDEPENDENT_AMBULATORY_CARE_PROVIDER_SITE_OTHER): Payer: Medicaid Other | Admitting: Women's Health

## 2020-12-08 ENCOUNTER — Ambulatory Visit: Payer: Medicaid Other | Admitting: *Deleted

## 2020-12-08 ENCOUNTER — Other Ambulatory Visit: Payer: Self-pay

## 2020-12-08 VITALS — BP 115/77 | HR 92 | Wt 123.0 lb

## 2020-12-08 DIAGNOSIS — O0992 Supervision of high risk pregnancy, unspecified, second trimester: Secondary | ICD-10-CM | POA: Diagnosis not present

## 2020-12-08 DIAGNOSIS — Z113 Encounter for screening for infections with a predominantly sexual mode of transmission: Secondary | ICD-10-CM | POA: Insufficient documentation

## 2020-12-08 DIAGNOSIS — Z363 Encounter for antenatal screening for malformations: Secondary | ICD-10-CM

## 2020-12-08 DIAGNOSIS — N898 Other specified noninflammatory disorders of vagina: Secondary | ICD-10-CM | POA: Diagnosis present

## 2020-12-08 DIAGNOSIS — O26612 Liver and biliary tract disorders in pregnancy, second trimester: Secondary | ICD-10-CM

## 2020-12-08 DIAGNOSIS — K8020 Calculus of gallbladder without cholecystitis without obstruction: Secondary | ICD-10-CM | POA: Insufficient documentation

## 2020-12-08 DIAGNOSIS — O26892 Other specified pregnancy related conditions, second trimester: Secondary | ICD-10-CM

## 2020-12-08 DIAGNOSIS — O26619 Liver and biliary tract disorders in pregnancy, unspecified trimester: Secondary | ICD-10-CM

## 2020-12-08 DIAGNOSIS — Z3A14 14 weeks gestation of pregnancy: Secondary | ICD-10-CM | POA: Diagnosis not present

## 2020-12-08 DIAGNOSIS — K831 Obstruction of bile duct: Secondary | ICD-10-CM

## 2020-12-08 LAB — POCT URINALYSIS DIPSTICK OB
Blood, UA: NEGATIVE
Glucose, UA: NEGATIVE
Ketones, UA: NEGATIVE
Leukocytes, UA: NEGATIVE
Nitrite, UA: NEGATIVE

## 2020-12-08 MED ORDER — BLOOD PRESSURE MONITOR MISC: 0 refills | Status: DC

## 2020-12-08 NOTE — Progress Notes (Signed)
INITIAL OBSTETRICAL VISIT Patient name: Alicia [name] MRN [id number]  Date of birth: Mar 02, 1991 Chief Complaint:   Initial Prenatal Visit (discharge)  History of Present Illness:   DEL WISEMAN is a 30 y.o. G26P1011 African American female at [redacted]w[redacted]d by LMP c/w u/s at 7 weeks with an Estimated Date of Delivery: 06/02/21 being seen today for her initial obstetrical visit.   Her obstetrical history is significant for term SVB x1 w/ cholestasis, AB x 1.    Dx w/ severe cholestasis this pregnancy at 10wks, bile acids 209 and LFTs 448/1,186, neg Hep panel, started on actigall 900mg  TID and referred to GI. 2cm gallstone, has cholecystectomy w/ Dr. Arnoldo Morale on 2/23. Still itching. States Dr. Dereck Leep said he would start her on something else after 14wks, doesn't f/u w/ him until after surgery.  Today she reports vaginal d/c, spotting, odor.  Depression screen [facility] 2/9 12/08/2020 01/28/2020 03/07/2018 11/02/2017  Decreased Interest 0 0 0 0  Down, Depressed, Hopeless 0 0 0 0  PHQ - 2 Score 0 0 0 0  Altered sleeping 3 0 - -  Tired, decreased energy 0 0 - -  Change in appetite 2 0 - -  Feeling bad or failure about yourself  0 0 - -  Trouble concentrating 0 0 - -  Moving slowly or fidgety/restless 0 0 - -  Suicidal thoughts 0 0 - -  PHQ-9 Score 5 0 - -    Patient's last menstrual period was 08/26/2020 (exact date). Last pap 01/28/20. Results were: NILM w/ HRHPV negative Review of Systems:   Pertinent items are noted in HPI Denies cramping/contractions, leakage of fluid, vaginal bleeding, abnormal vaginal discharge w/ itching/odor/irritation, headaches, visual changes, shortness of breath, chest pain, abdominal pain, severe nausea/vomiting, or problems with urination or bowel movements unless otherwise stated above.  Pertinent History Reviewed:  Reviewed past medical,surgical, social, obstetrical and family history.  Reviewed problem list, medications and allergies. OB History  Gravida Para Term  Preterm AB Living  3 1 1  0 1 1  SAB IAB Ectopic Multiple Live Births  0 1 0   1    # Outcome Date GA Lbr Len/2nd Weight Sex Delivery Anes PTL Lv  3 Current           2 IAB 04/29/15 [redacted]w[redacted]d         1 Term 03/08/14 [redacted]w[redacted]d 11:05 / 07:02 5 lb 11.9 oz (2.605 kg) M Vag-Spont EPI  LIV     Complications: Cholestasis    Obstetric Comments  TAB 16 weeks   Physical Assessment:   Vitals:   12/08/20 1145  BP: 115/77  Pulse: 92  Weight: 123 lb (55.8 kg)  Body mass index is 22.5 kg/m.       Physical Examination:  General appearance - well appearing, and in no distress  Mental status - alert, oriented to person, place, and time  Psych:  She has a normal mood and affect  Skin - warm and dry, normal color, no suspicious lesions noted  Chest - effort normal, all lung fields clear to auscultation bilaterally  Heart - normal rate and regular rhythm  Abdomen - soft, nontender  Extremities:  No swelling or varicosities noted  Pelvic - VULVA: normal appearing vulva with no masses, tenderness or lesions  VAGINA: normal appearing vagina with normal color and light pink thin discharge, no lesions  CERVIX: normal appearing cervix without discharge or lesions, no CMT  Thin prep pap is not done  Chaperone:  Angel Neas    TODAY'S FHR: 161 via doppler  Results for orders placed or performed in visit on 12/08/20 (from the past 24 hour(s))  POC Urinalysis Dipstick OB   Collection Time: 12/08/20 12:11 PM  Result Value Ref Range   Color, UA     Clarity, UA     Glucose, UA Negative Negative   Bilirubin, UA     Ketones, UA neg    Spec Grav, UA     Blood, UA neg    pH, UA     POC,PROTEIN,UA Small (1+) Negative, Trace, Small (1+), Moderate (2+), Large (3+), 4+   Urobilinogen, UA     Nitrite, UA neg    Leukocytes, UA Negative Negative   Appearance     Odor      Assessment & Plan:  1) High-Risk Pregnancy G3P1011 at [redacted]w[redacted]d with an Estimated Date of Delivery: 06/02/21   2) Initial OB visit  3) Severe  cholestasis dx @ 10wks> on actigall 900mg  TID, still itching. To contact Dr. Dereck Leep about medicine he was going to give her at 14wks. Repeat labs today, is fasting.   4) Cholelithiasis> has cholecystectomy 2/23 w/ Dr. Arnoldo Morale  5) Vaginal d/c> CV swab sent, is light pink- so pelvic rest for now  Meds:  Meds ordered this encounter  Medications  . Blood Pressure Monitor MISC    Sig: For regular home bp monitoring during pregnancy    Dispense:  1 each    Refill:  0    O09.92    Initial labs obtained Continue prenatal vitamins Reviewed n/v relief measures and warning s/s to report Reviewed recommended weight gain based on pre-gravid BMI Encouraged well-balanced diet Genetic & carrier screening discussed: requests Panorama and AFP, declines Horizon 14 . Had normal NT u/s but did not get 1st IT drawn.   Ultrasound discussed; fetal survey: requested [place] completed> form faxed if has or is planning to apply for medicaid The nature of [place] for [employer] with multiple MDs and other Advanced Practice Providers was explained to patient; also emphasized that fellows, residents, and students are part of our team. Does not have home bp cuff. Rx faxed to [facility]. Check bp weekly, let us know if >140/90.   Follow-up: Return in about 22 days (around 12/30/2020) for HROB, VQ:MGQQPYP, AFP, in person, CNM/MD.   Orders Placed This Encounter  Procedures  . Urine Culture  . US OB Comp + 14 Wk  . Genetic Screening  . Pain Management Screening Profile (10S)  . CBC/D/Plt+RPR+Rh+ABO+Rub Ab...  . Comprehensive metabolic panel  . Bile acids, total  . POC Urinalysis Dipstick OB    Roma Schanz CNM, [facility] 12/08/2020 12:38 PM

## 2020-12-08 NOTE — Patient Instructions (Signed)
Alicia [name], I greatly value your feedback.  If you receive a survey following your visit with [place] today, we appreciate you taking the time to fill it out.  Thanks, Knute Neu, CNM, WHNP-BC  [facility] [place] [facility] ([place], [place] [address]) Entrance C, located off of [address] parking  Go to [email or web address] to register for FREE online childbirth classes  [place] Pediatricians/Family Doctors:  [facility] [place] [phone]                 [place] [phone] (usually not accepting new patients unless you have family there already, you are always welcome to call and ask)       [facility] Department [phone]       [facility] Pediatricians/Family Doctors:   [facility]: [phone]  [facility]: [phone]  [facility]: [place] Doctors:   [facility]: [facility]: [place]:  [place]: [phone]    Home Blood Pressure Monitoring for Patients   Your provider has recommended that you check your blood pressure (BP) at least once a week at home. If you do not have a blood pressure cuff at home, one will be provided for you. Contact your provider if you have not received your monitor within 1 week.   Helpful Tips for Accurate Home Blood Pressure Checks  . Don't smoke, exercise, or drink caffeine 30 minutes before checking your BP . Use the restroom before checking your BP (a full bladder can raise your pressure) . Relax in a comfortable upright chair . Feet on the ground . Left arm resting comfortably on a flat surface at the level of your heart . Legs uncrossed . Back supported . Sit quietly and don't talk . Place the cuff on your bare arm . Adjust snuggly, so  that only two fingertips can fit between your skin and the top of the cuff . Check 2 readings separated by at least one minute . Keep a log of your BP readings . For a visual, please reference this diagram: http://ccnc.care/bpdiagram  Provider Name: [facility]     Phone: [phone]  Zone 1: ALL CLEAR  Continue to monitor your symptoms:  . BP reading is less than 140 (top number) or less than 90 (bottom number)  . No right upper stomach pain . No headaches or seeing spots . No feeling nauseated or throwing up . No swelling in face and hands  Zone 2: CAUTION Call your doctor's office for any of the following:  . BP reading is greater than 140 (top number) or greater than 90 (bottom number)  . Stomach pain under your ribs in the middle or right side . Headaches or seeing spots . Feeling nauseated or throwing up . Swelling in face and hands  Zone 3: EMERGENCY  Seek immediate medical care if you have any of the following:  . BP reading is greater than160 (top number) or greater than 110 (bottom number) . Severe headaches not improving with Tylenol . Serious difficulty catching your breath . Any worsening symptoms from Zone 2     Second Trimester of Pregnancy The second trimester is from week 14 through week 27 (months 4 through 6). The second trimester is often a time when you feel your  best. Your body has adjusted to being pregnant, and you begin to feel better physically. Usually, morning sickness has lessened or quit completely, you may have more energy, and you may have an increase in appetite. The second trimester is also a time when the fetus is growing rapidly. At the end of the sixth month, the fetus is about 9 inches long and weighs about 1 pounds. You will likely begin to feel the baby move (quickening) between 16 and 20 weeks of pregnancy. Body changes during your second trimester Your body continues to go through many changes during your second trimester. The  changes vary from woman to woman.  Your weight will continue to increase. You will notice your lower abdomen bulging out.  You may begin to get stretch marks on your hips, abdomen, and breasts.  You may develop headaches that can be relieved by medicines. The medicines should be approved by your health care provider.  You may urinate more often because the fetus is pressing on your bladder.  You may develop or continue to have heartburn as a result of your pregnancy.  You may develop constipation because certain hormones are causing the muscles that push waste through your intestines to slow down.  You may develop hemorrhoids or swollen, bulging veins (varicose veins).  You may have back pain. This is caused by: ? Weight gain. ? Pregnancy hormones that are relaxing the joints in your pelvis. ? A shift in weight and the muscles that support your balance.  Your breasts will continue to grow and they will continue to become tender.  Your gums may bleed and may be sensitive to brushing and flossing.  Dark spots or blotches (chloasma, mask of pregnancy) may develop on your face. This will likely fade after the baby is born.  A dark line from your belly button to the pubic area (linea nigra) may appear. This will likely fade after the baby is born.  You may have changes in your hair. These can include thickening of your hair, rapid growth, and changes in texture. Some women also have hair loss during or after pregnancy, or hair that feels dry or thin. Your hair will most likely return to normal after your baby is born.  What to expect at prenatal visits During a routine prenatal visit:  You will be weighed to make sure you and the fetus are growing normally.  Your blood pressure will be taken.  Your abdomen will be measured to track your baby's growth.  The fetal heartbeat will be listened to.  Any test results from the previous visit will be discussed.  Your health care  provider may ask you:  How you are feeling.  If you are feeling the baby move.  If you have had any abnormal symptoms, such as leaking fluid, bleeding, severe headaches, or abdominal cramping.  If you are using any tobacco products, including cigarettes, chewing tobacco, and electronic cigarettes.  If you have any questions.  Other tests that may be performed during your second trimester include:  Blood tests that check for: ? Low iron levels (anemia). ? High blood sugar that affects pregnant women (gestational diabetes) between 71 and 28 weeks. ? Rh antibodies. This is to check for a protein on red blood cells (Rh factor).  Urine tests to check for infections, diabetes, or protein in the urine.  An ultrasound to confirm the proper growth and development of the baby.  An amniocentesis to check for possible genetic problems.  Fetal  screens for spina bifida and Down syndrome.  HIV (human immunodeficiency virus) testing. Routine prenatal testing includes screening for HIV, unless you choose not to have this test.  Follow these instructions at home: Medicines  Follow your health care provider's instructions regarding medicine use. Specific medicines may be either safe or unsafe to take during pregnancy.  Take a prenatal vitamin that contains at least 600 micrograms (mcg) of folic acid.  If you develop constipation, try taking a stool softener if your health care provider approves. Eating and drinking  Eat a balanced diet that includes fresh fruits and vegetables, whole grains, good sources of protein such as meat, eggs, or tofu, and low-fat dairy. Your health care provider will help you determine the amount of weight gain that is right for you.  Avoid raw meat and uncooked cheese. These carry germs that can cause birth defects in the baby.  If you have low calcium intake from food, talk to your health care provider about whether you should take a daily calcium  supplement.  Limit foods that are high in fat and processed sugars, such as fried and sweet foods.  To prevent constipation: ? Drink enough fluid to keep your urine clear or pale yellow. ? Eat foods that are high in fiber, such as fresh fruits and vegetables, whole grains, and beans. Activity  Exercise only as directed by your health care provider. Most women can continue their usual exercise routine during pregnancy. Try to exercise for 30 minutes at least 5 days a week. Stop exercising if you experience uterine contractions.  Avoid heavy lifting, wear low heel shoes, and practice good posture.  A sexual relationship may be continued unless your health care provider directs you otherwise. Relieving pain and discomfort  Wear a good support bra to prevent discomfort from breast tenderness.  Take warm sitz baths to soothe any pain or discomfort caused by hemorrhoids. Use hemorrhoid cream if your health care provider approves.  Rest with your legs elevated if you have leg cramps or low back pain.  If you develop varicose veins, wear support hose. Elevate your feet for 15 minutes, 3-4 times a day. Limit salt in your diet. Prenatal Care  Write down your questions. Take them to your prenatal visits.  Keep all your prenatal visits as told by your health care provider. This is important. Safety  Wear your seat belt at all times when driving.  Make a list of emergency phone numbers, including numbers for family, friends, the hospital, and police and fire departments. General instructions  Ask your health care provider for a referral to a local prenatal education class. Begin classes no later than the beginning of month 6 of your pregnancy.  Ask for help if you have counseling or nutritional needs during pregnancy. Your health care provider can offer advice or refer you to specialists for help with various needs.  Do not use hot tubs, steam rooms, or saunas.  Do not douche or use  tampons or scented sanitary pads.  Do not cross your legs for long periods of time.  Avoid cat litter boxes and soil used by cats. These carry germs that can cause birth defects in the baby and possibly loss of the fetus by miscarriage or stillbirth.  Avoid all smoking, herbs, alcohol, and unprescribed drugs. Chemicals in these products can affect the formation and growth of the baby.  Do not use any products that contain nicotine or tobacco, such as cigarettes and e-cigarettes. If you need help  quitting, ask your health care provider.  Visit your dentist if you have not gone yet during your pregnancy. Use a soft toothbrush to brush your teeth and be gentle when you floss. Contact a health care provider if:  You have dizziness.  You have mild pelvic cramps, pelvic pressure, or nagging pain in the abdominal area.  You have persistent nausea, vomiting, or diarrhea.  You have a bad smelling vaginal discharge.  You have pain when you urinate. Get help right away if:  You have a fever.  You are leaking fluid from your vagina.  You have spotting or bleeding from your vagina.  You have severe abdominal cramping or pain.  You have rapid weight gain or weight loss.  You have shortness of breath with chest pain.  You notice sudden or extreme swelling of your face, hands, ankles, feet, or legs.  You have not felt your baby move in over an hour.  You have severe headaches that do not go away when you take medicine.  You have vision changes. Summary  The second trimester is from week 14 through week 27 (months 4 through 6). It is also a time when the fetus is growing rapidly.  Your body goes through many changes during pregnancy. The changes vary from woman to woman.  Avoid all smoking, herbs, alcohol, and unprescribed drugs. These chemicals affect the formation and growth your baby.  Do not use any tobacco products, such as cigarettes, chewing tobacco, and e-cigarettes. If you  need help quitting, ask your health care provider.  Contact your health care provider if you have any questions. Keep all prenatal visits as told by your health care provider. This is important. This information is not intended to replace advice given to you by your health care provider. Make sure you discuss any questions you have with your health care provider. Document Released: 10/04/2001 Document Revised: 03/17/2016 Document Reviewed: 12/11/2012 Elsevier Interactive Patient Education  2017 [employer]   

## 2020-12-09 ENCOUNTER — Telehealth (INDEPENDENT_AMBULATORY_CARE_PROVIDER_SITE_OTHER): Payer: Self-pay | Admitting: Internal Medicine

## 2020-12-09 ENCOUNTER — Other Ambulatory Visit: Payer: Self-pay | Admitting: Women's Health

## 2020-12-09 ENCOUNTER — Encounter: Payer: Self-pay | Admitting: Women's Health

## 2020-12-09 DIAGNOSIS — A599 Trichomoniasis, unspecified: Secondary | ICD-10-CM | POA: Insufficient documentation

## 2020-12-09 LAB — CERVICOVAGINAL ANCILLARY ONLY
Bacterial Vaginitis (gardnerella): POSITIVE — AB
Candida Glabrata: NEGATIVE
Candida Vaginitis: NEGATIVE
Chlamydia: NEGATIVE
Comment: NEGATIVE
Comment: NEGATIVE
Comment: NEGATIVE
Comment: NEGATIVE
Comment: NEGATIVE
Comment: NORMAL
Neisseria Gonorrhea: NEGATIVE
Trichomonas: POSITIVE — AB

## 2020-12-09 LAB — CBC/D/PLT+RPR+RH+ABO+RUB AB...
Antibody Screen: NEGATIVE
Basophils Absolute: 0.1 10*3/uL (ref 0.0–0.2)
Basos: 1 %
EOS (ABSOLUTE): 0.1 10*3/uL (ref 0.0–0.4)
Eos: 1 %
HCV Ab: <0.1 s/co ratio (ref 0.0–0.9)
HIV Screen 4th Generation wRfx: NONREACTIVE
Hematocrit: 38.9 % (ref 34.0–46.6)
Hemoglobin: 13.4 g/dL (ref 11.1–15.9)
Hepatitis B Surface Ag: NEGATIVE
Immature Grans (Abs): 0.1 10*3/uL (ref 0.0–0.1)
Immature Granulocytes: 1 %
Lymphocytes Absolute: 1.7 10*3/uL (ref 0.7–3.1)
Lymphs: 21 %
MCH: 30 pg (ref 26.6–33.0)
MCHC: 34.4 g/dL (ref 31.5–35.7)
MCV: 87 fL (ref 79–97)
Monocytes Absolute: 0.4 10*3/uL (ref 0.1–0.9)
Monocytes: 6 %
Neutrophils Absolute: 5.6 10*3/uL (ref 1.4–7.0)
Neutrophils: 70 %
Platelets: 278 10*3/uL (ref 150–450)
RBC: 4.46 x10E6/uL (ref 3.77–5.28)
RDW: 14.2 % (ref 11.7–15.4)
RPR Ser Ql: NONREACTIVE
Rh Factor: POSITIVE
Rubella Antibodies, IGG: 7.81 index (ref >0.99)
WBC: 7.9 10*3/uL (ref 3.4–10.8)

## 2020-12-09 LAB — PMP SCREEN PROFILE (10S), URINE
Amphetamine Scrn, Ur: NEGATIVE ng/mL
BARBITURATE SCREEN URINE: NEGATIVE ng/mL
BENZODIAZEPINE SCREEN, URINE: NEGATIVE ng/mL
CANNABINOIDS UR QL SCN: POSITIVE ng/mL — AB
Cocaine (Metab) Scrn, Ur: NEGATIVE ng/mL
Creatinine(Crt), U: 238.1 mg/dL (ref 20.0–300.0)
Methadone Screen, Urine: NEGATIVE ng/mL
OXYCODONE+OXYMORPHONE UR QL SCN: NEGATIVE ng/mL
Opiate Scrn, Ur: NEGATIVE ng/mL
Ph of Urine: 6.3 (ref 4.5–8.9)
Phencyclidine Qn, Ur: NEGATIVE ng/mL
Propoxyphene Scrn, Ur: NEGATIVE ng/mL

## 2020-12-09 LAB — HCV INTERPRETATION

## 2020-12-09 MED ORDER — METRONIDAZOLE 500 MG PO TABS: 2000.0000 mg | ORAL_TABLET | Freq: Once | ORAL | 0 refills | Status: AC

## 2020-12-09 NOTE — Telephone Encounter (Signed)
Patient called stated when she saw Dr Laural Golden a few weeks ago he stated he would send her in something for itching and she is at [redacted] weeks pregnant now - please advise - ph# [phone]

## 2020-12-09 NOTE — Telephone Encounter (Signed)
I called and left a vm asked that the patient call us back with more information.

## 2020-12-10 ENCOUNTER — Telehealth: Payer: Self-pay | Admitting: Women's Health

## 2020-12-10 ENCOUNTER — Other Ambulatory Visit (INDEPENDENT_AMBULATORY_CARE_PROVIDER_SITE_OTHER): Payer: Self-pay | Admitting: Internal Medicine

## 2020-12-10 ENCOUNTER — Other Ambulatory Visit (INDEPENDENT_AMBULATORY_CARE_PROVIDER_SITE_OTHER): Payer: Self-pay | Admitting: *Deleted

## 2020-12-10 DIAGNOSIS — R7989 Other specified abnormal findings of blood chemistry: Secondary | ICD-10-CM

## 2020-12-10 LAB — COMPREHENSIVE METABOLIC PANEL
ALT: 338 IU/L — ABNORMAL HIGH (ref 0–32)
AST: 156 IU/L — ABNORMAL HIGH (ref 0–40)
Albumin/Globulin Ratio: 1.3 (ref 1.2–2.2)
Albumin: 4.4 g/dL (ref 3.9–5.0)
Alkaline Phosphatase: 156 IU/L — ABNORMAL HIGH (ref 44–121)
BUN/Creatinine Ratio: 9 (ref 9–23)
BUN: 5 mg/dL — ABNORMAL LOW (ref 6–20)
Bilirubin Total: 0.6 mg/dL (ref 0.0–1.2)
CO2: 20 mmol/L (ref 20–29)
Calcium: 10 mg/dL (ref 8.7–10.2)
Chloride: 100 mmol/L (ref 96–106)
Creatinine, Ser: 0.58 mg/dL (ref 0.57–1.00)
GFR calc Af Amer: 144 mL/min/{1.73_m2} (ref >59)
GFR calc non Af Amer: 125 mL/min/{1.73_m2} (ref >59)
Globulin, Total: 3.4 g/dL (ref 1.5–4.5)
Glucose: 86 mg/dL (ref 65–99)
Potassium: 4.2 mmol/L (ref 3.5–5.2)
Sodium: 137 mmol/L (ref 134–144)
Total Protein: 7.8 g/dL (ref 6.0–8.5)

## 2020-12-10 LAB — BILE ACIDS, TOTAL: Bile Acids Total: 173 umol/L — ABNORMAL HIGH (ref 0.0–10.0)

## 2020-12-10 MED ORDER — CHOLESTYRAMINE 4 G PO PACK: 4.0000 g | PACK | Freq: Two times a day (BID) | ORAL | 1 refills | Status: DC

## 2020-12-10 MED ORDER — HYDROXYZINE HCL 25 MG PO TABS: 25.0000 mg | ORAL_TABLET | Freq: Three times a day (TID) | ORAL | 1 refills | Status: DC | PRN

## 2020-12-10 NOTE — Telephone Encounter (Signed)
[facility] to confirm pt's meds were filled. They stated that Alicia [name] had picked up her Flagyl yesterday. I asked about her partner and it is ready to be picked up. Called the pt and informed her that it is ready. Pt confirmed understanding.

## 2020-12-10 NOTE — Telephone Encounter (Signed)
Patient stated that 2 prescriptions has not been sent over to her Pharmacy ( didn't clarify the prescriptions needed). Patient did receive the BP Monitor.. Clinical staff will follow up with patient.

## 2020-12-10 NOTE — Progress Notes (Signed)
Patient called stating that she is having intractable pruritus.  She is unable to sleep We will start patient on cholestyramine 4 g p.o. twice daily and hydroxyzine 25 mg p.o. 3 times daily as needed.  Both prescription sent to [facility] Patient advised not to take other medications for 2 hours before and after taking cholestyramine. She was informed that hydroxyzine may make her sleepy in which case she can take half the dose. Please note that I had talk with Dr. Elonda Husky 3 weeks ago about using these medications and he was in agreement. Patient is now at week 73 of her pregnancy

## 2020-12-10 NOTE — Telephone Encounter (Signed)
I spoke with the patient she states she is having a lot of itching due to Cholestasis all over and states you had discussed with her prescribing something after she got past fourteen weeks pregnancy. She uses [facility]. Please advise.

## 2020-12-11 LAB — URINE CULTURE

## 2020-12-11 NOTE — Telephone Encounter (Signed)
Doctor Rehman talked with the patient and he has prescribed Hydroxyzine 25 mg -patient is taking 1 by mouth 3 days as needed for itching. #30 1 refill.

## 2020-12-11 NOTE — Pre-Procedure Instructions (Signed)
Liver biopsy was on booking but not on consent. I talked to Dr Arnoldo Morale who wanted liver biopsy added to order for consent.

## 2020-12-11 NOTE — Patient Instructions (Signed)
[place]  12/11/2020     @PREFPERIOPPHARMACY @   Your procedure is scheduled on  12/16/2020.   Report to [facility] at  0700  A.M.   Call this number if you have problems the morning of surgery:  [phone]   Remember:  Do not eat or drink after midnight.                          Take these medicines the morning of surgery with A SIP OF WATER    Benadryl, hydroxyzine, actigall.    Shower with CHG the night before and the morning of your procedure. DO NOT put CHG on your face, hair or genitals.   After your night shower, dry off with a clean towel, put on clean clothes to sleep in and place clean sheets on your bed before you sleep.                       DO NOT sleep wit pets this night.   After your morning shower, dry off with a clean towel, put on clean comfortable clothes and brush your teeth before you come to the hospital.      Do not wear jewelry, make-up or nail polish.  Do not wear lotions, powders, or perfumes, or deodorant.  Do not shave 48 hours prior to surgery.  Men may shave face and neck.  Do not bring valuables to the hospital.  [facility] is not responsible for any belongings or valuables.  Contacts, dentures or bridgework may not be worn into surgery.  Leave your suitcase in the car.  After surgery it may be brought to your room.  For patients admitted to the hospital, discharge time will be determined by your treatment team.  Patients discharged the day of surgery will not be allowed to drive home and must have someone with them for 24 hours.   Special instructions:  DO NOT smoke tobacco or vape the morning of your procedure.    Please read over the following fact sheets that you were given. Coughing and Deep Breathing, Surgical Site Infection Prevention, Anesthesia Post-op Instructions and Care and Recovery After Surgery       Minimally Invasive Cholecystectomy Minimally invasive cholecystectomy is surgery to remove  the gallbladder. The gallbladder is a pear-shaped organ that lies beneath the liver on the right side of the body. The gallbladder stores bile, which is a fluid that helps the body digest fats. Cholecystectomy is often done to treat inflammation of the gallbladder (cholecystitis). This condition is usually caused by a buildup of gallstones (cholelithiasis) in the gallbladder. Gallstones can block the flow of bile, which can result in inflammation and pain. In severe cases, emergency surgery may be required. This procedure is done though small incisions in the abdomen, instead of one large incision. It is also called laparoscopic surgery. A thin scope with a camera (laparoscope) is inserted through one incision. Then surgical instruments are inserted through the other incisions. In some cases, a minimally invasive surgery may need to be changed to a surgery that is done through a larger incision. This is called open surgery. Tell a health care provider about:  Any allergies you have.  All medicines you are taking, including vitamins, herbs, eye drops, creams, and over-the-counter medicines.  Any problems you or family members have had with anesthetic medicines.  Any blood disorders you have.  Any surgeries you have had.  Any medical conditions you have.  Whether you are pregnant or may be pregnant. What are the risks? Generally, this is a safe procedure. However, problems may occur, including:  Infection.  Bleeding.  Allergic reactions to medicines.  Damage to nearby structures or organs.  A stone remaining in the common bile duct. The common bile duct carries bile from the gallbladder into the small intestine.  A bile leak from the cyst duct that is clipped when your gallbladder is removed. What happens before the procedure? Staying hydrated Follow instructions from your health care provider about hydration, which may include:  Up to 2 hours before the procedure - you may continue  to drink clear liquids, such as water, clear fruit juice, black coffee, and plain tea.   Eating and drinking restrictions Follow instructions from your health care provider about eating and drinking, which may include:  8 hours before the procedure - stop eating heavy meals or foods, such as meat, fried foods, or fatty foods.  6 hours before the procedure - stop eating light meals or foods, such as toast or cereal.  6 hours before the procedure - stop drinking milk or drinks that contain milk.  2 hours before the procedure - stop drinking clear liquids. Medicines Ask your health care provider about:  Changing or stopping your regular medicines. This is especially important if you are taking diabetes medicines or blood thinners.  Taking medicines such as aspirin and ibuprofen. These medicines can thin your blood. Do not take these medicines unless your health care provider tells you to take them.  Taking over-the-counter medicines, vitamins, herbs, and supplements. General instructions  Let your health care provider know if you develop a cold or an infection before surgery.  Plan to have someone take you home from the hospital or clinic.  If you will be going home right after the procedure, plan to have someone with you for 24 hours.  Ask your health care provider: ? How your surgery site will be marked. ? What steps will be taken to help prevent infection. These may include:  Removing hair at the surgery site.  Washing skin with a germ-killing soap.  Taking antibiotic medicine. What happens during the procedure?  An IV will be inserted into one of your veins.  You will be given one or both of the following: ? A medicine to help you relax (sedative). ? A medicine to make you fall asleep (general anesthetic).  A breathing tube will be placed in your mouth.  Your surgeon will make several small incisions in your abdomen.  The laparoscope will be inserted through one of  the small incisions. The camera on the laparoscope will send images to a monitor in the operating room. This lets your surgeon see inside your abdomen.  A gas will be pumped into your abdomen. This will expand your abdomen to give the surgeon more room to perform the surgery.  Other tools that are needed for the procedure will be inserted through the other incisions. The gallbladder will be removed through one of the incisions.  Your common bile duct may be examined. If stones are found in the common bile duct, they may be removed.  After your gallbladder has been removed, the incisions will be closed with stitches (sutures), staples, or skin glue.  Your incisions may be covered with a bandage (dressing). The procedure may vary among health care providers and hospitals.   What happens after the  procedure?  Your blood pressure, heart rate, breathing rate, and blood oxygen level will be monitored until you leave the hospital or clinic.  You will be given medicines as needed to control your pain.  If you were given a sedative during the procedure, it can affect you for several hours. Do not drive or operate machinery until your health care provider says that it is safe. Summary  Minimally invasive cholecystectomy, also called laparoscopic cholecystectomy, is surgery to remove the gallbladder using small incisions.  Tell your health care provider about all the medical conditions you have and all the medicines you are taking for those conditions.  Before the procedure, follow instructions about eating or drinking restrictions and changing or stopping medicines.  If you were given a sedative during the procedure, it can affect you for several hours. Do not drive or operate machinery until your health care provider says that it is safe. This information is not intended to replace advice given to you by your health care provider. Make sure you discuss any questions you have with your health care  provider. Document Revised: 07/15/2019 Document Reviewed: 07/15/2019 Elsevier Patient Education  2021 [place].  Liver Biopsy, Care After These instructions give you information on caring for yourself after your procedure. Your doctor may also give you more specific instructions. Call your doctor if you have any problems or questions after your procedure. What can I expect after the procedure? After the procedure, it is common to have:  Pain and soreness where the biopsy was done.  Bruising around the area where the biopsy was done.  Sleepiness and be tired for a few days. Follow these instructions at home: Medicines  Take over-the-counter and prescription medicines only as told by your doctor.  If you were prescribed an antibiotic medicine, take it as told by your doctor. Do not stop taking the antibiotic even if you start to feel better.  Do not take medicines such as aspirin and ibuprofen. These medicines can thin your blood. Do not take these medicines unless your doctor tells you to take them.  If you are taking prescription pain medicine, take actions to prevent or treat constipation. Your doctor may recommend that you: ? Drink enough fluid to keep your pee (urine) clear or pale yellow. ? Take over-the-counter or prescription medicines. ? Eat foods that are high in fiber, such as fresh fruits and vegetables, whole grains, and beans. ? Limit foods that are high in fat and processed sugars, such as fried and sweet foods. Caring for your cut  Follow instructions from your doctor about how to take care of your cuts from surgery (incisions). Make sure you: ? Wash your hands with soap and water before you change your bandage (dressing). If you cannot use soap and water, use hand sanitizer. ? Change your bandage as told by your doctor. ? Leave stitches (sutures), skin glue, or skin tape (adhesive) strips in place. They may need to stay in place for 2 weeks or longer. If tape  strips get loose and curl up, you may trim the loose edges. Do not remove tape strips completely unless your doctor says it is okay.  Check your cuts every day for signs of infection. Check for: ? Redness, swelling, or more pain. ? Fluid or blood. ? Pus or a bad smell. ? Warmth.  Do not take baths, swim, or use a hot tub until your doctor says it is okay to do so. Activity  Rest at home for  1-2 days or as told by your doctor. ? Avoid sitting for a long time without moving. Get up to take short walks every 1-2 hours.  Return to your normal activities as told by your doctor. Ask what activities are safe for you.  Do not do these things in the first 24 hours: ? Drive. ? Use machinery. ? Take a bath or shower.  Do not lift more than 10 pounds (4.5 kg) or play contact sports for the first 2 weeks.   General instructions  Do not drink alcohol in the first week after the procedure.  Have someone stay with you for at least 24 hours after the procedure.  Get your test results. Ask your doctor or the department that is doing the test: ? When will my results be ready? ? How will I get my results? ? What are my treatment options? ? What other tests do I need? ? What are my next steps?  Keep all follow-up visits as told by your doctor. This is important.   Contact a doctor if:  A cut bleeds and leaves more than just a small spot of blood.  A cut is red, puffs up (swells), or hurts more than before.  Fluid or something else comes from a cut.  A cut smells bad.  You have a fever or chills. Get help right away if:  You have swelling, bloating, or pain in your belly (abdomen).  You get dizzy or faint.  You have a rash.  You feel sick to your stomach (nauseous) or throw up (vomit).  You have trouble breathing, feel short of breath, or feel faint.  Your chest hurts.  You have problems talking or seeing.  You have trouble with your balance or moving your arms or  legs. Summary  After the procedure, it is common to have pain, soreness, bruising, and tiredness.  Your doctor will tell you how to take care of yourself at home. Change your bandage, take your medicines, and limit your activities as told by your doctor.  Call your doctor if you have symptoms of infection. Get help right away if your belly swells, your cut bleeds a lot, or you have trouble talking or breathing. This information is not intended to replace advice given to you by your health care provider. Make sure you discuss any questions you have with your health care provider. Document Revised: 10/19/2017 Document Reviewed: 10/20/2017 Elsevier Patient Education  2021 [place] Anesthesia, Adult, Care After This sheet gives you information about how to care for yourself after your procedure. Your health care provider may also give you more specific instructions. If you have problems or questions, contact your health care provider. What can I expect after the procedure? After the procedure, the following side effects are common:  Pain or discomfort at the IV site.  Nausea.  Vomiting.  Sore throat.  Trouble concentrating.  Feeling cold or chills.  Feeling weak or tired.  Sleepiness and fatigue.  Soreness and body aches. These side effects can affect parts of the body that were not involved in surgery. Follow these instructions at home: For the time period you were told by your health care provider:  Rest.  Do not participate in activities where you could fall or become injured.  Do not drive or use machinery.  Do not drink alcohol.  Do not take sleeping pills or medicines that cause drowsiness.  Do not make important decisions or sign legal documents.  Do  not take care of children on your own.   Eating and drinking  Follow any instructions from your health care provider about eating or drinking restrictions.  When you feel hungry, start by eating  small amounts of foods that are soft and easy to digest (bland), such as toast. Gradually return to your regular diet.  Drink enough fluid to keep your urine pale yellow.  If you vomit, rehydrate by drinking water, juice, or clear broth. General instructions  If you have sleep apnea, surgery and certain medicines can increase your risk for breathing problems. Follow instructions from your health care provider about wearing your sleep device: ? Anytime you are sleeping, including during daytime naps. ? While taking prescription pain medicines, sleeping medicines, or medicines that make you drowsy.  Have a responsible adult stay with you for the time you are told. It is important to have someone help care for you until you are awake and alert.  Return to your normal activities as told by your health care provider. Ask your health care provider what activities are safe for you.  Take over-the-counter and prescription medicines only as told by your health care provider.  If you smoke, do not smoke without supervision.  Keep all follow-up visits as told by your health care provider. This is important. Contact a health care provider if:  You have nausea or vomiting that does not get better with medicine.  You cannot eat or drink without vomiting.  You have pain that does not get better with medicine.  You are unable to pass urine.  You develop a skin rash.  You have a fever.  You have redness around your IV site that gets worse. Get help right away if:  You have difficulty breathing.  You have chest pain.  You have blood in your urine or stool, or you vomit blood. Summary  After the procedure, it is common to have a sore throat or nausea. It is also common to feel tired.  Have a responsible adult stay with you for the time you are told. It is important to have someone help care for you until you are awake and alert.  When you feel hungry, start by eating small amounts of  foods that are soft and easy to digest (bland), such as toast. Gradually return to your regular diet.  Drink enough fluid to keep your urine pale yellow.  Return to your normal activities as told by your health care provider. Ask your health care provider what activities are safe for you. This information is not intended to replace advice given to you by your health care provider. Make sure you discuss any questions you have with your health care provider. Document Revised: 06/25/2020 Document Reviewed: 01/23/2020 Elsevier Patient Education  2021 [employer].

## 2020-12-14 ENCOUNTER — Other Ambulatory Visit (HOSPITAL_COMMUNITY)
Admission: RE | Admit: 2020-12-14 | Discharge: 2020-12-14 | Disposition: A | Discharge status: Home / Self Care | Payer: Medicaid Other | Source: Ambulatory Visit | Attending: General Surgery | Admitting: General Surgery

## 2020-12-14 ENCOUNTER — Encounter (HOSPITAL_COMMUNITY): Payer: Self-pay

## 2020-12-14 ENCOUNTER — Other Ambulatory Visit: Payer: Self-pay

## 2020-12-14 ENCOUNTER — Encounter (HOSPITAL_COMMUNITY)
Admission: RE | Admit: 2020-12-14 | Discharge: 2020-12-14 | Disposition: A | Discharge status: Home / Self Care | Payer: Medicaid Other | Source: Ambulatory Visit | Attending: General Surgery | Admitting: General Surgery

## 2020-12-14 DIAGNOSIS — Z01812 Encounter for preprocedural laboratory examination: Secondary | ICD-10-CM | POA: Diagnosis present

## 2020-12-14 DIAGNOSIS — Z20822 Contact with and (suspected) exposure to covid-19: Secondary | ICD-10-CM | POA: Insufficient documentation

## 2020-12-15 LAB — SARS CORONAVIRUS 2 (TAT 6-24 HRS): SARS Coronavirus 2: NEGATIVE

## 2020-12-16 ENCOUNTER — Encounter (HOSPITAL_COMMUNITY): Payer: Self-pay | Admitting: General Surgery

## 2020-12-16 ENCOUNTER — Ambulatory Visit (HOSPITAL_COMMUNITY): Payer: Medicaid Other | Admitting: Anesthesiology

## 2020-12-16 ENCOUNTER — Encounter (HOSPITAL_COMMUNITY)
Admission: RE | Disposition: A | Discharge status: Home / Self Care | Payer: Self-pay | Source: Home / Self Care | Attending: General Surgery

## 2020-12-16 ENCOUNTER — Ambulatory Visit (HOSPITAL_COMMUNITY)
Admission: RE | Admit: 2020-12-16 | Discharge: 2020-12-16 | Disposition: A | Discharge status: Home / Self Care | Payer: Medicaid Other | Attending: General Surgery | Admitting: General Surgery

## 2020-12-16 DIAGNOSIS — Z833 Family history of diabetes mellitus: Secondary | ICD-10-CM | POA: Insufficient documentation

## 2020-12-16 DIAGNOSIS — Z87891 Personal history of nicotine dependence: Secondary | ICD-10-CM | POA: Insufficient documentation

## 2020-12-16 DIAGNOSIS — Z79899 Other long term (current) drug therapy: Secondary | ICD-10-CM | POA: Insufficient documentation

## 2020-12-16 DIAGNOSIS — Z3A16 16 weeks gestation of pregnancy: Secondary | ICD-10-CM | POA: Diagnosis not present

## 2020-12-16 DIAGNOSIS — Z803 Family history of malignant neoplasm of breast: Secondary | ICD-10-CM | POA: Diagnosis not present

## 2020-12-16 DIAGNOSIS — Z8249 Family history of ischemic heart disease and other diseases of the circulatory system: Secondary | ICD-10-CM | POA: Diagnosis not present

## 2020-12-16 DIAGNOSIS — O99612 Diseases of the digestive system complicating pregnancy, second trimester: Secondary | ICD-10-CM | POA: Diagnosis not present

## 2020-12-16 DIAGNOSIS — K8020 Calculus of gallbladder without cholecystitis without obstruction: Secondary | ICD-10-CM | POA: Diagnosis not present

## 2020-12-16 DIAGNOSIS — K8010 Calculus of gallbladder with chronic cholecystitis without obstruction: Secondary | ICD-10-CM | POA: Diagnosis not present

## 2020-12-16 DIAGNOSIS — Z882 Allergy status to sulfonamides status: Secondary | ICD-10-CM | POA: Diagnosis not present

## 2020-12-16 DIAGNOSIS — Z880 Allergy status to penicillin: Secondary | ICD-10-CM | POA: Insufficient documentation

## 2020-12-16 DIAGNOSIS — O99619 Diseases of the digestive system complicating pregnancy, unspecified trimester: Secondary | ICD-10-CM | POA: Diagnosis present

## 2020-12-16 HISTORY — PX: CHOLECYSTECTOMY: SHX55

## 2020-12-16 LAB — CBC WITH DIFFERENTIAL/PLATELET
Abs Immature Granulocytes: 0.15 10*3/uL — ABNORMAL HIGH (ref 0.00–0.07)
Basophils Absolute: 0.1 10*3/uL (ref 0.0–0.1)
Basophils Relative: 1 %
Eosinophils Absolute: 0.2 10*3/uL (ref 0.0–0.5)
Eosinophils Relative: 2 %
HCT: 35.6 % — ABNORMAL LOW (ref 36.0–46.0)
Hemoglobin: 11.7 g/dL — ABNORMAL LOW (ref 12.0–15.0)
Immature Granulocytes: 2 %
Lymphocytes Relative: 25 %
Lymphs Abs: 2.4 10*3/uL (ref 0.7–4.0)
MCH: 29.9 pg (ref 26.0–34.0)
MCHC: 32.9 g/dL (ref 30.0–36.0)
MCV: 91 fL (ref 80.0–100.0)
Monocytes Absolute: 0.6 10*3/uL (ref 0.1–1.0)
Monocytes Relative: 7 %
Neutro Abs: 6.2 10*3/uL (ref 1.7–7.7)
Neutrophils Relative %: 63 %
Platelets: 288 10*3/uL (ref 150–400)
RBC: 3.91 MIL/uL (ref 3.87–5.11)
RDW: 15.3 % (ref 11.5–15.5)
WBC: 9.7 10*3/uL (ref 4.0–10.5)
nRBC: 0 % (ref 0.0–0.2)

## 2020-12-16 SURGERY — LAPAROSCOPIC CHOLECYSTECTOMY
Anesthesia: General | Site: Abdomen

## 2020-12-16 MED ORDER — CHLORHEXIDINE GLUCONATE CLOTH 2 % EX PADS: 6.0000 | MEDICATED_PAD | Freq: Once | CUTANEOUS | Status: DC

## 2020-12-16 MED ORDER — KETOROLAC TROMETHAMINE 30 MG/ML IJ SOLN
30.0000 mg | Freq: Once | INTRAMUSCULAR | Status: AC
  Administered 2020-12-16: 30 mg via INTRAVENOUS
  Filled 2020-12-16: qty 1

## 2020-12-16 MED ORDER — FENTANYL CITRATE (PF) 100 MCG/2ML IJ SOLN: 25.0000 ug | INTRAMUSCULAR | Status: DC | PRN

## 2020-12-16 MED ORDER — ROCURONIUM BROMIDE 100 MG/10ML IV SOLN
INTRAVENOUS | Status: DC | PRN
  Administered 2020-12-16: 40 mg via INTRAVENOUS

## 2020-12-16 MED ORDER — FENTANYL CITRATE (PF) 250 MCG/5ML IJ SOLN
INTRAMUSCULAR | Status: AC
  Filled 2020-12-16: qty 5

## 2020-12-16 MED ORDER — CHLORHEXIDINE GLUCONATE 0.12 % MT SOLN
15.0000 mL | Freq: Once | OROMUCOSAL | Status: AC
  Administered 2020-12-16: 15 mL via OROMUCOSAL

## 2020-12-16 MED ORDER — SUGAMMADEX SODIUM 500 MG/5ML IV SOLN
INTRAVENOUS | Status: DC | PRN
  Administered 2020-12-16: 200 mg via INTRAVENOUS

## 2020-12-16 MED ORDER — LACTATED RINGERS IV SOLN: INTRAVENOUS | Status: DC

## 2020-12-16 MED ORDER — PHENYLEPHRINE HCL (PRESSORS) 10 MG/ML IV SOLN
INTRAVENOUS | Status: DC | PRN
  Administered 2020-12-16 (×8): 100 ug via INTRAVENOUS

## 2020-12-16 MED ORDER — ORAL CARE MOUTH RINSE: 15.0000 mL | Freq: Once | OROMUCOSAL | Status: AC

## 2020-12-16 MED ORDER — BUPIVACAINE LIPOSOME 1.3 % IJ SUSP
INTRAMUSCULAR | Status: AC
  Filled 2020-12-16: qty 10

## 2020-12-16 MED ORDER — GENTAMICIN SULFATE 40 MG/ML IJ SOLN
5.0000 mg/kg | INTRAVENOUS | Status: AC
  Administered 2020-12-16: 290 mg via INTRAVENOUS
  Filled 2020-12-16: qty 7.25

## 2020-12-16 MED ORDER — CLINDAMYCIN PHOSPHATE 900 MG/50ML IV SOLN
900.0000 mg | INTRAVENOUS | Status: AC
  Administered 2020-12-16: 900 mg via INTRAVENOUS
  Filled 2020-12-16: qty 50

## 2020-12-16 MED ORDER — FENTANYL CITRATE (PF) 100 MCG/2ML IJ SOLN
INTRAMUSCULAR | Status: DC | PRN
  Administered 2020-12-16 (×2): 100 ug via INTRAVENOUS
  Administered 2020-12-16: 50 ug via INTRAVENOUS

## 2020-12-16 MED ORDER — ONDANSETRON HCL 4 MG/2ML IJ SOLN
INTRAMUSCULAR | Status: DC | PRN
  Administered 2020-12-16: 4 mg via INTRAVENOUS

## 2020-12-16 MED ORDER — PROPOFOL 10 MG/ML IV BOLUS
INTRAVENOUS | Status: AC
  Filled 2020-12-16: qty 40

## 2020-12-16 MED ORDER — PROPOFOL 10 MG/ML IV BOLUS
INTRAVENOUS | Status: DC | PRN
  Administered 2020-12-16: 150 mg via INTRAVENOUS

## 2020-12-16 MED ORDER — HEMOSTATIC AGENTS (NO CHARGE) OPTIME
TOPICAL | Status: DC | PRN
  Administered 2020-12-16: 1 via TOPICAL

## 2020-12-16 MED ORDER — BUPIVACAINE LIPOSOME 1.3 % IJ SUSP
INTRAMUSCULAR | Status: DC | PRN
  Administered 2020-12-16: 20 mL

## 2020-12-16 MED ORDER — ONDANSETRON HCL 4 MG/2ML IJ SOLN: 4.0000 mg | Freq: Once | INTRAMUSCULAR | Status: DC | PRN

## 2020-12-16 MED ORDER — SODIUM CHLORIDE 0.9 % IR SOLN
Status: DC | PRN
  Administered 2020-12-16: 1000 mL

## 2020-12-16 MED ORDER — SUCCINYLCHOLINE CHLORIDE 200 MG/10ML IV SOSY
PREFILLED_SYRINGE | INTRAVENOUS | Status: DC | PRN
  Administered 2020-12-16: 120 mg via INTRAVENOUS

## 2020-12-16 MED ORDER — HYDROCODONE-ACETAMINOPHEN 5-325 MG PO TABS: 1.0000 | ORAL_TABLET | Freq: Four times a day (QID) | ORAL | 0 refills | Status: DC | PRN

## 2020-12-16 SURGICAL SUPPLY — 48 items
ADH SKN CLS APL DERMABOND .7 (GAUZE/BANDAGES/DRESSINGS) ×2
APL SRG 38 LTWT LNG FL B (MISCELLANEOUS)
APPLICATOR ARISTA FLEXITIP XL (MISCELLANEOUS) IMPLANT
APPLIER CLIP ROT 10 11.4 M/L (STAPLE) ×3
APR CLP MED LRG 11.4X10 (STAPLE) ×2
BAG RETRIEVAL 10 (BASKET) ×1
CLIP APPLIE ROT 10 11.4 M/L (STAPLE) ×2 IMPLANT
CLOTH BEACON ORANGE TIMEOUT ST (SAFETY) ×3 IMPLANT
COVER LIGHT HANDLE STERIS (MISCELLANEOUS) ×6 IMPLANT
COVER WAND RF STERILE (DRAPES) ×3 IMPLANT
DERMABOND ADVANCED (GAUZE/BANDAGES/DRESSINGS) ×1
DERMABOND ADVANCED .7 DNX12 (GAUZE/BANDAGES/DRESSINGS) ×2 IMPLANT
DURAPREP 26ML APPLICATOR (WOUND CARE) ×3 IMPLANT
ELECT REM PT RETURN 9FT ADLT (ELECTROSURGICAL) ×3
ELECTRODE REM PT RTRN 9FT ADLT (ELECTROSURGICAL) ×2 IMPLANT
GLOVE SURG SS PI 7.5 STRL IVOR (GLOVE) ×3 IMPLANT
GLOVE SURG UNDER LTX SZ6.5 (GLOVE) ×2 IMPLANT
GLOVE SURG UNDER POLY LF SZ7 (GLOVE) ×6 IMPLANT
GOWN STRL REUS W/TWL LRG LVL3 (GOWN DISPOSABLE) ×9 IMPLANT
HEMOSTAT ARISTA ABSORB 3G PWDR (HEMOSTASIS) IMPLANT
HEMOSTAT SNOW SURGICEL 2X4 (HEMOSTASIS) ×3 IMPLANT
INST SET LAPROSCOPIC AP (KITS) ×3 IMPLANT
KIT TURNOVER KIT A (KITS) ×3 IMPLANT
MANIFOLD NEPTUNE II (INSTRUMENTS) ×3 IMPLANT
NDL HYPO 18GX1.5 BLUNT FILL (NEEDLE) ×1 IMPLANT
NDL HYPO 21X1.5 SAFETY (NEEDLE) ×1 IMPLANT
NDL INSUFFLATION 14GA 120MM (NEEDLE) ×1 IMPLANT
NEEDLE BIOPSY 14X6 SOFT TISS (NEEDLE) IMPLANT
NEEDLE HYPO 18GX1.5 BLUNT FILL (NEEDLE) ×3 IMPLANT
NEEDLE HYPO 21X1.5 SAFETY (NEEDLE) ×3 IMPLANT
NEEDLE INSUFFLATION 14GA 120MM (NEEDLE) ×3 IMPLANT
NS IRRIG 1000ML POUR BTL (IV SOLUTION) ×3 IMPLANT
PACK LAP CHOLE LZT030E (CUSTOM PROCEDURE TRAY) ×3 IMPLANT
PAD ARMBOARD 7.5X6 YLW CONV (MISCELLANEOUS) ×3 IMPLANT
PAD TELFA 3X4 1S STER (GAUZE/BANDAGES/DRESSINGS) ×3 IMPLANT
SET BASIN LINEN APH (SET/KITS/TRAYS/PACK) ×3 IMPLANT
SET TUBE SMOKE EVAC HIGH FLOW (TUBING) ×3 IMPLANT
SLEEVE ENDOPATH XCEL 5M (ENDOMECHANICALS) ×3 IMPLANT
SUT MNCRL AB 4-0 PS2 18 (SUTURE) ×6 IMPLANT
SUT VICRYL 0 UR6 27IN ABS (SUTURE) ×3 IMPLANT
SYR 20ML LL LF (SYRINGE) ×6 IMPLANT
SYS BAG RETRIEVAL 10MM (BASKET) ×2
SYSTEM BAG RETRIEVAL 10MM (BASKET) ×2 IMPLANT
TROCAR ENDO BLADELESS 11MM (ENDOMECHANICALS) ×3 IMPLANT
TROCAR XCEL NON-BLD 5MMX100MML (ENDOMECHANICALS) ×3 IMPLANT
TROCAR XCEL UNIV SLVE 11M 100M (ENDOMECHANICALS) ×3 IMPLANT
TUBE CONNECTING 12X1/4 (SUCTIONS) ×3 IMPLANT
WARMER LAPAROSCOPE (MISCELLANEOUS) ×3 IMPLANT

## 2020-12-16 NOTE — Anesthesia Preprocedure Evaluation (Signed)
Anesthesia Evaluation  Patient identified by MRN, date of birth, ID band Patient awake    Reviewed: Allergy & Precautions, H&P , NPO status , Patient's Chart, lab work & pertinent test results, reviewed documented beta blocker date and time   Airway Mallampati: II  TM Distance: >3 FB Neck ROM: full    Dental no notable dental hx.    Pulmonary neg pulmonary ROS, former smoker,    Pulmonary exam normal breath sounds clear to auscultation       Cardiovascular Exercise Tolerance: Good negative cardio ROS   Rhythm:regular Rate:Normal     Neuro/Psych negative neurological ROS  negative psych ROS   GI/Hepatic negative GI ROS, Neg liver ROS,   Endo/Other  negative endocrine ROS  Renal/GU negative Renal ROS     Musculoskeletal   Abdominal   Peds  Hematology  (+) Blood dyscrasia, anemia ,   Anesthesia Other Findings [redacted] weeks pregnant.  Reproductive/Obstetrics (+) Pregnancy                             Anesthesia Physical Anesthesia Plan  ASA: II  Anesthesia Plan: General   Post-op Pain Management:    Induction: Intravenous  PONV Risk Score and Plan: 2 and Ondansetron  Airway Management Planned: Oral ETT  Additional Equipment:   Intra-op Plan:   Post-operative Plan: Extubation in OR  Informed Consent: I have reviewed the patients History and Physical, chart, labs and discussed the procedure including the risks, benefits and alternatives for the proposed anesthesia with the patient or authorized representative who has indicated his/her understanding and acceptance.     Dental Advisory Given  Plan Discussed with: CRNA  Anesthesia Plan Comments: (Avoid Midazolam.)        Anesthesia Quick Evaluation

## 2020-12-16 NOTE — Interval H&P Note (Signed)
History and Physical Interval Note:  12/16/2020 7:54 AM  Alicia [name]  has presented today for surgery, with the diagnosis of Cholelithiasis, Elevated LFT's.  The various methods of treatment have been discussed with the patient and family. After consideration of risks, benefits and other options for treatment, the patient has consented to  Procedure(s): LAPAROSCOPIC CHOLECYSTECTOMY (N/A) LIVER BIOPSY (N/A) as a surgical intervention.  The patient's history has been reviewed, patient examined, no change in status, stable for surgery.  I have reviewed the patient's chart and labs.  Questions were answered to the patient's satisfaction.     [facility] Signs

## 2020-12-16 NOTE — Progress Notes (Signed)
Blood drawn and sent to labs for results. FHR 163.

## 2020-12-16 NOTE — Transfer of Care (Signed)
Immediate Anesthesia Transfer of Care Note  Patient: Alicia [name]  Procedure(s) Performed: LAPAROSCOPIC CHOLECYSTECTOMY (N/A Abdomen)  Patient Location: PACU  Anesthesia Type:General  Level of Consciousness: awake, alert , oriented and patient cooperative  Airway & Oxygen Therapy: Patient Spontanous Breathing  Post-op Assessment: Report given to RN, Post -op Vital signs reviewed and stable and Patient moving all extremities X 4  Post vital signs: Reviewed and stable  Last Vitals:  Vitals Value Taken Time  BP 124/96 12/16/20 0922  Temp    Pulse 117 12/16/20 0924  Resp 30 12/16/20 0924  SpO2 92 % 12/16/20 0924  Vitals shown include unvalidated device data.  Last Pain:  Vitals:   12/16/20 0733  PainSc: 0-No pain         Complications: No complications documented.

## 2020-12-16 NOTE — Discharge Instructions (Signed)
Minimally Invasive Cholecystectomy, Care After This sheet gives you information about how to care for yourself after your procedure. Your health care provider may also give you more specific instructions. If you have problems or questions, contact your health care provider. What can I expect after the procedure? After the procedure, it is common to have:  Pain at your incision sites. You will be given medicines to control this pain.  Mild nausea or vomiting.  Bloating and possible shoulder pain from the gas that was used during the procedure. Follow these instructions at home: Medicines  Take over-the-counter and prescription medicines only as told by your health care provider.  If you were prescribed an antibiotic medicine, take or use it as told by your health care provider. Do not stop using the antibiotic even if you start to feel better.  Ask your health care provider if the medicine prescribed to you: ? Requires you to avoid driving or using machinery. ? Can cause constipation. You may need to take these actions to prevent or treat constipation:  Drink enough fluid to keep your urine pale yellow.  Take over-the-counter or prescription medicines.  Eat foods that are high in fiber, such as beans, whole grains, and fresh fruits and vegetables.  Limit foods that are high in fat and processed sugars, such as fried or sweet foods. Incision care  Follow instructions from your health care provider about how to take care of your incisions. Make sure you: ? Wash your hands with soap and water for at least 20 seconds before and after you change your bandage (dressing). If soap and water are not available, use hand sanitizer. ? Change your dressing as told by your health care provider. ? Leave stitches (sutures), skin glue, or adhesive strips in place. These skin closures may need to be in place for 2 weeks or longer. If adhesive strip edges start to loosen and curl up, you may trim the  loose edges. Do not remove adhesive strips completely unless your health care provider tells you to do that.  Do not take baths, swim, or use a hot tub until your health care provider approves. Ask your health care provider if you may take showers. You may only be allowed to take sponge baths.  Check your incision area every day for signs of infection. Check for: ? More redness, swelling, or pain. ? Fluid or blood. ? Warmth. ? Pus or a bad smell.   Activity  Rest as told by your health care provider.  Avoid sitting for a long time without moving. Get up to take short walks every 1-2 hours. This is important to improve blood flow and breathing. Ask for help if you feel weak or unsteady.  Do not lift anything that is heavier than 10 lb (4.5 kg), or the limit that you are told, until your health care provider says that it is safe.  Do not play contact sports until your health care provider approves.  Do not return to work or school until your health care provider approves.  Return to your normal activities as told by your health care provider. Ask your health care provider what activities are safe for you. General instructions  If you were given a sedative during the procedure, it can affect you for several hours. Do not drive or operate machinery until your health care provider says that it is safe.  Keep all follow-up visits as told by your health care provider. This is important. Contact a health  care provider if:  You develop a rash.  You have more redness, swelling, or pain around your incisions.  You have fluid or blood coming from your incisions.  Your incisions feel warm to the touch.  You have pus or a bad smell coming from your incisions.  You have a fever.  One or more of your incisions breaks open. Get help right away if:  You have trouble breathing.  You have chest pain.  You have increasing pain in your shoulders.  You faint or feel dizzy when you  stand.  You have severe pain in your abdomen.  You have nausea or vomiting that lasts for more than one day.  You have leg pain. Summary  After your procedure, it is common to have pain at the incision sites. You may also have nausea or bloating.  Follow your health care provider's instructions about medicine, activity restrictions, and caring for your incision areas. Do not do activities that require a lot of effort.  Contact a health care provider if you have a fever or other signs of infection, such as more redness, swelling, or pain around the incisions.  Get help right away if you have chest pain, increasing pain in the shoulders, or trouble breathing. This information is not intended to replace advice given to you by your health care provider. Make sure you discuss any questions you have with your health care provider. Document Revised: 07/10/2019 Document Reviewed: 07/10/2019 Elsevier Patient Education  2021 [place]IL Sunday December 20, 2020. DO NOT USE ANY ADDITIONAL NUMBING MEDICATIONS WITHOUT CONSULTING A PHYSICIAN Bupivacaine Liposomal Suspension for Injection What is this medicine? BUPIVACAINE LIPOSOMAL (bue PIV a kane LIP oh som al) is an anesthetic. It causes loss of feeling in the skin or other tissues. It is used to prevent and to treat pain from some procedures. This medicine may be used for other purposes; ask your health care provider or pharmacist if you have questions. COMMON BRAND NAME(S): EXPAREL What should I tell my health care provider before I take this medicine? They need to know if you have any of these conditions:  G6PD deficiency  heart disease  kidney disease  liver disease  low blood pressure  lung or breathing disease, like asthma  an unusual or allergic reaction to bupivacaine, other medicines, foods, dyes, or preservatives  pregnant or trying to get pregnant  breast-feeding How should  I use this medicine? This medicine is injected into the affected area. It is given by a health care provider in a hospital or clinic setting. Talk to your health care provider about the use of this medicine in children. While it may be given to children as young as 6 years for selected conditions, precautions do apply. Overdosage: If you think you have taken too much of this medicine contact a poison control center or emergency room at once. NOTE: This medicine is only for you. Do not share this medicine with others. What if I miss a dose? This does not apply. What may interact with this medicine? This medicine may interact with the following medications:  acetaminophen  certain antibiotics like dapsone, nitrofurantoin, aminosalicylic acid, sulfonamides  certain medicines for seizures like phenobarbital, phenytoin, valproic acid  chloroquine  cyclophosphamide  flutamide  hydroxyurea  ifosfamide  metoclopramide  nitric oxide  nitroglycerin  nitroprusside  nitrous oxide  other local anesthetics like lidocaine, pramoxine, tetracaine  primaquine  quinine  rasburicase  sulfasalazine  This list may not describe all possible interactions. Give your health care provider a list of all the medicines, herbs, non-prescription drugs, or dietary supplements you use. Also tell them if you smoke, drink alcohol, or use illegal drugs. Some items may interact with your medicine. What should I watch for while using this medicine? Your condition will be monitored carefully while you are receiving this medicine. Be careful to avoid injury while the area is numb, and you are not aware of pain. What side effects may I notice from receiving this medicine? Side effects that you should report to your doctor or health care professional as soon as possible:  allergic reactions like skin rash, itching or hives, swelling of the face, lips, or tongue  seizures  signs and symptoms of a  dangerous change in heartbeat or heart rhythm like chest pain; dizziness; fast, irregular heartbeat; palpitations; feeling faint or lightheaded; falls; breathing problems  signs and symptoms of methemoglobinemia such as pale, gray, or blue colored skin; headache; fast heartbeat; shortness of breath; feeling faint or lightheaded, falls; tiredness Side effects that usually do not require medical attention (report to your doctor or health care professional if they continue or are bothersome):  anxious  back pain  changes in taste  changes in vision  constipation  dizziness  fever  nausea, vomiting This list may not describe all possible side effects. Call your doctor for medical advice about side effects. You may report side effects to FDA at 1-800-FDA-1088. Where should I keep my medicine? This drug is given in a hospital or clinic and will not be stored at home. NOTE: This sheet is a summary. It may not cover all possible information. If you have questions about this medicine, talk to your doctor, pharmacist, or health care provider.  2021 Elsevier/Gold Standard (2020-01-16 12:24:57)   Acetaminophen; Hydrocodone tablets or capsules What is this medicine? ACETAMINOPHEN; HYDROCODONE (a set a MEE noe fen; hye droe KOE done) is a pain reliever. It is used to treat moderate to severe pain. This medicine may be used for other purposes; ask your health care provider or pharmacist if you have questions. COMMON BRAND NAME(S): Anexsia, Bancap HC, Ceta-Plus, Co-Gesic, Comfortpak, Dolagesic, [employer], DuoCet, Hydrocet, Hydrogesic, [place], Lorcet HD, Lorcet Plus, Lortab, Margesic H, Maxidone, [place], Polygesic, [place], [place], [place], Vicodin, Vicodin ES, Vicodin HP, Charlane Ferretti What should I tell my health care provider before I take this medicine? They need to know if you have any of these conditions:  brain tumor  drug abuse or addiction  head injury  heart disease  if  you often drink alcohol  kidney disease  liver disease  low adrenal gland function  lung disease, asthma, or breathing problems  seizures  stomach or intestine problems  taken an MAOI like Marplan, Nardil, or Parnate in the last 14 days  an unusual or allergic reaction to acetaminophen, hydrocodone, other medicines, foods, dyes, or preservatives  pregnant or trying to get pregnant  breast-feeding How should I use this medicine? Take this medicine by mouth with a glass of water. Follow the directions on the prescription label. You can take it with or without food. If it upsets your stomach, take it with food. Do not take your medicine more often than directed. A special MedGuide will be given to you by the pharmacist with each prescription and refill. Be sure to read this information carefully each time. Talk to your pediatrician regarding the use of this medicine in children. Special care  may be needed. Overdosage: If you think you have taken too much of this medicine contact a poison control center or emergency room at once. NOTE: This medicine is only for you. Do not share this medicine with others. What if I miss a dose? If you miss a dose, take it as soon as you can. If it is almost time for your next dose, take only that dose. Do not take double or extra doses. What may interact with this medicine? This medicine may interact with the following medications:  alcohol  antiviral medicines for HIV or AIDS  atropine  antihistamines for allergy, cough and cold  certain antibiotics like erythromycin, clarithromycin  certain medicines for anxiety or sleep  certain medicines for bladder problems like oxybutynin, tolterodine  certain medicines for depression like amitriptyline, fluoxetine, sertraline  certain medicines for fungal infections like ketoconazole and itraconazole  certain medicines for Parkinson's disease like benztropine, trihexyphenidyl  certain medicines  for seizures like carbamazepine, phenobarbital, phenytoin, primidone  certain medicines for stomach problems like dicyclomine, hyoscyamine  certain medicines for travel sickness like scopolamine  general anesthetics like halothane, isoflurane, methoxyflurane, propofol  ipratropium  local anesthetics like lidocaine, pramoxine, tetracaine  MAOIs like Carbex, Eldepryl, Marplan, Nardil, and Parnate  medicines that relax muscles for surgery  other medicines with acetaminophen  other narcotic medicines for pain or cough  phenothiazines like chlorpromazine, mesoridazine, prochlorperazine, thioridazine  rifampin This list may not describe all possible interactions. Give your health care provider a list of all the medicines, herbs, non-prescription drugs, or dietary supplements you use. Also tell them if you smoke, drink alcohol, or use illegal drugs. Some items may interact with your medicine. What should I watch for while using this medicine? Tell your health care provider if your pain does not go away, if it gets worse, or if you have new or a different type of pain. You may develop tolerance to this drug. Tolerance means that you will need a higher dose of the drug for pain relief. Tolerance is normal and is expected if you take this drug for a long time. There are different types of narcotic drugs (opioids) for pain. If you take more than one type at the same time, you may have more side effects. Give your health care provider a list of all drugs you use. He or she will tell you how much drug to take. Do not take more drug than directed. Get emergency help right away if you have problems breathing. Do not suddenly stop taking your drug because you may develop a severe reaction. Your body becomes used to the drug. This does NOT mean you are addicted. Addiction is a behavior related to getting and using a drug for a nonmedical reason. If you have pain, you have a medical reason to take pain  drug. Your health care provider will tell you how much drug to take. If your health care provider wants you to stop the drug, the dose will be slowly lowered over time to avoid any side effects. Talk to your health care provider about naloxone and how to get it. Naloxone is an emergency drug used for an opioid overdose. An overdose can happen if you take too much opioid. It can also happen if an opioid is taken with some other drugs or substances, like alcohol. Know the symptoms of an overdose, like trouble breathing, unusually tired or sleepy, or not being able to respond or wake up. Make sure to tell caregivers and close  contacts where it is stored. Make sure they know how to use it. After naloxone is given, you must get emergency help right away. Naloxone is a temporary treatment. Repeat doses may be needed. Do not take other drugs that contain acetaminophen with this drug. Many non-prescription drugs contain acetaminophen. Always read labels carefully. If you have questions, ask your health care provider. If you take too much acetaminophen, get medical help right away. Too much acetaminophen can be very dangerous and cause liver damage. Even if you do not have symptoms, it is important to get help right away. You may get drowsy or dizzy. Do not drive, use machinery, or do anything that needs mental alertness until you know how this drug affects you. Do not stand up or sit up quickly, especially if you are an older patient. This reduces the risk of dizzy or fainting spells. Alcohol may interfere with the effect of this drug. Avoid alcoholic drinks. This drug will cause constipation. If you do not have a bowel movement for 3 days, call your health care provider. Your mouth may get dry. Chewing sugarless gum or sucking hard candy and drinking plenty of water may help. Contact your health care provider if the problem does not go away or is severe. What side effects may I notice from receiving this  medicine? Side effects that you should report to your doctor or health care professional as soon as possible:  allergic reactions like skin rash, itching or hives, swelling of the face, lips, or tongue  breathing problems  confusion  redness, blistering, peeling or loosening of the skin, including inside the mouth  signs and symptoms of low blood pressure like dizziness; feeling faint or lightheaded, falls; unusually weak or tired  trouble passing urine or change in the amount of urine  yellowing of the eyes or skin Side effects that usually do not require medical attention (report to your doctor or health care professional if they continue or are bothersome):  constipation  dry mouth  nausea, vomiting  tiredness This list may not describe all possible side effects. Call your doctor for medical advice about side effects. You may report side effects to FDA at 1-800-FDA-1088. Where should I keep my medicine? Keep out of the reach of children. This medicine can be abused. Keep your medicine in a safe place to protect it from theft. Do not share this medicine with anyone. Selling or giving away this medicine is dangerous and against the law. Store at room temperature between 15 and 30 degrees C (59 and 86 degrees F). This medicine may cause harm and death if it is taken by other adults, children, or pets. Return medicine that has not been used to an official disposal site. Contact the DEA at 250-556-6973 or your city/county government to find a site. If you cannot return the medicine, flush it down the toilet. Do not use the medicine after the expiration date. NOTE: This sheet is a summary. It may not cover all possible information. If you have questions about this medicine, talk to your doctor, pharmacist, or health care provider.  2021 Elsevier/Gold Standard (2019-05-22 12:25:54)   General Anesthesia, Adult, Care After This sheet gives you information about how to care for  yourself after your procedure. Your health care provider may also give you more specific instructions. If you have problems or questions, contact your health care provider. What can I expect after the procedure? After the procedure, the following side effects are common:  Pain or  discomfort at the IV site.  Nausea.  Vomiting.  Sore throat.  Trouble concentrating.  Feeling cold or chills.  Feeling weak or tired.  Sleepiness and fatigue.  Soreness and body aches. These side effects can affect parts of the body that were not involved in surgery. Follow these instructions at home: For the time period you were told by your health care provider:  Rest.  Do not participate in activities where you could fall or become injured.  Do not drive or use machinery.  Do not drink alcohol.  Do not take sleeping pills or medicines that cause drowsiness.  Do not make important decisions or sign legal documents.  Do not take care of children on your own.   Eating and drinking  Follow any instructions from your health care provider about eating or drinking restrictions.  When you feel hungry, start by eating small amounts of foods that are soft and easy to digest (bland), such as toast. Gradually return to your regular diet.  Drink enough fluid to keep your urine pale yellow.  If you vomit, rehydrate by drinking water, juice, or clear broth. General instructions  If you have sleep apnea, surgery and certain medicines can increase your risk for breathing problems. Follow instructions from your health care provider about wearing your sleep device: ? Anytime you are sleeping, including during daytime naps. ? While taking prescription pain medicines, sleeping medicines, or medicines that make you drowsy.  Have a responsible adult stay with you for the time you are told. It is important to have someone help care for you until you are awake and alert.  Return to your normal activities as  told by your health care provider. Ask your health care provider what activities are safe for you.  Take over-the-counter and prescription medicines only as told by your health care provider.  If you smoke, do not smoke without supervision.  Keep all follow-up visits as told by your health care provider. This is important. Contact a health care provider if:  You have nausea or vomiting that does not get better with medicine.  You cannot eat or drink without vomiting.  You have pain that does not get better with medicine.  You are unable to pass urine.  You develop a skin rash.  You have a fever.  You have redness around your IV site that gets worse. Get help right away if:  You have difficulty breathing.  You have chest pain.  You have blood in your urine or stool, or you vomit blood. Summary  After the procedure, it is common to have a sore throat or nausea. It is also common to feel tired.  Have a responsible adult stay with you for the time you are told. It is important to have someone help care for you until you are awake and alert.  When you feel hungry, start by eating small amounts of foods that are soft and easy to digest (bland), such as toast. Gradually return to your regular diet.  Drink enough fluid to keep your urine pale yellow.  Return to your normal activities as told by your health care provider. Ask your health care provider what activities are safe for you. This information is not intended to replace advice given to you by your health care provider. Make sure you discuss any questions you have with your health care provider. Document Revised: 06/25/2020 Document Reviewed: 01/23/2020 Elsevier Patient Education  2021 [employer].

## 2020-12-16 NOTE — Op Note (Signed)
Patient:  Alicia [name]  DOB:  14-Dec-1990  MRN:  [id number]   Preop Diagnosis: Biliary colic, cholelithiasis, intrauterine pregnancy  Postop Diagnosis: Same  Procedure: Laparoscopic cholecystectomy  Surgeon: Aviva Signs, MD  Assistant: Curlene Labrum, MD  Anes: General endotracheal  Indications: Patient is a 30 year old black female who is [redacted] weeks pregnant who presents with biliary colic secondary to cholelithiasis.  The risks and benefits of the procedure including bleeding, infection, hepatobiliary injury, miscarriage, the possibility of an open procedure were fully explained to the patient, who gave informed consent.  Procedure note: The patient was placed in supine position.  After induction of general endotracheal anesthesia, the abdomen was prepped and draped using the usual sterile technique with ChloraPrep.  Surgical site confirmation was performed.  The fundal height was below the umbilicus.  Thus, a supraumbilical incision was made down to the fascia.  The Veress needle was introduced into the abdominal cavity and confirmation of placement was done using the saline drop test.  The abdomen was then insufflated to 15 mmHg pressure.  An 11 mm trocar was introduced into the abdominal cavity under direct visualization without difficulty.  The patient was placed in reverse Trendelenburg position and an additional 11 mm trocar was placed in the epigastric region and 5 mm trochars were placed in the right upper quadrant and right flank regions.  The uterus was inspected and no injury was noted.  This was confirmed with Dr. Elonda Husky of OB/GYN.  The liver was inspected noted to be within normal limits.  The gallbladder was retracted in a dynamic fashion in order to provide a critical view of the triangle of [place].  The cystic duct was first identified.  Its junction to the infundibulum was fully identified.  Endoclips were placed proximally and distally on the cystic duct, and the cystic duct  was divided.  This was likewise done the cystic artery.  The gallbladder was freed away from the gallbladder fossa using Bovie electrocautery.  The gallbladder was delivered through the epigastric trocar site using Endo Catch bag.  The gallbladder fossa was inspected and no abnormal bleeding or bile leakage was noted.  Surgicel was placed in the gallbladder fossa.  All fluid and air were then evacuated from the abdominal cavity prior to the removal of the trochars.  All wounds were irrigated with normal saline.  All wounds were injected with Exparel.  The supraumbilical fascia as well as epigastric fascia were reapproximated using 0 Vicryl interrupted sutures.  All skin incisions were closed using 4-0 Monocryl subcuticular suture.  Dermabond was applied.  All tape and needle counts were correct at the end of the procedure.  The patient was extubated in the operating room and transferred to PACU in stable condition.  Complications: None  EBL: Minimal  Specimen: Gallbladder

## 2020-12-16 NOTE — Anesthesia Postprocedure Evaluation (Signed)
Anesthesia Post Note  Patient: Alicia [name]  Procedure(s) Performed: LAPAROSCOPIC CHOLECYSTECTOMY (N/A Abdomen)  Patient location during evaluation: PACU Anesthesia Type: General Level of consciousness: awake, oriented and patient cooperative Pain management: satisfactory to patient Vital Signs Assessment: post-procedure vital signs reviewed and stable Respiratory status: spontaneous breathing, respiratory function stable and nonlabored ventilation Cardiovascular status: tachycardic Postop Assessment: no apparent nausea or vomiting Anesthetic complications: no Comments: FHR=140's via doppler   No complications documented.   Last Vitals:  Vitals:   12/16/20 0733  BP: 112/78  Pulse: 97  Resp: 16  Temp: 36.8 C  SpO2: 100%    Last Pain:  Vitals:   12/16/20 0733  PainSc: 0-No pain                 Canesha Tesfaye

## 2020-12-16 NOTE — Anesthesia Procedure Notes (Signed)
Procedure Name: Intubation Date/Time: 12/16/2020 8:31 AM Performed by: Jonna Munro, CRNA Pre-anesthesia Checklist: Patient identified, Emergency Drugs available, Suction available, Patient being monitored and Timeout performed Patient Re-evaluated:Patient Re-evaluated prior to induction Oxygen Delivery Method: Circle system utilized Preoxygenation: Pre-oxygenation with 100% oxygen Induction Type: IV induction, Cricoid Pressure applied and Rapid sequence Laryngoscope Size: Mac and 3 Grade View: Grade I Tube type: Oral Tube size: 7.0 mm Number of attempts: 1 Airway Equipment and Method: Stylet Placement Confirmation: ETT inserted through vocal cords under direct vision,  positive ETCO2 and breath sounds checked- equal and bilateral Secured at: 22 cm Tube secured with: Tape Dental Injury: Teeth and Oropharynx as per pre-operative assessment

## 2020-12-17 ENCOUNTER — Encounter (HOSPITAL_COMMUNITY): Payer: Self-pay | Admitting: General Surgery

## 2020-12-17 LAB — SURGICAL PATHOLOGY

## 2020-12-18 ENCOUNTER — Other Ambulatory Visit: Payer: Self-pay

## 2020-12-21 ENCOUNTER — Telehealth: Payer: Self-pay

## 2020-12-21 ENCOUNTER — Telehealth: Payer: Self-pay | Admitting: Women's Health

## 2020-12-21 ENCOUNTER — Other Ambulatory Visit: Payer: Self-pay | Admitting: Women's Health

## 2020-12-21 DIAGNOSIS — O0992 Supervision of high risk pregnancy, unspecified, second trimester: Secondary | ICD-10-CM

## 2020-12-21 DIAGNOSIS — O285 Abnormal chromosomal and genetic finding on antenatal screening of mother: Secondary | ICD-10-CM | POA: Insufficient documentation

## 2020-12-21 NOTE — Telephone Encounter (Signed)
Called patient to schedule for a Detail, Anatomy and Genetic Counseling for abnormal Panorama.  Patient aware of appointments scheduled for 01/06/21 at 8:45am

## 2020-12-21 NOTE — Telephone Encounter (Signed)
Notified pt of abnormal Panorama results: low fetal fraction 2.3% w/ high risk T18/T13/triploidy. Discussed this is screening only, not diagnostic. Offered referral to MFM genetic counselor and detailed u/s, wants to do. Orders placed and note routed to [facility] to schedule.  Had gallbladder out Wed, itching has not improved yet.  Roma Schanz, CNM, [facility] 12/21/2020 2:47 PM

## 2020-12-25 ENCOUNTER — Encounter: Payer: Self-pay | Admitting: General Surgery

## 2020-12-25 ENCOUNTER — Ambulatory Visit (INDEPENDENT_AMBULATORY_CARE_PROVIDER_SITE_OTHER): Payer: Medicaid Other | Admitting: General Surgery

## 2020-12-25 DIAGNOSIS — Z09 Encounter for follow-up examination after completed treatment for conditions other than malignant neoplasm: Secondary | ICD-10-CM

## 2020-12-25 NOTE — Progress Notes (Signed)
Subjective:     [place]  Virtual telephone postoperative visit performed.  Patient states she is doing well.  I had already been in contact with Dr. Elonda Husky and he had seen the patient in the office and said the pregnancy was progressing well.  Her preoperative biliary symptoms have resolved.  She is pleased with the results. Objective:    LMP 08/26/2020 (Exact Date)   General:   Telephone visit  Final pathology consistent with diagnosis.     Assessment:    Doing well postoperatively.   Being followed by obstetrics for her pregnancy.  It seems that she has tolerated the surgery well.   Plan:   Follow-up as needed with me.  Total telephone time was 2 minutes.  As this was a part of the global surgical fee, this postoperative visit is not billable.

## 2020-12-29 ENCOUNTER — Other Ambulatory Visit: Payer: Self-pay | Admitting: Family Medicine

## 2020-12-29 ENCOUNTER — Encounter: Payer: Self-pay | Admitting: Family Medicine

## 2020-12-29 DIAGNOSIS — Z09 Encounter for follow-up examination after completed treatment for conditions other than malignant neoplasm: Secondary | ICD-10-CM

## 2020-12-30 ENCOUNTER — Other Ambulatory Visit: Payer: Medicaid Other

## 2020-12-30 ENCOUNTER — Ambulatory Visit: Payer: Medicaid Other | Admitting: Obstetrics & Gynecology

## 2020-12-30 ENCOUNTER — Other Ambulatory Visit: Payer: Self-pay

## 2020-12-30 ENCOUNTER — Encounter: Payer: Self-pay | Admitting: Obstetrics & Gynecology

## 2020-12-30 VITALS — BP 112/70 | HR 103 | Wt 133.5 lb

## 2020-12-30 DIAGNOSIS — K831 Obstruction of bile duct: Secondary | ICD-10-CM

## 2020-12-30 DIAGNOSIS — Z8619 Personal history of other infectious and parasitic diseases: Secondary | ICD-10-CM

## 2020-12-30 DIAGNOSIS — B009 Herpesviral infection, unspecified: Secondary | ICD-10-CM

## 2020-12-30 DIAGNOSIS — O26612 Liver and biliary tract disorders in pregnancy, second trimester: Secondary | ICD-10-CM

## 2020-12-30 DIAGNOSIS — O0992 Supervision of high risk pregnancy, unspecified, second trimester: Secondary | ICD-10-CM

## 2020-12-30 DIAGNOSIS — Z3A18 18 weeks gestation of pregnancy: Secondary | ICD-10-CM

## 2020-12-30 LAB — POCT URINALYSIS DIPSTICK OB
Glucose, UA: NEGATIVE
Ketones, UA: NEGATIVE
Nitrite, UA: NEGATIVE
POC,PROTEIN,UA: NEGATIVE

## 2020-12-30 NOTE — Progress Notes (Signed)
HIGH-RISK PREGNANCY VISIT Patient name: Alicia [name] MRN [id number]  Date of birth: Jul 02, 1991 Chief Complaint:   High Risk Gestation (AFP declined)  History of Present Illness:   Alicia [name] is a 30 y.o. 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  Depression screen [facility] 2/9 12/08/2020 01/28/2020 03/07/2018 11/02/2017  Decreased Interest 0 0 0 0  Down, Depressed, Hopeless 0 0 0 0  PHQ - 2 Score 0 0 0 0  Altered sleeping 3 0 - -  Tired, decreased energy 0 0 - -  Change in appetite 2 0 - -  Feeling bad or failure about yourself  0 0 - -  Trouble concentrating 0 0 - -  Moving slowly or fidgety/restless 0 0 - -  Suicidal thoughts 0 0 - -  PHQ-9 Score 5 0 - -    Contractions: Not present. Vag. Bleeding: None.  Movement: Present. denies leaking of fluid.  Review of Systems:   Pertinent items are noted in HPI Denies abnormal vaginal discharge w/ itching/odor/irritation, headaches, visual changes, shortness of breath, chest pain, abdominal pain, severe nausea/vomiting, or problems with urination or bowel movements unless otherwise stated above. Pertinent History Reviewed:  Reviewed past medical,surgical, social, obstetrical and family history.  Reviewed problem list, medications and allergies. Physical Assessment:   Vitals:   12/30/20 1104  BP: 112/70  Pulse: (!) 103  Weight: 133 lb 8 oz (60.6 kg)  Body mass index is 24.42 kg/m (pended).           Physical Examination:   General appearance: alert, well appearing, and in no distress  Mental status: alert, oriented to person, place, and time  Skin: warm & dry   Extremities: Edema: None    Cardiovascular: normal heart rate noted  Respiratory: normal  respiratory effort, no distress  Abdomen: gravid, soft, non-tender, incisions C/D/I with dermabond- healing appropriately  Pelvic: Cervical exam deferred         Fetal Status: Fetal Heart Rate (bpm): 150   Movement: Present    Chaperone: N/A    Results for orders placed or performed in visit on 12/30/20 (from the past 24 hour(s))  POC Urinalysis Dipstick OB   Collection Time: 12/30/20 11:08 AM  Result Value Ref Range   Color, UA     Clarity, UA     Glucose, UA Negative Negative   Bilirubin, UA     Ketones, UA neg    Spec Grav, UA     Blood, UA trace    pH, UA     POC,PROTEIN,UA Negative Negative, Trace, Small (1+), Moderate (2+), Large (3+), 4+   Urobilinogen, UA     Nitrite, UA neg    Leukocytes, UA Trace (A) Negative   Appearance     Odor      Assessment & Plan:  High-risk pregnancy: G3P1011 at [redacted]w[redacted]d with an Estimated Date of Delivery: 06/02/21   -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   Labs/procedures today: Declined genetic testing  Treatment Plan:  Anatomy scan scheduled for next week with MFM  Reviewed: Preterm labor symptoms and general obstetric precautions including but not limited to vaginal bleeding, contractions, leaking of fluid and fetal movement were reviewed in detail with the patient.  All questions were answered.    Follow-up: Return in about 4 weeks (around 01/27/2021) for [place] visit.   Future Appointments  Date Time Provider [place]  12/31/2020  2:30 PM Rogene [place], MD [facility] None  01/06/2021  8:45 AM [facility] NURSE [facility]  01/06/2021  9:00 AM [facility] US1 [facility]  01/06/2021 10:30 AM [facility] GENETIC COUNSELING RM [facility]  01/27/2021  9:50 AM Myrtis Ser, CNM [facility]    Orders Placed This Encounter   Procedures  . Trichomonas vaginalis, RNA  . POC Urinalysis Dipstick OB   Janyth Pupa, DO Attending [place], [facility] [place] for [employer], [place]

## 2020-12-31 ENCOUNTER — Encounter (INDEPENDENT_AMBULATORY_CARE_PROVIDER_SITE_OTHER): Payer: Self-pay | Admitting: Internal Medicine

## 2020-12-31 ENCOUNTER — Ambulatory Visit (INDEPENDENT_AMBULATORY_CARE_PROVIDER_SITE_OTHER): Payer: Medicaid Other | Admitting: Internal Medicine

## 2020-12-31 VITALS — BP 97/65 | HR 102 | Temp 99.2°F | Ht 62.0 in | Wt 129.1 lb

## 2020-12-31 DIAGNOSIS — K831 Obstruction of bile duct: Secondary | ICD-10-CM | POA: Diagnosis not present

## 2020-12-31 DIAGNOSIS — O26619 Liver and biliary tract disorders in pregnancy, unspecified trimester: Secondary | ICD-10-CM | POA: Diagnosis not present

## 2020-12-31 NOTE — Patient Instructions (Signed)
Physician will call with results of blood test when completed. 

## 2020-12-31 NOTE — Progress Notes (Unsigned)
Presenting complaint;  Follow-up for abnormal LFTs with intractable pruritus.  Database and subjective:  Patient is 30 year old African-American female who is here for scheduled visit.  She was last seen on 11/19/2020 for elevated transaminases and intractable pruritus.  She had markedly elevated serum bile acids.  Biochemical markers were negative for hepatitis A, B, and C prior to that visit.  She was begun on high-dose Urso by Dr. Elonda Husky.  She had ultrasound which revealed gallstones.  Ceruloplasmin, alpha-1 antitrypsin, anti-smooth muscle antibody and mitochondrial antibodies were all negative.  Patient was begun on hydroxyzine and cholestyramine. In the meantime she saw Dr. Arnoldo Morale and had cholecystectomy on 12/16/2020.  Dr. Arnoldo Morale felt doing liver biopsy may be risky and therefore decided not to do 1. She did well and was able to go home the same day. Patient says she feels much better.  Pruritus now is mild and bearable.  It is worse at night.  She is taking hydroxyzine only at night.  She remains on cholestyramine twice daily.  Her appetite is good.  According to our weight record she has gained 129 pounds.  She says she weighed 133 pounds in Dr. Brynda Greathouse office last week. She is now [redacted] weeks pregnant and states her pregnancy is progressing very well. She has gone back to work.  Current Medications: Outpatient Encounter Medications as of 12/31/2020  Medication Sig  . Blood Pressure Monitor MISC For regular home bp monitoring during pregnancy  . cholestyramine (QUESTRAN) 4 g packet Take 1 packet (4 g total) by mouth 2 (two) times daily.  . hydrOXYzine (ATARAX/VISTARIL) 25 MG tablet Take 1 tablet (25 mg total) by mouth 3 (three) times daily as needed.  . Prenatal MV & Min w/FA-DHA (PRENATAL GUMMIES PO) Take 2 tablets by mouth daily.  . ursodiol (ACTIGALL) 300 MG capsule Take 3 capsules (900 mg total) by mouth 3 (three) times daily.   No facility-administered encounter medications on file as of  12/31/2020.     Objective: Blood pressure 97/65, pulse (!) 102, temperature 99.2 F (37.3 C), temperature source Oral, height $RemoveBefo'5\' 2"'aRHFKjmuezl$  (1.575 m), weight 129 lb 1.9 oz (58.6 kg), last menstrual period 08/26/2020. Patient is alert and in no acute distress. Conjunctiva is pink. Sclera is nonicteric Oropharyngeal mucosa is normal. No neck masses or thyromegaly noted. Cardiac exam with regular rhythm normal S1 and S2. No murmur or gallop noted. Lungs are clear to auscultation. Abdomen is full.  Laparoscopy port sites without any drainage.  Abdomen is soft and nontender without hepatosplenomegaly. No LE edema or clubbing noted.  Labs/studies Results:  CBC Latest Ref Rng & Units 12/16/2020 12/08/2020 11/02/2017  WBC 4.0 - 10.5 K/uL 9.7 7.9 7.1  Hemoglobin 12.0 - 15.0 g/dL 11.7(L) 13.4 13.8  Hematocrit 36.0 - 46.0 % 35.6(L) 38.9 40.2  Platelets 150 - 400 K/uL 288 278 265    CMP Latest Ref Rng & Units 12/08/2020 11/19/2020 11/03/2020  Glucose 65 - 99 mg/dL 86 - 101(H)  BUN 6 - 20 mg/dL 5(L) - 6  Creatinine 0.57 - 1.00 mg/dL 0.58 - 0.71  Sodium 134 - 144 mmol/L 137 - 134  Potassium 3.5 - 5.2 mmol/L 4.2 - 3.6  Chloride 96 - 106 mmol/L 100 - 99  CO2 20 - 29 mmol/L 20 - 19(L)  Calcium 8.7 - 10.2 mg/dL 10.0 - 10.1  Total Protein 6.0 - 8.5 g/dL 7.8 7.3 7.5  Total Bilirubin 0.0 - 1.2 mg/dL 0.6 0.5 1.2  Alkaline Phos 44 - 121 IU/L 156(H) 132(H)  168(H)  AST 0 - 40 IU/L 156(H) 34 448(H)  ALT 0 - 32 IU/L 338(H) 116(H) 1,186(HH)    Hepatic Function Latest Ref Rng & Units 12/08/2020 11/19/2020 11/03/2020  Total Protein 6.0 - 8.5 g/dL 7.8 7.3 7.5  Albumin 3.9 - 5.0 g/dL 4.4 4.3 4.1  AST 0 - 40 IU/L 156(H) 34 448(H)  ALT 0 - 32 IU/L 338(H) 116(H) 1,186(HH)  Alk Phosphatase 44 - 121 IU/L 156(H) 132(H) 168(H)  Total Bilirubin 0.0 - 1.2 mg/dL 0.6 0.5 1.2  Bilirubin, Direct 0.00 - 0.40 mg/dL - 0.29 -     Assessment:  #1.  Markedly elevated transaminases and bile acids on presentation starting at week 10 of  pregnancy felt to be due to cholestasis of pregnancy except it has occurred much earlier during the gestation.  Markers for other causes of hepatic injury are negative.  Work-up revealed cholelithiasis for which she is undergone cholecystectomy uneventfully. Pruritus is well controlled with current therapy.  If transaminases have returned to normal and bile acid is down may consider backing up on cholestyramine and hydroxyzine.   Plan:  Patient will go to the lab for LFTs and serum bile acids. Continue cholestyramine and hydroxyzine as before. Office visit in 3 months.

## 2021-01-01 LAB — TRICHOMONAS VAGINALIS, PROBE AMP: Trich vag by NAA: NEGATIVE

## 2021-01-06 ENCOUNTER — Other Ambulatory Visit: Payer: Self-pay

## 2021-01-06 ENCOUNTER — Ambulatory Visit: Payer: Medicaid Other | Admitting: *Deleted

## 2021-01-06 ENCOUNTER — Ambulatory Visit (HOSPITAL_BASED_OUTPATIENT_CLINIC_OR_DEPARTMENT_OTHER): Payer: Medicaid Other | Admitting: Genetic Counselor

## 2021-01-06 ENCOUNTER — Ambulatory Visit: Payer: Self-pay | Admitting: Genetic Counselor

## 2021-01-06 ENCOUNTER — Ambulatory Visit: Payer: Medicaid Other | Attending: Women's Health

## 2021-01-06 ENCOUNTER — Encounter: Payer: Self-pay | Admitting: *Deleted

## 2021-01-06 ENCOUNTER — Other Ambulatory Visit: Payer: Self-pay | Admitting: *Deleted

## 2021-01-06 VITALS — BP 123/76 | HR 94

## 2021-01-06 DIAGNOSIS — Z363 Encounter for antenatal screening for malformations: Secondary | ICD-10-CM | POA: Diagnosis not present

## 2021-01-06 DIAGNOSIS — B009 Herpesviral infection, unspecified: Secondary | ICD-10-CM

## 2021-01-06 DIAGNOSIS — O285 Abnormal chromosomal and genetic finding on antenatal screening of mother: Secondary | ICD-10-CM | POA: Insufficient documentation

## 2021-01-06 DIAGNOSIS — O289 Unspecified abnormal findings on antenatal screening of mother: Secondary | ICD-10-CM | POA: Diagnosis present

## 2021-01-06 DIAGNOSIS — Z315 Encounter for genetic counseling: Secondary | ICD-10-CM

## 2021-01-06 DIAGNOSIS — O358XX0 Maternal care for other (suspected) fetal abnormality and damage, not applicable or unspecified: Secondary | ICD-10-CM

## 2021-01-06 DIAGNOSIS — O35EXX0 Maternal care for other (suspected) fetal abnormality and damage, fetal genitourinary anomalies, not applicable or unspecified: Secondary | ICD-10-CM

## 2021-01-06 DIAGNOSIS — Z3A19 19 weeks gestation of pregnancy: Secondary | ICD-10-CM

## 2021-01-06 DIAGNOSIS — O99512 Diseases of the respiratory system complicating pregnancy, second trimester: Secondary | ICD-10-CM

## 2021-01-06 NOTE — Progress Notes (Signed)
01/06/2021  [place] [birth year] MRN: [id number] DOV: 01/06/2021  Ms. Alicia [name] presented to the [facility] for a genetics consultation regarding her abnormal noninvasive prenatal screening (NIPS) result. Ms. Alicia [name] was accompanied to her appointment by her partner, Gwenyth Allegra.   Indication for genetic counseling - NIPS high-risk for trisomy 23, trisomy 28, or triploidy due to low fetal fraction  Prenatal history  Ms. Alicia [name] is a G42P1011, 30 y.o. female. Her current pregnancy has completed [redacted]w[redacted]d (Estimated Date of Delivery: 06/02/21). Ms. Alicia [name] and her partner have a 52 year old son together. Ms. Alicia [name] has also had one miscarriage at 33 weeks' gestation.  Ms. Alicia [name] denied exposure to environmental toxins or chemical agents. She denied the use of alcohol, tobacco or street drugs. She reported taking Hydroxyzine, Ursodiol, Cholestyramine, and prenatal vitamins. She denied significant fevers and bleeding during the course of her pregnancy. She had COVID at 8 weeks' gestation. She reported a history of cholestasis for which she had a cholecystectomy. She also had a D&C for retained products of conception following her miscarriage in 2016. Her medical and surgical histories were otherwise noncontributory.  Family History  A three generation pedigree was drafted and reviewed. Both family histories were reviewed and found to be noncontributory for birth defects, intellectual disability, recurrent pregnancy loss, and known genetic conditions.    The patient's ancestry is [place] American The father of the pregnancy's ancestry is African Optometrist. Ashkenazi Jewish ancestry and consanguinity were denied. Pedigree will be scanned under Media.  Discussion  NIPS result:  Ms. Alicia [name] was referred for genetic counseling as she had Panorama noninvasive prenatal screening (NIPS) through [place] that was high risk due to insufficient fetal DNA fraction. Ms. Alicia [name]'s fetal  fraction was 2.3% at [redacted]w[redacted]d. Given the insufficient fetal fraction, an additional analysis was performed by the laboratory that identified the pregnancy as being at high risk (1 in 17, or ~6%) for triploidy, trisomy 74, and trisomy 69.      Panorama NIPS utilizes single nucleotide polymorphisms (SNPs) to distinguish maternal from fetal (placental) DNA. The term "fetal fraction" refers to the amount of sample that is believed to have come from fetal DNA rather than maternal DNA. We reviewed that there are many possible reasons a sample may have a low fetal fraction, including early gestational age, high maternal BMI, suboptimal sample collection, maternal use of medications like low molecular weight heparin, pregnancy loss, pregnancy complications, and normal variation. Pregnancies affected by triploidy, trisomy 21, and trisomy 47 have all been associated with low fetal fraction on NIPS as well; however, there is no increased incidence of trisomy 33 or monosomy X in cases with low fetal fraction. It is thought that pregnancies with triploidy, trisomy 5, and trisomy 18 may have smaller placentas, which could result in an insufficient fetal fraction. We briefly reviewed that triploidy, trisomy 61, and trisomy 31 are lethal conditions that impact various organ systems throughout the body. Fetuses with triploidy, trisomy 51, and trisomy 28 often pass away during pregnancy; affected infants often pass away very shortly after birth, and most do not survive beyond 30 year of age.  Ultrasound:  A complete ultrasound was performed today prior to our visit. The ultrasound report will be sent under separate cover. Mild left pyelectasis was noted on today's ultrasound.   We discussed that pyelectasis, or urinary tract dilation (UTD) is a mild enlargement of the central area, or "pelvis" of the kidney. The increase in size may be the  result of urine not being able to flow freely from the kidney to the bladder due to  ureteropelvic junction obstruction. Urine can also back up from the bladder into the kidneys and cause dilation; this is known as vesicoureteral reflux. Ms. Alicia [name] was counseled that pyelectasis is considered a soft marker for Down syndrome. It is present in approximately 1-2% of chromosomally normal fetuses, but up to 17% of fetuses with Down syndrome. The likelihood ratio for fetal Down syndrome associated with pyelectasis is ~1.5 (Nyberg et al., 2001). Given her age-related risk to have a child with Down syndrome and the finding of pyelectasis, Ms. Alicia [name]'s adjusted risk for the current fetus to have Down syndrome is approximately 0.2%.   There were no other visualized fetal anomalies or markers suggestive of aneuploidy. Ms. Alicia [name] was counseled that ~50% of fetuses with Down syndrome and 90-95% of fetuses with trisomy 85, trisomy 80, or triploidy demonstrate a sign of the respective conditions on anatomy ultrasound. Today's ultrasound was reassuring, as fetuses with trisomy 15, trisomy 35, or triploidy often have more extensive anomalies than only mild pyelectasis.  Additional screening/testing options:  We discussedadditional testing options that are available to Ms. Alicia [name]. Firstly, we reviewed that redrawinga new sample for Panorama NIPS was possible. If the redraw was successful and contained a sufficient fetal fraction, results could clarify the risks for chromosomal aneuploidy in the current pregnancy.We discussed that it is also possible to pursue NIPS through a different laboratory that is able to analyze samples with lower fetal fraction, such as [facility]. However, [facility] cannot assess for triploidy on NIPS.  Ms. Alicia [name] was also counseled regarding diagnostic testing via amniocentesis. We discussed the technical aspects of the procedure and quoted up to a 1 in 500 (0.2%) risk for spontaneous pregnancy loss or other adverse pregnancy outcomes as a result of amniocentesis. Cultured cells from  an amniocentesis sample allow for the visualization of a fetal karyotype, which can detect >99% of large chromosomal aberrations. Chromosomal microarray can also be performed to identify smaller deletions or duplications of fetal chromosomal material. Ms. Daddario was informed that amniocentesis is the only way to definitively determine if the fetus has a chromosomal abnormality prenatally.   After careful consideration, Ms. Berthelot declined both a redraw for NIPS and amniocentesis at this time. She understands that NIPS remains an option through the end of pregnancy and that amniocentesis is available at any point after 16 weeks should she change her mind.  Carrier screening:  Finally, per ACOG recommendation, carrier screening for hemoglobinopathies, cystic fibrosis (CF) and spinal muscular atrophy (SMA) was discussed including information about the conditions, rationale for testing, autosomal recessive inheritance, and the option of prenatal diagnosis. Ms. Wist had carrier screening for 86 of the most common CF-related variants and sickle cell screening in her prior pregnancy, both of which were negative (with remaining residual risks). I offered additional carrier screening for SMA, which Ms. Killian declined at this time. Without carrier screening to refine risk and based on ethnicity alone, Ms. Siemen's chance of being a carrier for SMA is 1 in 46. Ms. Chrisley was informed that CF, SMA, and select hemoglobinopathies are included on [place]'s newborn screen.   Plan:  Additional screening and diagnostic testing were declined today. Instead of having a redraw for NIPS or an amniocentesis, Ms. Padgett prefers to follow the pregnancy via ultrasound. She understands that screening tests, including ultrasound, cannot rule out all birth defects or genetic syndromes.   I counseled Ms. Twilley regarding  the above risks and available options. The approximate face-to-face time with the genetic counselor was  30 minutes.  In summary:  Discussed NIPS result  1 in 17 (~6%) risk for trisomy 8, trisomy 24, or triploidy due to low fetal fraction  Declined NIPS redraw  Reviewed results of ultrasound  Mild left pyelectasis noted  No other fetal anomalies or markers seen  Offered additional testing and screening  Declined amniocentesis  Declined carrier screening for SMA  Reviewed family history concerns   Buelah Manis, MS, Counselling psychologist

## 2021-01-14 ENCOUNTER — Telehealth: Payer: Self-pay | Admitting: Advanced Practice Midwife

## 2021-01-14 NOTE — Telephone Encounter (Signed)
Patient called stating that she came into the office not to long ago about a problem she was having and she perscribed a medication. Patient states that she took her medication and it went away, but now it has return and she would like to know if Manus Gunning could call her in the medication again. Patient states she uses [facility]. Please contact pt when sent.

## 2021-01-15 ENCOUNTER — Other Ambulatory Visit: Payer: Medicaid Other

## 2021-01-15 ENCOUNTER — Other Ambulatory Visit: Payer: Self-pay

## 2021-01-15 ENCOUNTER — Other Ambulatory Visit (HOSPITAL_COMMUNITY)
Admission: RE | Admit: 2021-01-15 | Discharge: 2021-01-15 | Disposition: A | Discharge status: Home / Self Care | Payer: Medicaid Other | Source: Ambulatory Visit | Attending: Obstetrics & Gynecology | Admitting: Obstetrics & Gynecology

## 2021-01-15 VITALS — BP 107/69 | HR 107 | Wt 133.8 lb

## 2021-01-15 DIAGNOSIS — N898 Other specified noninflammatory disorders of vagina: Secondary | ICD-10-CM

## 2021-01-15 NOTE — Progress Notes (Signed)
   NURSE VISIT- VAGINITIS/STD/POC  SUBJECTIVE:  Alicia [name] is a 30 y.o. G3P1011 [redacted]w[redacted]d pregnantfemale here for a vaginal swab for vaginitis screening.  She reports the following symptoms: discharge described as white and malodorous for 3 days. Denies abnormal vaginal bleeding, significant pelvic pain, fever, or UTI symptoms.  OBJECTIVE:  BP 107/69 (BP Location: Right Arm, Patient Position: Sitting, Cuff Size: Normal)   Pulse (!) 107   Wt 133 lb 12.8 oz (60.7 kg)   LMP 08/26/2020 (Exact Date)   BMI 24.47 kg/m   Appears well, in no apparent distress  ASSESSMENT: Vaginal swab for vaginitis screening  PLAN: Self-collected vaginal probe for Bacterial Vaginosis, Yeast, Gonorrhea, Chlamydia, Trichomonas sent to lab Treatment: to be determined once results are received Follow-up as needed if symptoms persist/worsen, or new symptoms develop  Malessa Zartman A Markhi Kleckner  01/15/2021 11:20 AM

## 2021-01-15 NOTE — Progress Notes (Signed)
Chart reviewed for nurse visit. Agree with plan of care.  Estill Dooms, NP 01/15/2021 11:22 AM

## 2021-01-19 ENCOUNTER — Other Ambulatory Visit: Payer: Self-pay | Admitting: Adult Health

## 2021-01-19 LAB — CERVICOVAGINAL ANCILLARY ONLY
Bacterial Vaginitis (gardnerella): POSITIVE — AB
Candida Glabrata: NEGATIVE
Candida Vaginitis: POSITIVE — AB
Chlamydia: NEGATIVE
Comment: NEGATIVE
Comment: NEGATIVE
Comment: NEGATIVE
Comment: NEGATIVE
Comment: NEGATIVE
Comment: NORMAL
Neisseria Gonorrhea: NEGATIVE
Trichomonas: NEGATIVE

## 2021-01-19 MED ORDER — METRONIDAZOLE 500 MG PO TABS
500.0000 mg | ORAL_TABLET | Freq: Two times a day (BID) | ORAL | 0 refills | Status: DC
Start: 2021-01-19 — End: 2021-02-09

## 2021-01-19 NOTE — Progress Notes (Signed)
+  BV and yeast on vaginal swab will rx flagyl and monistat

## 2021-01-27 ENCOUNTER — Encounter: Payer: Medicaid Other | Admitting: Advanced Practice Midwife

## 2021-01-28 ENCOUNTER — Telehealth: Payer: Self-pay | Admitting: Obstetrics & Gynecology

## 2021-01-28 NOTE — Telephone Encounter (Signed)
Patient wants to know if she can get a doctor's note to have restrictions about not lifting heavy items or products. Per patient. Clinical staff will follow up with patient.

## 2021-01-28 NOTE — Telephone Encounter (Signed)
Printed a note and sent a copy to MyChart for pt. Called pt to inform her of the note. Pt confirmed understanding.

## 2021-02-09 ENCOUNTER — Ambulatory Visit (INDEPENDENT_AMBULATORY_CARE_PROVIDER_SITE_OTHER): Payer: Medicaid Other | Admitting: Women's Health

## 2021-02-09 ENCOUNTER — Other Ambulatory Visit: Payer: Self-pay

## 2021-02-09 ENCOUNTER — Encounter: Payer: Self-pay | Admitting: Women's Health

## 2021-02-09 VITALS — BP 106/70 | HR 89 | Wt 136.0 lb

## 2021-02-09 DIAGNOSIS — O26619 Liver and biliary tract disorders in pregnancy, unspecified trimester: Secondary | ICD-10-CM

## 2021-02-09 DIAGNOSIS — O285 Abnormal chromosomal and genetic finding on antenatal screening of mother: Secondary | ICD-10-CM

## 2021-02-09 DIAGNOSIS — O0992 Supervision of high risk pregnancy, unspecified, second trimester: Secondary | ICD-10-CM

## 2021-02-09 DIAGNOSIS — K831 Obstruction of bile duct: Secondary | ICD-10-CM

## 2021-02-09 NOTE — Patient Instructions (Signed)
Alicia [name], I greatly value your feedback.  If you receive a survey following your visit with [place] today, we appreciate you taking the time to fill it out.  Thanks, Knute Neu, CNM, WHNP-BC   You will have your sugar test next visit.  Please do not eat or drink anything after midnight the night before you come, not even water.  You will be here for at least two hours.  Please make an appointment online for the bloodwork at [email or web address] for 8:30am (or as close to this as possible). Make sure you select the [facility] service center. The day of the appointment, check in with our office first, then you will go to [place] to start the sugar test.    [place] [facility] [place] [address]) Entrance C, located off of [address] parking  Go to [email or web address] to register for FREE online childbirth classes   Call the office [phone]) or go to [facility] if:  You begin to have strong, frequent contractions  Your water breaks.  Sometimes it is a big gush of fluid, sometimes it is just a trickle that keeps getting your panties wet or running down your legs  You have vaginal bleeding.  It is normal to have a small amount of spotting if your cervix was checked.   You don't feel your baby moving like normal.  If you don't, get you something to eat and drink and lay down and focus on feeling your baby move.   If your baby is still not moving like normal, you should call the office or go to [place] Pediatricians/Family Doctors:  [place] [phone]                 [place] [phone] (usually not accepting new patients unless you have family there already, you are always welcome to call and ask)       [facility] Department [phone]       [facility] Pediatricians/Family Doctors:   [facility]:  [phone]  [facility]: [phone]  [facility]: [place] Doctors:   [facility]: [place]: [place]:  [place]: [phone]   Home Blood Pressure Monitoring for Patients   Your provider has recommended that you check your blood pressure (BP) at least once a week at home. If you do not have a blood pressure cuff at home, one will be provided for you. Contact your provider if you have not received your monitor within 1 week.   Helpful Tips for Accurate Home Blood Pressure Checks  . Don't smoke, exercise, or drink caffeine 30 minutes before checking your BP . Use the restroom before checking your BP (a full bladder can raise your pressure) . Relax in a comfortable upright chair . Feet on the ground . Left arm resting comfortably on a flat surface at the level of your heart . Legs uncrossed . Back supported . Sit quietly and don't talk . Place the cuff on your bare arm . Adjust snuggly, so that only two fingertips can fit between your skin and the top of the cuff . Check 2 readings separated by at least one minute . Keep a log of your BP readings . For a visual, please  reference this diagram: http://ccnc.care/bpdiagram  Provider Name: [facility]     Phone: [phone]  Zone 1: ALL CLEAR  Continue to monitor your symptoms:  . BP reading is less than 140 (top number) or less than 90 (bottom number)  . No right upper stomach pain . No headaches or seeing spots . No feeling nauseated or throwing up . No swelling in face and hands  Zone 2: CAUTION Call your doctor's office for any of the following:  . BP reading is greater than 140 (top number) or greater than 90 (bottom number)  . Stomach pain under your ribs in the middle or right side . Headaches or seeing spots . Feeling nauseated or throwing up . Swelling in  face and hands  Zone 3: EMERGENCY  Seek immediate medical care if you have any of the following:  . BP reading is greater than160 (top number) or greater than 110 (bottom number) . Severe headaches not improving with Tylenol . Serious difficulty catching your breath . Any worsening symptoms from Zone 2   Second Trimester of Pregnancy The second trimester is from week 13 through week 28, months 4 through 6. The second trimester is often a time when you feel your best. Your body has also adjusted to being pregnant, and you begin to feel better physically. Usually, morning sickness has lessened or quit completely, you may have more energy, and you may have an increase in appetite. The second trimester is also a time when the fetus is growing rapidly. At the end of the sixth month, the fetus is about 9 inches long and weighs about 1 pounds. You will likely begin to feel the baby move (quickening) between 18 and 20 weeks of the pregnancy. BODY CHANGES Your body goes through many changes during pregnancy. The changes vary from woman to woman.   Your weight will continue to increase. You will notice your lower abdomen bulging out.  You may begin to get stretch marks on your hips, abdomen, and breasts.  You may develop headaches that can be relieved by medicines approved by your health care provider.  You may urinate more often because the fetus is pressing on your bladder.  You may develop or continue to have heartburn as a result of your pregnancy.  You may develop constipation because certain hormones are causing the muscles that push waste through your intestines to slow down.  You may develop hemorrhoids or swollen, bulging veins (varicose veins).  You may have back pain because of the weight gain and pregnancy hormones relaxing your joints between the bones in your pelvis and as a result of a shift in weight and the muscles that support your balance.  Your breasts will continue to grow  and be tender.  Your gums may bleed and may be sensitive to brushing and flossing.  Dark spots or blotches (chloasma, mask of pregnancy) may develop on your face. This will likely fade after the baby is born.  A dark line from your belly button to the pubic area (linea nigra) may appear. This will likely fade after the baby is born.  You may have changes in your hair. These can include thickening of your hair, rapid growth, and changes in texture. Some women also have hair loss during or after pregnancy, or hair that feels dry or thin. Your hair will most likely return to normal after your baby is born. WHAT TO EXPECT AT YOUR PRENATAL VISITS During a routine prenatal visit:  You  will be weighed to make sure you and the fetus are growing normally.  Your blood pressure will be taken.  Your abdomen will be measured to track your baby's growth.  The fetal heartbeat will be listened to.  Any test results from the previous visit will be discussed. Your health care provider may ask you:  How you are feeling.  If you are feeling the baby move.  If you have had any abnormal symptoms, such as leaking fluid, bleeding, severe headaches, or abdominal cramping.  If you have any questions. Other tests that may be performed during your second trimester include:  Blood tests that check for:  Low iron levels (anemia).  Gestational diabetes (between 24 and 28 weeks).  Rh antibodies.  Urine tests to check for infections, diabetes, or protein in the urine.  An ultrasound to confirm the proper growth and development of the baby.  An amniocentesis to check for possible genetic problems.  Fetal screens for spina bifida and Down syndrome. HOME CARE INSTRUCTIONS   Avoid all smoking, herbs, alcohol, and unprescribed drugs. These chemicals affect the formation and growth of the baby.  Follow your health care provider's instructions regarding medicine use. There are medicines that are either  safe or unsafe to take during pregnancy.  Exercise only as directed by your health care provider. Experiencing uterine cramps is a good sign to stop exercising.  Continue to eat regular, healthy meals.  Wear a good support bra for breast tenderness.  Do not use hot tubs, steam rooms, or saunas.  Wear your seat belt at all times when driving.  Avoid raw meat, uncooked cheese, cat litter boxes, and soil used by cats. These carry germs that can cause birth defects in the baby.  Take your prenatal vitamins.  Try taking a stool softener (if your health care provider approves) if you develop constipation. Eat more high-fiber foods, such as fresh vegetables or fruit and whole grains. Drink plenty of fluids to keep your urine clear or pale yellow.  Take warm sitz baths to soothe any pain or discomfort caused by hemorrhoids. Use hemorrhoid cream if your health care provider approves.  If you develop varicose veins, wear support hose. Elevate your feet for 15 minutes, 3-4 times a day. Limit salt in your diet.  Avoid heavy lifting, wear low heel shoes, and practice good posture.  Rest with your legs elevated if you have leg cramps or low back pain.  Visit your dentist if you have not gone yet during your pregnancy. Use a soft toothbrush to brush your teeth and be gentle when you floss.  A sexual relationship may be continued unless your health care provider directs you otherwise.  Continue to go to all your prenatal visits as directed by your health care provider. SEEK MEDICAL CARE IF:   You have dizziness.  You have mild pelvic cramps, pelvic pressure, or nagging pain in the abdominal area.  You have persistent nausea, vomiting, or diarrhea.  You have a bad smelling vaginal discharge.  You have pain with urination. SEEK IMMEDIATE MEDICAL CARE IF:   You have a fever.  You are leaking fluid from your vagina.  You have spotting or bleeding from your vagina.  You have severe  abdominal cramping or pain.  You have rapid weight gain or loss.  You have shortness of breath with chest pain.  You notice sudden or extreme swelling of your face, hands, ankles, feet, or legs.  You have not felt your baby move  in over an hour.  You have severe headaches that do not go away with medicine.  You have vision changes. Document Released: 10/04/2001 Document Revised: 10/15/2013 Document Reviewed: 12/11/2012 [facility] Patient Information 2015 [place], [place]. This information is not intended to replace advice given to you by your health care provider. Make sure you discuss any questions you have with your health care provider.

## 2021-02-09 NOTE — Progress Notes (Signed)
HIGH-RISK PREGNANCY VISIT Patient name: Alicia [name] MRN [id number]  Date of birth: October 22, 1991 Chief Complaint:   Routine Prenatal Visit  History of Present Illness:   Alicia [name] is a 30 y.o. 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  S/P cholecystectomy on 2/23. Today she reports itching much improved, still some on legs/arms, but not on palms/soles.  Depression screen [facility] 2/9 12/08/2020 01/28/2020 03/07/2018 11/02/2017  Decreased Interest 0 0 0 0  Down, Depressed, Hopeless 0 0 0 0  PHQ - 2 Score 0 0 0 0  Altered sleeping 3 0 - -  Tired, decreased energy 0 0 - -  Change in appetite 2 0 - -  Feeling bad or failure about yourself  0 0 - -  Trouble concentrating 0 0 - -  Moving slowly or fidgety/restless 0 0 - -  Suicidal thoughts 0 0 - -  PHQ-9 Score 5 0 - -    Contractions: Not present. Vag. Bleeding: None.  Movement: Present. denies leaking of fluid.  Review of Systems:   Pertinent items are noted in HPI Denies abnormal vaginal discharge w/ itching/odor/irritation, headaches, visual changes, shortness of breath, chest pain, abdominal pain, severe nausea/vomiting, or problems with urination or bowel movements unless otherwise stated above. Pertinent History Reviewed:  Reviewed past medical,surgical, social, obstetrical and family history.  Reviewed problem list, medications and allergies. Physical Assessment:   Vitals:   02/09/21 1154  BP: 106/70  Pulse: 89  Weight: 136 lb (61.7 kg)  Body mass index is 24.87 kg/m.           Physical Examination:   General appearance: alert, well appearing, and in no distress  Mental status: alert, oriented to person, place, and  time  Skin: warm & dry   Extremities: Edema: None    Cardiovascular: normal heart rate noted  Respiratory: normal respiratory effort, no distress  Abdomen: gravid, soft, non-tender  Pelvic: Cervical exam deferred         Fetal Status: Fetal Heart Rate (bpm): 150 Fundal Height: 23 cm Movement: Present    Fetal Surveillance Testing today: doppler   Chaperone: N/A    No results found for this or any previous visit (from the past 24 hour(s)).  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 Hasn't repeated bile acids/cmp since surgery. To come in am for fasting labs (orders already in).   2) NIPS w/ low fetal fraction and increased r/f T18/T13/triploidy, had appt w/ MFM and genetic counselor, declined diagnostic testing, detailed u/s w/ mild Lt RPD, otherwise normal, has another u/s w/ them on 4/26  Meds: No orders of the defined types were placed in this encounter.   Labs/procedures today: fasting bile acid and CMP in am  Treatment Plan:  [place] q4wks     Weekly BPP @ 28wks then 2x/wk testing @ 32wks or weekly BPP     Deliver 37wks or as per MFM:____   Reviewed: Preterm labor symptoms and general obstetric precautions including but not limited to vaginal bleeding, contractions, leaking of fluid and  fetal movement were reviewed in detail with the patient.  All questions were answered. Does have home bp cuff. Office bp cuff given: not applicable. Check bp weekly, let us know if consistently >140 and/or >90.  Follow-up: Return in about 4 weeks (around 03/09/2021) for HROB, PN2, in person, MD or CNM.   Future Appointments  Date Time Provider [place]  02/16/2021  9:00 AM [facility] NURSE [facility]  02/16/2021  9:15 AM [facility] US2 [facility]  03/09/2021  9:00 AM [facility]  03/09/2021  9:50  AM Eure, Mertie Clause, MD [facility]  04/27/2021  3:00 PM Rehman, Mechele Dawley, MD [facility] None    No orders of the defined types were placed in this encounter.  [place], [facility] 02/09/2021 12:49 PM

## 2021-02-16 ENCOUNTER — Encounter: Payer: Self-pay | Admitting: *Deleted

## 2021-02-16 ENCOUNTER — Other Ambulatory Visit: Payer: Self-pay

## 2021-02-16 ENCOUNTER — Ambulatory Visit: Payer: Medicaid Other | Admitting: *Deleted

## 2021-02-16 ENCOUNTER — Other Ambulatory Visit: Payer: Self-pay | Admitting: *Deleted

## 2021-02-16 ENCOUNTER — Ambulatory Visit: Payer: Medicaid Other | Attending: Obstetrics and Gynecology

## 2021-02-16 VITALS — BP 106/63 | HR 93

## 2021-02-16 DIAGNOSIS — O289 Unspecified abnormal findings on antenatal screening of mother: Secondary | ICD-10-CM

## 2021-02-16 DIAGNOSIS — O358XX0 Maternal care for other (suspected) fetal abnormality and damage, not applicable or unspecified: Secondary | ICD-10-CM

## 2021-02-16 DIAGNOSIS — O98512 Other viral diseases complicating pregnancy, second trimester: Secondary | ICD-10-CM | POA: Diagnosis not present

## 2021-02-16 DIAGNOSIS — O35EXX0 Maternal care for other (suspected) fetal abnormality and damage, fetal genitourinary anomalies, not applicable or unspecified: Secondary | ICD-10-CM

## 2021-02-16 DIAGNOSIS — Z363 Encounter for antenatal screening for malformations: Secondary | ICD-10-CM

## 2021-02-16 DIAGNOSIS — Z3A24 24 weeks gestation of pregnancy: Secondary | ICD-10-CM

## 2021-02-16 DIAGNOSIS — B009 Herpesviral infection, unspecified: Secondary | ICD-10-CM | POA: Diagnosis not present

## 2021-02-16 DIAGNOSIS — O321XX0 Maternal care for breech presentation, not applicable or unspecified: Secondary | ICD-10-CM

## 2021-03-09 ENCOUNTER — Other Ambulatory Visit: Payer: Self-pay

## 2021-03-09 ENCOUNTER — Ambulatory Visit (INDEPENDENT_AMBULATORY_CARE_PROVIDER_SITE_OTHER): Payer: Medicaid Other | Admitting: Obstetrics & Gynecology

## 2021-03-09 ENCOUNTER — Other Ambulatory Visit: Payer: Medicaid Other

## 2021-03-09 ENCOUNTER — Encounter: Payer: Self-pay | Admitting: Obstetrics & Gynecology

## 2021-03-09 VITALS — BP 105/66 | HR 89 | Wt 137.0 lb

## 2021-03-09 DIAGNOSIS — Z23 Encounter for immunization: Secondary | ICD-10-CM

## 2021-03-09 DIAGNOSIS — O0993 Supervision of high risk pregnancy, unspecified, third trimester: Secondary | ICD-10-CM

## 2021-03-09 DIAGNOSIS — Z3A27 27 weeks gestation of pregnancy: Secondary | ICD-10-CM

## 2021-03-09 DIAGNOSIS — Z1389 Encounter for screening for other disorder: Secondary | ICD-10-CM

## 2021-03-09 DIAGNOSIS — K831 Obstruction of bile duct: Secondary | ICD-10-CM

## 2021-03-09 DIAGNOSIS — O26619 Liver and biliary tract disorders in pregnancy, unspecified trimester: Secondary | ICD-10-CM

## 2021-03-09 DIAGNOSIS — O0990 Supervision of high risk pregnancy, unspecified, unspecified trimester: Secondary | ICD-10-CM

## 2021-03-09 LAB — POCT URINALYSIS DIPSTICK OB
Blood, UA: NEGATIVE
Ketones, UA: NEGATIVE
Leukocytes, UA: NEGATIVE
Nitrite, UA: NEGATIVE

## 2021-03-09 NOTE — Progress Notes (Signed)
Patient ID: Alicia [name], female   DOB: 03-12-1991, 30 y.o.   MRN: [id number]   [facility] PREGNANCY VISIT Patient name: Alicia [name] MRN [id number]  Date of birth: [birth year] Chief Complaint:   Routine Prenatal Visit  History of Present Illness:   Alicia [name] is a 30 y.o. G63P1011 female at [redacted]w[redacted]d with an Estimated Date of Delivery: 06/02/21 being seen today for ongoing management of a high-risk pregnancy complicated by ICP, cholecystitis.  Today she reports no complaints.  Depression screen [facility] 2/9 03/09/2021 12/08/2020 01/28/2020 03/07/2018 11/02/2017  Decreased Interest 0 0 0 0 0  Down, Depressed, Hopeless 0 0 0 0 0  PHQ - 2 Score 0 0 0 0 0  Altered sleeping 0 3 0 - -  Tired, decreased energy 0 0 0 - -  Change in appetite 0 2 0 - -  Feeling bad or failure about yourself  0 0 0 - -  Trouble concentrating 0 0 0 - -  Moving slowly or fidgety/restless 0 0 0 - -  Suicidal thoughts 0 0 0 - -  PHQ-9 Score 0 5 0 - -    Contractions: Irritability. Vag. Bleeding: None.  Movement: Present. denies leaking of fluid.  Review of Systems:   Pertinent items are noted in HPI Denies abnormal vaginal discharge w/ itching/odor/irritation, headaches, visual changes, shortness of breath, chest pain, abdominal pain, severe nausea/vomiting, or problems with urination or bowel movements unless otherwise stated above. Pertinent History Reviewed:  Reviewed past medical,surgical, social, obstetrical and family history.  Reviewed problem list, medications and allergies. Physical Assessment:   Vitals:   03/09/21 1001  BP: 105/66  Pulse: 89  Weight: 137 lb (62.1 kg)  Body mass index is 25.06 kg/m.           Physical Examination:   General appearance: alert, well appearing, and in no distress  Mental status: alert, oriented to person, place, and time  Skin: warm & dry   Extremities: Edema: None    Cardiovascular: normal heart rate noted  Respiratory: normal respiratory effort, no  distress  Abdomen: gravid, soft, non-tender  Pelvic: Cervical exam deferred         Fetal Status:     Movement: Present    Fetal Surveillance Testing today: FHR 152   Chaperone: N/A    Results for orders placed or performed in visit on 03/09/21 (from the past 24 hour(s))  POC Urinalysis Dipstick OB   Collection Time: 03/09/21 10:18 AM  Result Value Ref Range   Color, UA     Clarity, UA     Glucose, UA Small (1+) (A) Negative   Bilirubin, UA     Ketones, UA neg    Spec Grav, UA     Blood, UA neg    pH, UA     POC,PROTEIN,UA Trace Negative, Trace, Small (1+), Moderate (2+), Large (3+), 4+   Urobilinogen, UA     Nitrite, UA neg    Leukocytes, UA Negative Negative   Appearance     Odor      Assessment & Plan:  High-risk pregnancy: G3P1011 at [redacted]w[redacted]d with an Estimated Date of Delivery: 06/02/21   1) ICP, no itching now on actigall 900 TID/questran 4 grams BID, stable, check bile acids and LFTs today  2) elevated T 13/18 risk with normal sonogram   Meds: No orders of the defined types were placed in this encounter.   Labs/procedures today: none  Treatment Plan:  Begin twice weekly testing at  32 weeks  Reviewed: Preterm labor symptoms and general obstetric precautions including but not limited to vaginal bleeding, contractions, leaking of fluid and fetal movement were reviewed in detail with the patient.  All questions were answered. Does have home bp cuff. Office bp cuff given: not applicable. Check bp weekly, let us know if consistently >140 and/or >90.  Follow-up: Return in about 3 weeks (around 03/30/2021) for [place].   Future Appointments  Date Time Provider [place]  03/23/2021  9:45 AM [facility] NURSE [facility]  03/23/2021 10:00 AM [facility] US1 [facility]  04/27/2021  3:00 PM Rehman, Mechele Dawley, MD [facility] None    Orders Placed This Encounter  Procedures  . Tdap vaccine greater than or equal to 7yo IM  . Bile acids, total  . Comp Met (CMET)  . POC Urinalysis  Dipstick OB   Alicia [name]  03/09/2021 10:53 AM

## 2021-03-10 LAB — CBC
Hematocrit: 32.4 % — ABNORMAL LOW (ref 34.0–46.6)
Hemoglobin: 11 g/dL — ABNORMAL LOW (ref 11.1–15.9)
MCH: 29.9 pg (ref 26.6–33.0)
MCHC: 34 g/dL (ref 31.5–35.7)
MCV: 88 fL (ref 79–97)
Platelets: 253 10*3/uL (ref 150–450)
RBC: 3.68 x10E6/uL — ABNORMAL LOW (ref 3.77–5.28)
RDW: 13.1 % (ref 11.7–15.4)
WBC: 6.7 10*3/uL (ref 3.4–10.8)

## 2021-03-10 LAB — RPR: RPR Ser Ql: NONREACTIVE

## 2021-03-10 LAB — GLUCOSE TOLERANCE, 2 HOURS W/ 1HR
Glucose, 1 hour: 152 mg/dL (ref 65–179)
Glucose, 2 hour: 106 mg/dL (ref 65–152)
Glucose, Fasting: 79 mg/dL (ref 65–91)

## 2021-03-10 LAB — ANTIBODY SCREEN: Antibody Screen: NEGATIVE

## 2021-03-10 LAB — HIV ANTIBODY (ROUTINE TESTING W REFLEX): HIV Screen 4th Generation wRfx: NONREACTIVE

## 2021-03-11 LAB — COMPREHENSIVE METABOLIC PANEL
ALT: 36 IU/L — ABNORMAL HIGH (ref 0–32)
AST: 18 IU/L (ref 0–40)
Albumin/Globulin Ratio: 1.4 (ref 1.2–2.2)
Albumin: 4.2 g/dL (ref 3.9–5.0)
Alkaline Phosphatase: 127 IU/L — ABNORMAL HIGH (ref 44–121)
BUN/Creatinine Ratio: 8 — ABNORMAL LOW (ref 9–23)
BUN: 5 mg/dL — ABNORMAL LOW (ref 6–20)
Bilirubin Total: 0.3 mg/dL (ref 0.0–1.2)
CO2: 18 mmol/L — ABNORMAL LOW (ref 20–29)
Calcium: 9.5 mg/dL (ref 8.7–10.2)
Chloride: 101 mmol/L (ref 96–106)
Creatinine, Ser: 0.6 mg/dL (ref 0.57–1.00)
Globulin, Total: 3.1 g/dL (ref 1.5–4.5)
Glucose: 99 mg/dL (ref 65–99)
Potassium: 3.5 mmol/L (ref 3.5–5.2)
Sodium: 135 mmol/L (ref 134–144)
Total Protein: 7.3 g/dL (ref 6.0–8.5)
eGFR: 124 mL/min/{1.73_m2} (ref >59)

## 2021-03-11 LAB — BILE ACIDS, TOTAL: Bile Acids Total: 18.7 umol/L — ABNORMAL HIGH (ref 0.0–10.0)

## 2021-03-23 ENCOUNTER — Other Ambulatory Visit: Payer: Self-pay | Admitting: *Deleted

## 2021-03-23 ENCOUNTER — Ambulatory Visit: Payer: Medicaid Other | Admitting: *Deleted

## 2021-03-23 ENCOUNTER — Other Ambulatory Visit: Payer: Self-pay

## 2021-03-23 ENCOUNTER — Encounter: Payer: Self-pay | Admitting: *Deleted

## 2021-03-23 ENCOUNTER — Ambulatory Visit: Payer: Medicaid Other | Attending: Obstetrics

## 2021-03-23 VITALS — BP 101/62 | HR 96

## 2021-03-23 DIAGNOSIS — O98513 Other viral diseases complicating pregnancy, third trimester: Secondary | ICD-10-CM

## 2021-03-23 DIAGNOSIS — O358XX0 Maternal care for other (suspected) fetal abnormality and damage, not applicable or unspecified: Secondary | ICD-10-CM | POA: Insufficient documentation

## 2021-03-23 DIAGNOSIS — O35EXX0 Maternal care for other (suspected) fetal abnormality and damage, fetal genitourinary anomalies, not applicable or unspecified: Secondary | ICD-10-CM

## 2021-03-23 DIAGNOSIS — O281 Abnormal biochemical finding on antenatal screening of mother: Secondary | ICD-10-CM | POA: Diagnosis not present

## 2021-03-23 DIAGNOSIS — K831 Obstruction of bile duct: Secondary | ICD-10-CM

## 2021-03-23 DIAGNOSIS — Z363 Encounter for antenatal screening for malformations: Secondary | ICD-10-CM

## 2021-03-23 DIAGNOSIS — Z3A29 29 weeks gestation of pregnancy: Secondary | ICD-10-CM

## 2021-03-23 DIAGNOSIS — B009 Herpesviral infection, unspecified: Secondary | ICD-10-CM

## 2021-03-29 ENCOUNTER — Encounter: Payer: Self-pay | Admitting: Women's Health

## 2021-03-30 ENCOUNTER — Encounter: Payer: Self-pay | Admitting: Women's Health

## 2021-03-30 ENCOUNTER — Ambulatory Visit (INDEPENDENT_AMBULATORY_CARE_PROVIDER_SITE_OTHER): Payer: Medicaid Other | Admitting: Women's Health

## 2021-03-30 ENCOUNTER — Other Ambulatory Visit: Payer: Self-pay

## 2021-03-30 VITALS — BP 105/71 | HR 92 | Wt 136.0 lb

## 2021-03-30 DIAGNOSIS — O26613 Liver and biliary tract disorders in pregnancy, third trimester: Secondary | ICD-10-CM

## 2021-03-30 DIAGNOSIS — O0993 Supervision of high risk pregnancy, unspecified, third trimester: Secondary | ICD-10-CM

## 2021-03-30 DIAGNOSIS — K831 Obstruction of bile duct: Secondary | ICD-10-CM

## 2021-03-30 NOTE — Patient Instructions (Signed)
[name], thank you for choosing our office today! We appreciate the opportunity to meet your healthcare needs. You may receive a short survey by mail, e-mail, or through [employer]. If you are happy with your care we would appreciate if you could take just a few minutes to complete the survey questions. We read all of your comments and take your feedback very seriously. Thank you again for choosing our office.  Center for [employer] Team at [place] at [facility] ([place], [place] [address]) Entrance C, located off of [address] parking   CLASSES: Go to [email or web address] to register for classes (childbirth, breastfeeding, waterbirth, infant CPR, daddy bootcamp, etc.)  Call the office [phone]) or go to [facility] if:  You begin to have strong, frequent contractions  Your water breaks.  Sometimes it is a big gush of fluid, sometimes it is just a trickle that keeps getting your panties wet or running down your legs  You have vaginal bleeding.  It is normal to have a small amount of spotting if your cervix was checked.   You don't feel your baby moving like normal.  If you don't, get you something to eat and drink and lay down and focus on feeling your baby move.   If your baby is still not moving like normal, you should call the office or go to [facility].  Call the office [phone]) or go to [facility] hospital for these signs of pre-eclampsia:  Severe headache that does not go away with Tylenol  Visual changes- seeing spots, double, blurred vision  Pain under your right breast or upper abdomen that does not go away with Tums or heartburn medicine  Nausea and/or vomiting  Severe swelling in your hands, feet, and face   Tdap Vaccine  It is recommended that you get the Tdap vaccine during the third trimester of EACH pregnancy to help protect your baby from getting pertussis (whooping cough)  27-36 weeks  is the BEST time to do this so that you can pass the protection on to your baby. During pregnancy is better than after pregnancy, but if you are unable to get it during pregnancy it will be offered at the hospital.   You can get this vaccine with [place], at the health department, your family doctor, or some local pharmacies  Everyone who will be around your baby should also be up-to-date on their vaccines before the baby comes. Adults (who are not pregnant) only need 1 dose of Tdap during adulthood.   [facility] Pediatricians/Family Doctors  [facility]): [address] [place], [facility]: [address] [place], [phone]                 [facility]): [place], [phone] (call to ask if accepting patients)  [facility]: [address], [place], [place] Pediatricians/Family Doctors  [facility]): [place] [place], [address], [place] [facility]: [address] [place], [place]  [facility]: [place], [place] [facility]): [phone]  [facility]: [address], [place]: [address] [place]  . [place]  [facility]: [address], [phone]  Home Blood Pressure Monitoring for Patients   Your provider has recommended that you check your blood pressure (BP) at least once a week at home. If you do not have a blood pressure cuff at home, one will be provided for you. Contact your provider if you have not received your monitor within 1 week.   Helpful Tips for Accurate Home Blood Pressure Checks  . Don't smoke, exercise, or drink caffeine 30 minutes before checking your BP . Use the restroom  before checking your BP (a full bladder can raise your pressure) . Relax in a comfortable upright chair . Feet on the ground . Left arm resting comfortably on a flat surface at the level of your heart . Legs uncrossed . Back supported . Sit quietly and don't talk . Place the cuff on your bare arm . Adjust snuggly, so that only two fingertips can fit between your skin and the top of the cuff . Check 2 readings separated by at least one minute . Keep a log of your BP readings . For a visual, please reference this diagram: http://ccnc.care/bpdiagram  Provider Name: [facility]     Phone: [phone]  Zone 1: ALL CLEAR  Continue to monitor your symptoms:  . BP reading is less than 140 (top number) or less than 90 (bottom number)  . No right upper stomach pain . No headaches or seeing spots . No feeling nauseated or throwing up . No swelling in face and hands  Zone 2: CAUTION Call your doctor's office for any of the following:  . BP reading is greater than 140 (top number) or greater than 90 (bottom number)  . Stomach pain under your ribs in the middle or right side . Headaches or seeing spots . Feeling nauseated or throwing up . Swelling in face and hands  Zone 3: EMERGENCY  Seek immediate medical care if you have any of the following:  . BP reading is greater than160 (top number) or greater than 110 (bottom number) . Severe headaches not improving with Tylenol . Serious difficulty catching your breath . Any worsening symptoms from Zone 2   Third Trimester of Pregnancy The third trimester is from week 29 through week 42, months 7 through 9. The third trimester is a time when the fetus is growing rapidly. At the end of the ninth month, the fetus is about 20 inches in length and weighs 6-10 pounds.  BODY CHANGES Your body goes through many changes during pregnancy. The changes vary from woman to woman.   Your weight will continue to increase. You can expect to gain 25-35  pounds (11-16 kg) by the end of the pregnancy.  You may begin to get stretch marks on your hips, abdomen, and breasts.  You may urinate more often because the fetus is moving lower into your pelvis and pressing on your bladder.  You may develop or continue to have heartburn as a result of your pregnancy.  You may develop constipation because certain hormones are causing the muscles that push waste through your intestines to slow down.  You may develop hemorrhoids or swollen, bulging veins (varicose veins).  You may have pelvic pain because of the weight gain and pregnancy hormones relaxing your joints between the bones in your pelvis. Backaches may result from overexertion of the muscles supporting your posture.  You may have changes in your hair. These can include thickening of your hair, rapid growth, and changes  in texture. Some women also have hair loss during or after pregnancy, or hair that feels dry or thin. Your hair will most likely return to normal after your baby is born.  Your breasts will continue to grow and be tender. A yellow discharge may leak from your breasts called colostrum.  Your belly button may stick out.  You may feel short of breath because of your expanding uterus.  You may notice the fetus "dropping," or moving lower in your abdomen.  You may have a bloody mucus discharge. This usually occurs a few days to a week before labor begins.  Your cervix becomes thin and soft (effaced) near your due date. WHAT TO EXPECT AT YOUR PRENATAL EXAMS  You will have prenatal exams every 2 weeks until week 36. Then, you will have weekly prenatal exams. During a routine prenatal visit:  You will be weighed to make sure you and the fetus are growing normally.  Your blood pressure is taken.  Your abdomen will be measured to track your baby's growth.  The fetal heartbeat will be listened to.  Any test results from the previous visit will be discussed.  You may have a  cervical check near your due date to see if you have effaced. At around 36 weeks, your caregiver will check your cervix. At the same time, your caregiver will also perform a test on the secretions of the vaginal tissue. This test is to determine if a type of bacteria, Group B streptococcus, is present. Your caregiver will explain this further. Your caregiver may ask you:  What your birth plan is.  How you are feeling.  If you are feeling the baby move.  If you have had any abnormal symptoms, such as leaking fluid, bleeding, severe headaches, or abdominal cramping.  If you have any questions. Other tests or screenings that may be performed during your third trimester include:  Blood tests that check for low iron levels (anemia).  Fetal testing to check the health, activity level, and growth of the fetus. Testing is done if you have certain medical conditions or if there are problems during the pregnancy. FALSE LABOR You may feel small, irregular contractions that eventually go away. These are called Braxton Hicks contractions, or false labor. Contractions may last for hours, days, or even weeks before true labor sets in. If contractions come at regular intervals, intensify, or become painful, it is best to be seen by your caregiver.  SIGNS OF LABOR   Menstrual-like cramps.  Contractions that are 5 minutes apart or less.  Contractions that start on the top of the uterus and spread down to the lower abdomen and back.  A sense of increased pelvic pressure or back pain.  A watery or bloody mucus discharge that comes from the vagina. If you have any of these signs before the 37th week of pregnancy, call your caregiver right away. You need to go to the hospital to get checked immediately. HOME CARE INSTRUCTIONS   Avoid all smoking, herbs, alcohol, and unprescribed drugs. These chemicals affect the formation and growth of the baby.  Follow your caregiver's instructions regarding medicine  use. There are medicines that are either safe or unsafe to take during pregnancy.  Exercise only as directed by your caregiver. Experiencing uterine cramps is a good sign to stop exercising.  Continue to eat regular, healthy meals.  Wear a good support bra for breast tenderness.  Do not use hot tubs, steam rooms, or saunas.  Wear your  seat belt at all times when driving.  Avoid raw meat, uncooked cheese, cat litter boxes, and soil used by cats. These carry germs that can cause birth defects in the baby.  Take your prenatal vitamins.  Try taking a stool softener (if your caregiver approves) if you develop constipation. Eat more high-fiber foods, such as fresh vegetables or fruit and whole grains. Drink plenty of fluids to keep your urine clear or pale yellow.  Take warm sitz baths to soothe any pain or discomfort caused by hemorrhoids. Use hemorrhoid cream if your caregiver approves.  If you develop varicose veins, wear support hose. Elevate your feet for 15 minutes, 3-4 times a day. Limit salt in your diet.  Avoid heavy lifting, wear low heal shoes, and practice good posture.  Rest a lot with your legs elevated if you have leg cramps or low back pain.  Visit your dentist if you have not gone during your pregnancy. Use a soft toothbrush to brush your teeth and be gentle when you floss.  A sexual relationship may be continued unless your caregiver directs you otherwise.  Do not travel far distances unless it is absolutely necessary and only with the approval of your caregiver.  Take prenatal classes to understand, practice, and ask questions about the labor and delivery.  Make a trial run to the hospital.  Pack your hospital bag.  Prepare the baby's nursery.  Continue to go to all your prenatal visits as directed by your caregiver. SEEK MEDICAL CARE IF:  You are unsure if you are in labor or if your water has broken.  You have dizziness.  You have mild pelvic cramps,  pelvic pressure, or nagging pain in your abdominal area.  You have persistent nausea, vomiting, or diarrhea.  You have a bad smelling vaginal discharge.  You have pain with urination. SEEK IMMEDIATE MEDICAL CARE IF:   You have a fever.  You are leaking fluid from your vagina.  You have spotting or bleeding from your vagina.  You have severe abdominal cramping or pain.  You have rapid weight loss or gain.  You have shortness of breath with chest pain.  You notice sudden or extreme swelling of your face, hands, ankles, feet, or legs.  You have not felt your baby move in over an hour.  You have severe headaches that do not go away with medicine.  You have vision changes. Document Released: 10/04/2001 Document Revised: 10/15/2013 Document Reviewed: 12/11/2012 [facility] Patient Information 2015 [place], [place]. This information is not intended to replace advice given to you by your health care provider. Make sure you discuss any questions you have with your health care provider.

## 2021-03-30 NOTE — Progress Notes (Signed)
HIGH-RISK PREGNANCY VISIT Patient name: Alicia [name] MRN [id number]  Date of birth: 1991-06-07 Chief Complaint:   Routine Prenatal Visit  History of Present Illness:   Alicia [name] is a 30 y.o. 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    Today she reports forgot a few pills ~3wks ago, and even though she has gotten back on track, itching is intense. Contractions: Irritability. Vag. Bleeding: None.  Movement: Present. denies leaking of fluid.   Depression screen [facility] 2/9 03/09/2021 12/08/2020 01/28/2020 03/07/2018 11/02/2017  Decreased Interest 0 0 0 0 0  Down, Depressed, Hopeless 0 0 0 0 0  PHQ - 2 Score 0 0 0 0 0  Altered sleeping 0 3 0 - -  Tired, decreased energy 0 0 0 - -  Change in appetite 0 2 0 - -  Feeling bad or failure about yourself  0 0 0 - -  Trouble concentrating 0 0 0 - -  Moving slowly or fidgety/restless 0 0 0 - -  Suicidal thoughts 0 0 0 - -  PHQ-9 Score 0 5 0 - -     GAD 7 : Generalized Anxiety Score 03/09/2021 12/08/2020 01/28/2020  Nervous, Anxious, on Edge 0 0 0  Control/stop worrying 0 0 0  Worry too much - different things 0 1 0  Trouble relaxing 0 0 0  Restless 0 0 0  Easily annoyed or irritable 0 0 0  Afraid - awful might happen 0 0 0  Total GAD 7 Score 0 1 0     Review of Systems:   Pertinent items are noted in HPI Denies abnormal vaginal discharge w/ itching/odor/irritation, headaches, visual changes, shortness of breath, chest pain, abdominal pain, severe nausea/vomiting, or problems with urination or bowel movements unless otherwise stated above. Pertinent History Reviewed:  Reviewed past medical,surgical, social, obstetrical and family history.  Reviewed problem list, medications and  allergies. Physical Assessment:   Vitals:   03/30/21 0838  BP: 105/71  Pulse: 92  Weight: 136 lb (61.7 kg)  Body mass index is 24.87 kg/m.           Physical Examination:   General appearance: alert, well appearing, and in no distress  Mental status: alert, oriented to person, place, and time  Skin: warm & dry   Extremities: Edema: None    Cardiovascular: normal heart rate noted  Respiratory: normal respiratory effort, no distress  Abdomen: gravid, soft, non-tender  Pelvic: Cervical exam deferred         Fetal Status: Fetal Heart Rate (bpm): 145 Fundal Height: 29 cm Movement: Present    Fetal Surveillance Testing today: doppler   Chaperone: N/A    No results found for this or any previous visit (from the past 24 hour(s)).  Assessment & Plan:  High-risk pregnancy: G3P1011 at [redacted]w[redacted]d with an Estimated Date of Delivery: 06/02/21   1) Severe ICP dx @ 10wks, having intense itching again (missed few pills few weeks ago), will get fasting bile acids/cmp today (last check 3wks ago BA down to 18.7 from max of 209.6, and LFTs 18/36 down from max of 448/1186). Continue actigall 900mg  TID and questran 4gm BID. Has BPP 6/13 and EFW 6/29 w/ MFM- keep these appts, and will start 2x/wk NSTs here w/ [place]  next Thurs (32wks)- so can cancel all other MFM appts (note routed to [facility]  2) NIPS low FF and increased r/f T18/T13, normal detailed u/s w/ MFM, declined diagnostic testing  Meds: No orders of the defined types were placed in this encounter.   Labs/procedures today: labs  Treatment Plan:  [place] q4wks     Weekly BPP until 32wks then 2x/wk testing     Deliver 37wks or as per MFM:____   Reviewed: Preterm labor symptoms and general obstetric precautions including but not limited to vaginal bleeding, contractions, leaking of fluid and fetal movement were reviewed in detail with the patient.  All questions were answered. Does have home bp cuff. Office bp cuff given: not applicable. Check  bp weekly, let us know if consistently >140 and/or >90.  Follow-up: Return for 6/16 for nst/hrob w/ md/cnm, then tues nst/hrob w/ md/cnm and frid nst/nurse until 7/19.   Future Appointments  Date Time Provider [place]  04/05/2021  9:15 AM [facility] NST [facility]  04/12/2021 11:15 AM [facility] NST [facility]  04/21/2021  9:15 AM WMC-MFC NURSE [facility]  04/21/2021  9:30 AM W[facility]-MFC US3 [facility]MFC[facility]  04/27/2021  3:00 PM Rehman, Mechele Dawley, MD NRE-NRE None  04/28/2021 11:15 AM W[facility]-WOCA NST W[facility]-C[facility] [place]    Orders Placed This Encounter  Procedures  . Comprehensive metabolic panel  . Bile acids, total   Roma Schanz CNM, [facility] 03/30/2021 9:14 AM

## 2021-04-01 ENCOUNTER — Other Ambulatory Visit: Payer: Self-pay | Admitting: Women's Health

## 2021-04-01 DIAGNOSIS — K831 Obstruction of bile duct: Secondary | ICD-10-CM

## 2021-04-01 LAB — COMPREHENSIVE METABOLIC PANEL
ALT: 78 IU/L — ABNORMAL HIGH (ref 0–32)
AST: 37 IU/L (ref 0–40)
Albumin/Globulin Ratio: 1.1 — ABNORMAL LOW (ref 1.2–2.2)
Albumin: 3.8 g/dL — ABNORMAL LOW (ref 3.9–5.0)
Alkaline Phosphatase: 170 IU/L — ABNORMAL HIGH (ref 44–121)
BUN/Creatinine Ratio: 12 (ref 9–23)
BUN: 7 mg/dL (ref 6–20)
Bilirubin Total: 0.4 mg/dL (ref 0.0–1.2)
CO2: 18 mmol/L — ABNORMAL LOW (ref 20–29)
Calcium: 9.7 mg/dL (ref 8.7–10.2)
Chloride: 100 mmol/L (ref 96–106)
Creatinine, Ser: 0.6 mg/dL (ref 0.57–1.00)
Globulin, Total: 3.6 g/dL (ref 1.5–4.5)
Glucose: 86 mg/dL (ref 65–99)
Potassium: 3.7 mmol/L (ref 3.5–5.2)
Sodium: 134 mmol/L (ref 134–144)
Total Protein: 7.4 g/dL (ref 6.0–8.5)
eGFR: 124 mL/min/{1.73_m2} (ref >59)

## 2021-04-01 LAB — BILE ACIDS, TOTAL: Bile Acids Total: 93.7 umol/L — ABNORMAL HIGH (ref 0.0–10.0)

## 2021-04-01 MED ORDER — RIFAMPIN 300 MG PO CAPS: 300.0000 mg | ORAL_CAPSULE | Freq: Two times a day (BID) | ORAL | 3 refills | Status: DC

## 2021-04-05 ENCOUNTER — Ambulatory Visit (INDEPENDENT_AMBULATORY_CARE_PROVIDER_SITE_OTHER): Payer: Medicaid Other

## 2021-04-05 ENCOUNTER — Other Ambulatory Visit: Payer: Self-pay

## 2021-04-05 ENCOUNTER — Ambulatory Visit: Payer: Medicaid Other | Admitting: *Deleted

## 2021-04-05 VITALS — BP 112/72 | HR 88 | Wt 136.8 lb

## 2021-04-05 DIAGNOSIS — K831 Obstruction of bile duct: Secondary | ICD-10-CM | POA: Diagnosis not present

## 2021-04-05 DIAGNOSIS — O26613 Liver and biliary tract disorders in pregnancy, third trimester: Secondary | ICD-10-CM | POA: Diagnosis not present

## 2021-04-05 NOTE — Progress Notes (Signed)

## 2021-04-08 ENCOUNTER — Encounter: Payer: Self-pay | Admitting: Family Medicine

## 2021-04-08 ENCOUNTER — Other Ambulatory Visit: Payer: Self-pay

## 2021-04-08 ENCOUNTER — Ambulatory Visit (INDEPENDENT_AMBULATORY_CARE_PROVIDER_SITE_OTHER): Payer: Medicaid Other | Admitting: Family Medicine

## 2021-04-08 VITALS — BP 104/69 | HR 87 | Wt 136.0 lb

## 2021-04-08 DIAGNOSIS — O285 Abnormal chromosomal and genetic finding on antenatal screening of mother: Secondary | ICD-10-CM

## 2021-04-08 DIAGNOSIS — B009 Herpesviral infection, unspecified: Secondary | ICD-10-CM

## 2021-04-08 DIAGNOSIS — F1290 Cannabis use, unspecified, uncomplicated: Secondary | ICD-10-CM

## 2021-04-08 DIAGNOSIS — O26619 Liver and biliary tract disorders in pregnancy, unspecified trimester: Secondary | ICD-10-CM

## 2021-04-08 DIAGNOSIS — A599 Trichomoniasis, unspecified: Secondary | ICD-10-CM

## 2021-04-08 DIAGNOSIS — O0993 Supervision of high risk pregnancy, unspecified, third trimester: Secondary | ICD-10-CM

## 2021-04-08 DIAGNOSIS — K831 Obstruction of bile duct: Secondary | ICD-10-CM

## 2021-04-08 MED ORDER — RIFAMPIN 300 MG PO CAPS: 600.0000 mg | ORAL_CAPSULE | Freq: Two times a day (BID) | ORAL | 3 refills | Status: DC

## 2021-04-08 NOTE — Progress Notes (Addendum)
PRENATAL VISIT NOTE  Subjective:  Alicia [name] is a 30 y.o. G3P1011 at 71w1dbeing seen today for ongoing prenatal care.  She is currently monitored for the following issues for this high-risk pregnancy and has Marijuana use; Severe cholestasis during pregnancy, antepartum; Elevated LFTs; HSV-2 infection; Supervision of high-risk pregnancy; Cholelithiases; Trichomonas infection; and Abnormal chromsoml and genetic finding on antenatal screen of mother on their problem list.  Patient reports  pruritis .  Contractions: Irritability. Vag. Bleeding: None.  Movement: Present. Denies leaking of fluid.   The following portions of the patient's history were reviewed and updated as appropriate: allergies, current medications, past family history, past medical history, past social history, past surgical history and problem list.   Objective:   Vitals:   04/08/21 1000  BP: 104/69  Pulse: 87  Weight: 136 lb (61.7 kg)    Fetal Status:     Movement: Present     General:  Alert, oriented and cooperative. Patient is in no acute distress.  Skin: Skin is warm and dry. No rash noted.   Cardiovascular: Normal heart rate noted  Respiratory: Normal respiratory effort, no problems with respiration noted  Abdomen: Soft, gravid, appropriate for gestational age.  Pain/Pressure: Absent     Pelvic: Cervical exam deferred        Extremities: Normal range of motion.  Edema: None  Mental Status: Normal mood and affect. Normal behavior. Normal judgment and thought content.    NST FHT: baseline 130bpm, mod variability, +accels/no decels Toco: intermittent Reactive  Assessment and Plan:  Pregnancy: G3P1011 at 375w1d. Supervision of high risk pregnancy in third trimester -doing well overall, still itching, otherwise no complaints -taking PNV -nexplanon for contraception -consider early GBS/gcc at next appt, given likely early IOL  2. Abnormal chromsoml and genetic finding on antenatal screen of  mother Met with MFM, declined further testing.  3. Trichomonas infection TOC neg  4. Severe cholestasis during pregnancy, antepartum NST/BPP/ultrasounds scheduled at FT/MFM. Currently taking actigal 900 TID, questran 4g BID, rifampin 300 BID. Given continued pruritis, after discussion with Dr. EuElonda Huskyill increase rifampin to 600 BID. Will repeat labs at next appt and consider IOL if symptoms unchanged or lab work worse.  5. Marijuana use   6. 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 Not on meds. Consider prophylaxis at next appt given likely early IOL.  Preterm labor symptoms and general obstetric precautions including but not limited to vaginal bleeding, contractions, leaking of fluid and fetal movement were reviewed in detail with the patient. Please refer to After Visit Summary for other counseling recommendations.   No follow-ups on file.  Future Appointments  Date Time Provider [place]6/20/2022 11:15 AM [facility] NST [facility]6/23/2022  2:30 PM CWH-FTOBGYN NURSE C[facility]-FT [facility]  04/19/2021 11:30 AM EuFlorian BuffMD CWH-FT [facility]TO[facility]  04/21/2021  9:15 AM [facility]FC NURSE [facility]/29/2022  9:30 AM [facility]MFC US3 [facility]7/10/2020 11:30 AM CWH-FTOBGYN NURSE CWH-FT [facility]TO[facility]  04/27/2021 11:10 AM EuFlorian BuffMD CWH-FT [facility]  04/27/2021  3:00 PM ReRogene HoustonMD NRE-NRE None  04/28/2021 11:15 AM [facility]OCA NST [facility]/05/2021 10:50 AM CWH-FTOBGYN NURSE CWH-FT [facility]TO[facility]  05/04/2021 10:50 AM EuFlorian BuffMD CWH-FT [facility]TO[facility]  05/05/2021  1:15 PM [facility]ST [facility]/15/2022 10:50 AM CWH-FTOBGYN NURSE CWH-FT [facility]TO[facility]  05/11/2021 10:10 AM EuFlorian BuffMD CWH-FT FTZetta Bills  AlArrie SenateMD

## 2021-04-12 ENCOUNTER — Ambulatory Visit (INDEPENDENT_AMBULATORY_CARE_PROVIDER_SITE_OTHER): Payer: Medicaid Other

## 2021-04-12 ENCOUNTER — Ambulatory Visit (INDEPENDENT_AMBULATORY_CARE_PROVIDER_SITE_OTHER): Payer: Medicaid Other | Admitting: General Practice

## 2021-04-12 ENCOUNTER — Other Ambulatory Visit: Payer: Self-pay

## 2021-04-12 VITALS — BP 109/79 | HR 88 | Wt 136.9 lb

## 2021-04-12 DIAGNOSIS — O0990 Supervision of high risk pregnancy, unspecified, unspecified trimester: Secondary | ICD-10-CM

## 2021-04-12 DIAGNOSIS — O26619 Liver and biliary tract disorders in pregnancy, unspecified trimester: Secondary | ICD-10-CM

## 2021-04-12 DIAGNOSIS — K831 Obstruction of bile duct: Secondary | ICD-10-CM | POA: Diagnosis not present

## 2021-04-12 DIAGNOSIS — Z3A32 32 weeks gestation of pregnancy: Secondary | ICD-10-CM

## 2021-04-12 NOTE — Progress Notes (Signed)
Pt informed that the ultrasound is considered a limited OB ultrasound and is not intended to be a complete ultrasound exam.  Patient also informed that the ultrasound is not being completed with the intent of assessing for fetal or placental anomalies or any pelvic abnormalities.  Explained that the purpose of today's ultrasound is to assess for  BPP, presentation, and AFI.  Patient acknowledges the purpose of the exam and the limitations of the study.     Koren Bound RN BSN 04/12/21

## 2021-04-13 ENCOUNTER — Other Ambulatory Visit: Payer: Medicaid Other | Admitting: Obstetrics & Gynecology

## 2021-04-15 ENCOUNTER — Other Ambulatory Visit: Payer: Self-pay

## 2021-04-15 ENCOUNTER — Ambulatory Visit (INDEPENDENT_AMBULATORY_CARE_PROVIDER_SITE_OTHER): Payer: Medicaid Other | Admitting: *Deleted

## 2021-04-15 VITALS — BP 104/71 | HR 93 | Wt 137.0 lb

## 2021-04-15 DIAGNOSIS — O0993 Supervision of high risk pregnancy, unspecified, third trimester: Secondary | ICD-10-CM

## 2021-04-15 NOTE — Progress Notes (Signed)
   NURSE VISIT- NST  SUBJECTIVE:  Alicia [name] is a 30 y.o. G30P1011 female at [redacted]w[redacted]d, here for a NST for pregnancy complicated by Intrahepatic cholestasis of pregnancy.  She reports active fetal movement, contractions: none, vaginal bleeding: none, membranes: intact.   OBJECTIVE:  BP 104/71   Pulse 93   Wt 137 lb (62.1 kg)   LMP 08/26/2020 (Exact Date)   BMI 25.06 kg/m   Appears well, no apparent distress  No results found for this or any previous visit (from the past 24 hour(s)).  NST: FHR baseline 145 bpm, Variability: moderate, Accelerations:present, Decelerations:  Absent= Cat 1/reactive Toco: none   ASSESSMENT: G3P1011 at [redacted]w[redacted]d with Intrahepatic cholestasis of pregnancy NST reactive  PLAN: EFM strip reviewed by Dr. Nelda Marseille   Recommendations: keep next appointment as scheduled    Janece Canterbury  04/15/2021 4:30 PM

## 2021-04-16 ENCOUNTER — Other Ambulatory Visit: Payer: Medicaid Other

## 2021-04-19 ENCOUNTER — Other Ambulatory Visit: Payer: Self-pay

## 2021-04-19 ENCOUNTER — Encounter: Payer: Self-pay | Admitting: Obstetrics & Gynecology

## 2021-04-19 ENCOUNTER — Ambulatory Visit (INDEPENDENT_AMBULATORY_CARE_PROVIDER_SITE_OTHER): Payer: Medicaid Other | Admitting: Obstetrics & Gynecology

## 2021-04-19 ENCOUNTER — Other Ambulatory Visit: Payer: Self-pay | Admitting: Women's Health

## 2021-04-19 ENCOUNTER — Other Ambulatory Visit: Payer: Medicaid Other

## 2021-04-19 VITALS — BP 110/69 | HR 95 | Wt 134.0 lb

## 2021-04-19 DIAGNOSIS — O0993 Supervision of high risk pregnancy, unspecified, third trimester: Secondary | ICD-10-CM

## 2021-04-19 DIAGNOSIS — O0990 Supervision of high risk pregnancy, unspecified, unspecified trimester: Secondary | ICD-10-CM

## 2021-04-19 DIAGNOSIS — O26619 Liver and biliary tract disorders in pregnancy, unspecified trimester: Secondary | ICD-10-CM

## 2021-04-19 DIAGNOSIS — Z3A33 33 weeks gestation of pregnancy: Secondary | ICD-10-CM

## 2021-04-19 DIAGNOSIS — O26649 Intrahepatic cholestasis of pregnancy, unspecified trimester: Secondary | ICD-10-CM

## 2021-04-19 DIAGNOSIS — K831 Obstruction of bile duct: Secondary | ICD-10-CM

## 2021-04-19 NOTE — Progress Notes (Signed)
HIGH-RISK PREGNANCY VISIT Patient name: Alicia [name] MRN [id number]  Date of birth: August 10, 1991 Chief Complaint:   High Risk Gestation (NST today)  History of Present Illness:   Alicia [name] is a 30 y.o. G69P1011 female at [redacted]w[redacted]d with an Estimated Date of Delivery: 06/02/21 being seen today for ongoing management of a high-risk pregnancy complicated by severe ICP in pregnancy.    Today she reports  itching and no complaints. Contractions: Irregular. Vag. Bleeding: None.  Movement: Present. denies leaking of fluid.   Depression screen [facility] 2/9 04/12/2021 03/09/2021 12/08/2020 01/28/2020 03/07/2018  Decreased Interest 0 0 0 0 0  Down, Depressed, Hopeless 0 0 0 0 0  PHQ - 2 Score 0 0 0 0 0  Altered sleeping 0 0 3 0 -  Tired, decreased energy 0 0 0 0 -  Change in appetite 0 0 2 0 -  Feeling bad or failure about yourself  0 0 0 0 -  Trouble concentrating 0 0 0 0 -  Moving slowly or fidgety/restless 0 0 0 0 -  Suicidal thoughts 0 0 0 0 -  PHQ-9 Score 0 0 5 0 -     GAD 7 : Generalized Anxiety Score 04/12/2021 03/09/2021 12/08/2020 01/28/2020  Nervous, Anxious, on Edge 0 0 0 0  Control/stop worrying 0 0 0 0  Worry too much - different things 0 0 1 0  Trouble relaxing 0 0 0 0  Restless 0 0 0 0  Easily annoyed or irritable 0 0 0 0  Afraid - awful might happen 0 0 0 0  Total GAD 7 Score 0 0 1 0     Review of Systems:   Pertinent items are noted in HPI Denies abnormal vaginal discharge w/ itching/odor/irritation, headaches, visual changes, shortness of breath, chest pain, abdominal pain, severe nausea/vomiting, or problems with urination or bowel movements unless otherwise stated above. Pertinent History Reviewed:  Reviewed past medical,surgical, social, obstetrical and family history.  Reviewed problem list, medications and allergies. Physical Assessment:   Vitals:   04/19/21 1128  BP: 110/69  Pulse: 95  Weight: 134 lb (60.8 kg)  Body mass index is 24.51 kg/m.           Physical  Examination:   General appearance: alert, well appearing, and in no distress  Mental status: alert, oriented to person, place, and time  Skin: warm & dry   Extremities: Edema: None    Cardiovascular: normal heart rate noted  Respiratory: normal respiratory effort, no distress  Abdomen: gravid, soft, non-tender  Pelvic: Cervical exam deferred         Fetal Status:     Movement: Present    Fetal Surveillance Testing today: Reactive NST  [name] S Skiff is at [redacted]w[redacted]d Estimated Date of Delivery: 06/02/21  NST being performed due to ICP, severe  Today the NST is Reactive  Fetal Monitoring:  Baseline: 135 bpm, Variability: Good {> 6 bpm), Accelerations: Reactive, and Decelerations: Absent   reactive  The accelerations are >15 bpm and more than 2 in 20 minutes  Final diagnosis:  Reactive NST  Florian Buff, MD     Chaperone: N/A    No results found for this or any previous visit (from the past 24 hour(s)).  Assessment & Plan:  High-risk pregnancy: G3P1011 at [redacted]w[redacted]d with an Estimated Date of Delivery: 06/02/21   1) ICP severe on ursodiol 900 TID questran 4 grams BID and rifampin 600 BID, stable, still itching, surveillance reassuring  Fasting bile acids tomorrow, not fasting this am, and CMP to check LFTs    Meds: No orders of the defined types were placed in this encounter.   Labs/procedures today: NST  Treatment Plan:  twice weekly surveillance sonogram alternating with NST deliver at 37 weeks unless otherwise clinically indicated  Reviewed: Preterm labor symptoms and general obstetric precautions including but not limited to vaginal bleeding, contractions, leaking of fluid and fetal movement were reviewed in detail with the patient.  All questions were answered. Does have home bp cuff. Office bp cuff given: yes. Check bp weekly, let us know if consistently >140 and/or >90.  Follow-up: No follow-ups on file.   Future Appointments  Date Time Provider [place]   04/19/2021  2:15 PM [facility] NST [facility]  04/21/2021  9:15 AM [facility] NURSE [facility]  04/21/2021  9:30 AM [facility] US3 [facility]  04/23/2021 11:30 AM [facility] NURSE [facility]  04/27/2021 11:10 AM Florian Buff, MD [facility]-FT [facility]  04/27/2021  3:00 PM Rogene Houston, MD NRE-NRE None  04/28/2021 11:15 AM [facility]A NST [facility]  04/30/2021 10:50 AM [facility]-FTOBGYN NURSE [facility]-FT [facility]TO[facility]  05/03/2021 11:15 AM [facility] NST [facility]  05/04/2021 10:50 AM Florian Buff, MD CWH-FT [facility]  05/05/2021  1:15 PM [facility] NST [facility]  05/07/2021 10:50 AM [facility]FTOBGYN NURSE C[facility]-FT [facility]  05/10/2021 11:15 AM [facility] NST [facility]  05/11/2021 10:10 AM Florian Buff, MD CWH-FT [facility]TO[facility]    No orders of the defined types were placed in this encounter.  Mertie Clause Armine Rizzolo  04/19/2021 12:12 PM

## 2021-04-20 ENCOUNTER — Other Ambulatory Visit: Payer: Medicaid Other | Admitting: Obstetrics & Gynecology

## 2021-04-21 ENCOUNTER — Ambulatory Visit: Payer: Medicaid Other | Attending: Obstetrics

## 2021-04-21 ENCOUNTER — Ambulatory Visit (HOSPITAL_BASED_OUTPATIENT_CLINIC_OR_DEPARTMENT_OTHER): Payer: Medicaid Other | Admitting: Obstetrics and Gynecology

## 2021-04-21 ENCOUNTER — Encounter: Payer: Self-pay | Admitting: *Deleted

## 2021-04-21 ENCOUNTER — Other Ambulatory Visit: Payer: Self-pay

## 2021-04-21 ENCOUNTER — Ambulatory Visit: Payer: Medicaid Other | Admitting: *Deleted

## 2021-04-21 VITALS — BP 111/72 | HR 92

## 2021-04-21 DIAGNOSIS — O285 Abnormal chromosomal and genetic finding on antenatal screening of mother: Secondary | ICD-10-CM

## 2021-04-21 DIAGNOSIS — B009 Herpesviral infection, unspecified: Secondary | ICD-10-CM

## 2021-04-21 DIAGNOSIS — Z3A34 34 weeks gestation of pregnancy: Secondary | ICD-10-CM | POA: Diagnosis not present

## 2021-04-21 DIAGNOSIS — R7989 Other specified abnormal findings of blood chemistry: Secondary | ICD-10-CM

## 2021-04-21 DIAGNOSIS — O26643 Intrahepatic cholestasis of pregnancy, third trimester: Secondary | ICD-10-CM

## 2021-04-21 DIAGNOSIS — O98513 Other viral diseases complicating pregnancy, third trimester: Secondary | ICD-10-CM | POA: Diagnosis not present

## 2021-04-21 DIAGNOSIS — O26619 Liver and biliary tract disorders in pregnancy, unspecified trimester: Secondary | ICD-10-CM | POA: Insufficient documentation

## 2021-04-21 DIAGNOSIS — O26613 Liver and biliary tract disorders in pregnancy, third trimester: Secondary | ICD-10-CM

## 2021-04-21 DIAGNOSIS — O283 Abnormal ultrasonic finding on antenatal screening of mother: Secondary | ICD-10-CM | POA: Diagnosis not present

## 2021-04-21 DIAGNOSIS — K831 Obstruction of bile duct: Secondary | ICD-10-CM | POA: Diagnosis present

## 2021-04-21 NOTE — Progress Notes (Signed)
Maternal-Fetal Medicine   Name: Alicia [name] DOB: 04-11-1991 MRN: [id number] Referring Provider: Tania Ade, MD  I had the pleasure of seeing Ms. Fjelstad today at the [facility]. She is G3 P1 at 38 weeks' gestation with cholestasis in pregnancy.  Patient had cholecystectomy in February 2022 and had increasing liver enzymes.  Most recent AST is 37 and ALT is 78.  Total bilirubin is within normal range.  4 months ago, she had very high bile acids (173 mol/L).  Most recent bile acid estimation performed 3 weeks ago was 93.7 mol/L). Patient still has some itching.  Her symptoms have improved with 3 medications.  She takes rifampin, cholestyramine and ursodiol.  Patient reports good fetal movements.  Her blood pressure today at her office is 111/72 mmHg.  Cephalic presentation.  Antenatal testing is reassuring.  BPP 8/8.  Obstetrical history significant for a term vaginal delivery in 2015 of a female infant weighing 5 pounds 1 ounce at birth.  Her son is in good health.  On today's ultrasound, fetal growth is appropriate for gestational age.  Amniotic fluid is normal good fetal activity seen.  Our concerns include Intrahepatic cholestasis of pregnancy -Incidence is between 0.3 to 0.5%. -Stillbirth is the most significant adverse outcome that occurs in about 1.2% of these pregnancies (compared with 1-2 out of 1,000 normal pregnancies).   -Ursodeoxycholic acid is safe in pregnancy.  About 25% of women experience nausea and vomiting.  Complete relief from pruritus is not seen in some women. -Discussed timing of delivery.  If bile acid levels are less than 40 mol/L, risk of stillbirth remains low.  If, however bile acid levels are greater than 100 mol/L, risk of stillbirth is significantly increased. -We recommend weekly antenatal testing.  I informed her that antenatal testing does not always predict fetal compromise in pregnancy is complicated with intrahepatic  cholestasis. -It is reasonable to deliver at [redacted] weeks gestation in pregnant women with cholestasis.  Society of maternal-fetal medicine recommends delivery to be considered between 54- and 39-weeks' gestation. -Delivery is usually considered at [redacted] weeks gestation if bile acid levels are significantly increased (100 mol/L) or if maternal itching symptom is very severe. -Recurrence rate is up to 90% and subsequent pregnancies. -Women with intrahepatic cholestasis in pregnancy are more prone to develop hepatobiliary disease later in their lives.  I counseled the patient on the diagnosis, antenatal testing, and timing of delivery.   Recommendations -Weekly BPP at our office. -Delivery at [redacted] weeks gestation. -Delivery at [redacted] weeks gestation should be considered if 1) bile acid levels are above 100 mol/L or 2) maternal symptoms are severe and uncontrollable. -Repeat bile acids next week.  Thank you for consultation. If you have any questions, please contact me at the [facility]. Consultation including face-to-face counseling 30 minutes.

## 2021-04-23 ENCOUNTER — Ambulatory Visit (INDEPENDENT_AMBULATORY_CARE_PROVIDER_SITE_OTHER): Payer: Medicaid Other | Admitting: *Deleted

## 2021-04-23 ENCOUNTER — Ambulatory Visit: Payer: Medicaid Other

## 2021-04-23 ENCOUNTER — Other Ambulatory Visit: Payer: Self-pay

## 2021-04-23 VITALS — BP 100/68 | HR 82 | Wt 136.0 lb

## 2021-04-23 DIAGNOSIS — K831 Obstruction of bile duct: Secondary | ICD-10-CM

## 2021-04-23 DIAGNOSIS — O26619 Liver and biliary tract disorders in pregnancy, unspecified trimester: Secondary | ICD-10-CM

## 2021-04-23 DIAGNOSIS — Z3A34 34 weeks gestation of pregnancy: Secondary | ICD-10-CM

## 2021-04-23 DIAGNOSIS — O0993 Supervision of high risk pregnancy, unspecified, third trimester: Secondary | ICD-10-CM

## 2021-04-23 NOTE — Progress Notes (Addendum)
   NURSE VISIT- NST  SUBJECTIVE:  Alicia [name] is a 30 y.o. G43P1011 female at [redacted]w[redacted]d, here for a NST for pregnancy complicated by Intrahepatic cholestasis of pregnancy.  She reports active fetal movement, contractions: none, vaginal bleeding: none, membranes: intact.   OBJECTIVE:  BP 100/68   Pulse 82   Wt 136 lb (61.7 kg)   LMP 08/26/2020 (Exact Date)   BMI 24.87 kg/m   Appears well, no apparent distress  No results found for this or any previous visit (from the past 24 hour(s)).  NST: FHR baseline 130 bpm, Variability: moderate, Accelerations:present, Decelerations:  Absent= Cat 1/reactive Toco: none   ASSESSMENT: G3P1011 at [redacted]w[redacted]d with Intrahepatic cholestasis of pregnancy NST reactive  PLAN: EFM strip reviewed by Dr. Elonda Husky   Recommendations: keep next appointment as scheduled, CMP and Bile acids ordered per last OV note.     Alicia [name]  04/23/2021 12:33 PM  Attestation of Attending Supervision of Nursing Visit Encounter: Evaluation and management procedures were performed by the nursing staff under my supervision and collaboration.  I have reviewed the nurse's note and chart, and I agree with the management and plan.  Jacelyn Grip MD Attending Physician for the Center for [facility] 04/26/2021 11:18 AM

## 2021-04-25 LAB — COMPREHENSIVE METABOLIC PANEL
ALT: 30 IU/L (ref 0–32)
AST: 22 IU/L (ref 0–40)
Albumin/Globulin Ratio: 1.2 (ref 1.2–2.2)
Albumin: 3.8 g/dL — ABNORMAL LOW (ref 3.9–5.0)
Alkaline Phosphatase: 194 IU/L — ABNORMAL HIGH (ref 44–121)
BUN/Creatinine Ratio: 8 — ABNORMAL LOW (ref 9–23)
BUN: 5 mg/dL — ABNORMAL LOW (ref 6–20)
Bilirubin Total: 0.4 mg/dL (ref 0.0–1.2)
CO2: 19 mmol/L — ABNORMAL LOW (ref 20–29)
Calcium: 9.6 mg/dL (ref 8.7–10.2)
Chloride: 101 mmol/L (ref 96–106)
Creatinine, Ser: 0.61 mg/dL (ref 0.57–1.00)
Globulin, Total: 3.1 g/dL (ref 1.5–4.5)
Glucose: 70 mg/dL (ref 65–99)
Potassium: 3.9 mmol/L (ref 3.5–5.2)
Sodium: 139 mmol/L (ref 134–144)
Total Protein: 6.9 g/dL (ref 6.0–8.5)
eGFR: 123 mL/min/{1.73_m2} (ref >59)

## 2021-04-25 LAB — BILE ACIDS, TOTAL: Bile Acids Total: 90.3 umol/L — ABNORMAL HIGH (ref 0.0–10.0)

## 2021-04-27 ENCOUNTER — Other Ambulatory Visit: Payer: Medicaid Other | Admitting: Obstetrics & Gynecology

## 2021-04-27 ENCOUNTER — Ambulatory Visit (INDEPENDENT_AMBULATORY_CARE_PROVIDER_SITE_OTHER): Payer: Medicaid Other | Admitting: Internal Medicine

## 2021-04-27 ENCOUNTER — Other Ambulatory Visit: Payer: Self-pay | Admitting: Obstetrics & Gynecology

## 2021-04-27 MED ORDER — HYDROXYZINE PAMOATE 25 MG PO CAPS: 25.0000 mg | ORAL_CAPSULE | Freq: Three times a day (TID) | ORAL | 0 refills | Status: DC | PRN

## 2021-04-28 ENCOUNTER — Ambulatory Visit (INDEPENDENT_AMBULATORY_CARE_PROVIDER_SITE_OTHER): Payer: Medicaid Other

## 2021-04-28 ENCOUNTER — Other Ambulatory Visit: Payer: Self-pay

## 2021-04-28 ENCOUNTER — Ambulatory Visit: Payer: Medicaid Other | Admitting: *Deleted

## 2021-04-28 VITALS — BP 112/72 | HR 88

## 2021-04-28 DIAGNOSIS — O26619 Liver and biliary tract disorders in pregnancy, unspecified trimester: Secondary | ICD-10-CM

## 2021-04-28 DIAGNOSIS — K831 Obstruction of bile duct: Secondary | ICD-10-CM | POA: Diagnosis not present

## 2021-04-28 NOTE — Progress Notes (Signed)

## 2021-04-30 ENCOUNTER — Ambulatory Visit (INDEPENDENT_AMBULATORY_CARE_PROVIDER_SITE_OTHER): Payer: Medicaid Other | Admitting: Obstetrics & Gynecology

## 2021-04-30 ENCOUNTER — Encounter: Payer: Self-pay | Admitting: Obstetrics & Gynecology

## 2021-04-30 ENCOUNTER — Other Ambulatory Visit: Payer: Self-pay

## 2021-04-30 ENCOUNTER — Other Ambulatory Visit: Payer: Medicaid Other

## 2021-04-30 VITALS — BP 109/69 | HR 102 | Wt 137.0 lb

## 2021-04-30 DIAGNOSIS — O26619 Liver and biliary tract disorders in pregnancy, unspecified trimester: Secondary | ICD-10-CM | POA: Diagnosis not present

## 2021-04-30 DIAGNOSIS — O0993 Supervision of high risk pregnancy, unspecified, third trimester: Secondary | ICD-10-CM

## 2021-04-30 DIAGNOSIS — K831 Obstruction of bile duct: Secondary | ICD-10-CM

## 2021-04-30 DIAGNOSIS — Z1389 Encounter for screening for other disorder: Secondary | ICD-10-CM

## 2021-04-30 NOTE — Progress Notes (Signed)
HIGH-RISK PREGNANCY VISIT Patient name: Alicia [name] MRN [id number]  Date of birth: 01/14/1991 Chief Complaint:   Routine Prenatal Visit, High Risk Gestation, and Non-stress Test  History of Present Illness:   Alicia [name] is a 30 y.o. G61P1011 female at [redacted]w[redacted]d with an Estimated Date of Delivery: 06/02/21 being seen today for ongoing management of a high-risk pregnancy complicated by ICP, severe.    Today she reports  itching and no complaints. Contractions: Irritability. Vag. Bleeding: None.  Movement: Present. denies leaking of fluid.   Depression screen [facility] 2/9 04/12/2021 03/09/2021 12/08/2020 01/28/2020 03/07/2018  Decreased Interest 0 0 0 0 0  Down, Depressed, Hopeless 0 0 0 0 0  PHQ - 2 Score 0 0 0 0 0  Altered sleeping 0 0 3 0 -  Tired, decreased energy 0 0 0 0 -  Change in appetite 0 0 2 0 -  Feeling bad or failure about yourself  0 0 0 0 -  Trouble concentrating 0 0 0 0 -  Moving slowly or fidgety/restless 0 0 0 0 -  Suicidal thoughts 0 0 0 0 -  PHQ-9 Score 0 0 5 0 -     GAD 7 : Generalized Anxiety Score 04/12/2021 03/09/2021 12/08/2020 01/28/2020  Nervous, Anxious, on Edge 0 0 0 0  Control/stop worrying 0 0 0 0  Worry too much - different things 0 0 1 0  Trouble relaxing 0 0 0 0  Restless 0 0 0 0  Easily annoyed or irritable 0 0 0 0  Afraid - awful might happen 0 0 0 0  Total GAD 7 Score 0 0 1 0     Review of Systems:   Pertinent items are noted in HPI Denies abnormal vaginal discharge w/ itching/odor/irritation, headaches, visual changes, shortness of breath, chest pain, abdominal pain, severe nausea/vomiting, or problems with urination or bowel movements unless otherwise stated above. Pertinent History Reviewed:  Reviewed past medical,surgical, social, obstetrical and family history.  Reviewed problem list, medications and allergies. Physical Assessment:   Vitals:   04/30/21 1041  BP: 109/69  Pulse: (!) 102  Weight: 137 lb (62.1 kg)  Body mass index is 25.06  kg/m.           Physical Examination:   General appearance: alert, well appearing, and in no distress  Mental status: alert, oriented to person, place, and time  Skin: warm & dry   Extremities: Edema: None    Cardiovascular: normal heart rate noted  Respiratory: normal respiratory effort, no distress  Abdomen: gravid, soft, non-tender  Pelvic: Cervical exam deferred         Fetal Status:     Movement: Present    Fetal Surveillance Testing today: reactive NST  [name] S Hoeger is at [redacted]w[redacted]d Estimated Date of Delivery: 06/02/21  NST being performed due to ICP, severe  Today the NST is Reactive  Fetal Monitoring:  Baseline: 140 bpm, Variability: Good {> 6 bpm), Accelerations: Reactive, and Decelerations: Absent   reactive  The accelerations are >15 bpm and more than 2 in 20 minutes  Final diagnosis:  Reactive NST  Florian Buff, MD     Chaperone: N/A    No results found for this or any previous visit (from the past 24 hour(s)).  Assessment & Plan:  High-risk pregnancy: G3P1011 at [redacted]w[redacted]d with an Estimated Date of Delivery: 06/02/21   1) ICP, severe, twice weekly testing, IOL 05/12/21, scheduled,     Meds: No orders of the  defined types were placed in this encounter.   Labs/procedures today: NST  Treatment Plan:  twice weekly testing IOL 37 weeks  Reviewed: Preterm labor symptoms and general obstetric precautions including but not limited to vaginal bleeding, contractions, leaking of fluid and fetal movement were reviewed in detail with the patient.  All questions were answered. Does have home bp cuff. Office bp cuff given: not applicable. Check bp weekly, let us know if consistently >140 and/or >90.  Follow-up: No follow-ups on file.   Future Appointments  Date Time Provider [place]  05/03/2021 11:15 AM [facility] NST [facility]  05/06/2021  9:50 AM Florian Buff, MD C[facility]-FT [facility]  05/10/2021 11:15 AM [facility] NST [facility]  05/12/2021  8:15 AM MC-LD SCHED  ROOM MC-INDC None    Orders Placed This Encounter  Procedures   POC Urinalysis Dipstick OB   Mertie Clause Annamary Buschman 04/30/2021 11:40 AM

## 2021-05-02 ENCOUNTER — Other Ambulatory Visit: Payer: Self-pay | Admitting: Advanced Practice Midwife

## 2021-05-03 ENCOUNTER — Ambulatory Visit (INDEPENDENT_AMBULATORY_CARE_PROVIDER_SITE_OTHER): Payer: Medicaid Other

## 2021-05-03 ENCOUNTER — Other Ambulatory Visit: Payer: Self-pay

## 2021-05-03 ENCOUNTER — Ambulatory Visit: Payer: Medicaid Other | Admitting: *Deleted

## 2021-05-03 VITALS — BP 117/76 | HR 79 | Wt 137.0 lb

## 2021-05-03 DIAGNOSIS — K831 Obstruction of bile duct: Secondary | ICD-10-CM

## 2021-05-03 DIAGNOSIS — O26619 Liver and biliary tract disorders in pregnancy, unspecified trimester: Secondary | ICD-10-CM | POA: Diagnosis not present

## 2021-05-03 NOTE — Progress Notes (Signed)

## 2021-05-04 ENCOUNTER — Other Ambulatory Visit: Payer: Medicaid Other | Admitting: Obstetrics & Gynecology

## 2021-05-05 ENCOUNTER — Other Ambulatory Visit: Payer: Medicaid Other

## 2021-05-05 ENCOUNTER — Telehealth (HOSPITAL_COMMUNITY): Payer: Self-pay | Admitting: *Deleted

## 2021-05-05 NOTE — Telephone Encounter (Signed)
Preadmission screen  

## 2021-05-06 ENCOUNTER — Other Ambulatory Visit: Payer: Medicaid Other

## 2021-05-06 ENCOUNTER — Ambulatory Visit (INDEPENDENT_AMBULATORY_CARE_PROVIDER_SITE_OTHER): Payer: Medicaid Other | Admitting: Obstetrics & Gynecology

## 2021-05-06 ENCOUNTER — Encounter (HOSPITAL_COMMUNITY): Payer: Self-pay | Admitting: *Deleted

## 2021-05-06 ENCOUNTER — Encounter: Payer: Medicaid Other | Admitting: Obstetrics & Gynecology

## 2021-05-06 ENCOUNTER — Other Ambulatory Visit: Payer: Self-pay

## 2021-05-06 ENCOUNTER — Encounter: Payer: Self-pay | Admitting: Obstetrics & Gynecology

## 2021-05-06 ENCOUNTER — Other Ambulatory Visit (HOSPITAL_COMMUNITY)
Admission: RE | Admit: 2021-05-06 | Discharge: 2021-05-06 | Disposition: A | Discharge status: Home / Self Care | Payer: Medicaid Other | Source: Ambulatory Visit | Attending: Obstetrics & Gynecology | Admitting: Obstetrics & Gynecology

## 2021-05-06 ENCOUNTER — Telehealth (HOSPITAL_COMMUNITY): Payer: Self-pay | Admitting: *Deleted

## 2021-05-06 VITALS — BP 110/73 | HR 91 | Wt 137.0 lb

## 2021-05-06 DIAGNOSIS — O26619 Liver and biliary tract disorders in pregnancy, unspecified trimester: Secondary | ICD-10-CM

## 2021-05-06 DIAGNOSIS — O0993 Supervision of high risk pregnancy, unspecified, third trimester: Secondary | ICD-10-CM | POA: Diagnosis not present

## 2021-05-06 DIAGNOSIS — Z3A36 36 weeks gestation of pregnancy: Secondary | ICD-10-CM | POA: Insufficient documentation

## 2021-05-06 DIAGNOSIS — K831 Obstruction of bile duct: Secondary | ICD-10-CM

## 2021-05-06 LAB — POCT URINALYSIS DIPSTICK OB
Glucose, UA: NEGATIVE
Ketones, UA: NEGATIVE
Nitrite, UA: NEGATIVE

## 2021-05-06 NOTE — Progress Notes (Signed)
HIGH-RISK PREGNANCY VISIT Patient name: Alicia [name] MRN [id number]  Date of birth: 1991-07-10 Chief Complaint:   High Risk Gestation (NST, GBS, GC/CHL)  History of Present Illness:   Alicia [name] is a 30 y.o. G8P1011 female at [redacted]w[redacted]d with an Estimated Date of Delivery: 06/02/21 being seen today for ongoing management of a high-risk pregnancy complicated by cholestasis of pregnancy.    Today she reports  itching . Contractions: Not present. Vag. Bleeding: None.  Movement: Present. denies leaking of fluid.   Depression screen [facility] 2/9 04/12/2021 03/09/2021 12/08/2020 01/28/2020 03/07/2018  Decreased Interest 0 0 0 0 0  Down, Depressed, Hopeless 0 0 0 0 0  PHQ - 2 Score 0 0 0 0 0  Altered sleeping 0 0 3 0 -  Tired, decreased energy 0 0 0 0 -  Change in appetite 0 0 2 0 -  Feeling bad or failure about yourself  0 0 0 0 -  Trouble concentrating 0 0 0 0 -  Moving slowly or fidgety/restless 0 0 0 0 -  Suicidal thoughts 0 0 0 0 -  PHQ-9 Score 0 0 5 0 -  Some recent data might be hidden     GAD 7 : Generalized Anxiety Score 04/12/2021 03/09/2021 12/08/2020 01/28/2020  Nervous, Anxious, on Edge 0 0 0 0  Control/stop worrying 0 0 0 0  Worry too much - different things 0 0 1 0  Trouble relaxing 0 0 0 0  Restless 0 0 0 0  Easily annoyed or irritable 0 0 0 0  Afraid - awful might happen 0 0 0 0  Total GAD 7 Score 0 0 1 0     Review of Systems:   Pertinent items are noted in HPI Denies abnormal vaginal discharge w/ itching/odor/irritation, headaches, visual changes, shortness of breath, chest pain, abdominal pain, severe nausea/vomiting, or problems with urination or bowel movements unless otherwise stated above. Pertinent History Reviewed:  Reviewed past medical,surgical, social, obstetrical and family history.  Reviewed problem list, medications and allergies. Physical Assessment:   Vitals:   05/06/21 0951  BP: 110/73  Pulse: 91  Weight: 137 lb (62.1 kg)  Body mass index is 25.06  kg/m.           Physical Examination:   General appearance: alert, well appearing, and in no distress  Mental status: alert, oriented to person, place, and time  Skin: warm & dry   Extremities: Edema: None    Cardiovascular: normal heart rate noted  Respiratory: normal respiratory effort, no distress  Abdomen: gravid, soft, non-tender  Pelvic: Cervical exam deferred         Fetal Status:     Movement: Present    Fetal Surveillance Testing today: Reactive NST  Alicia [name] is at [redacted]w[redacted]d Estimated Date of Delivery: 06/02/21  NST being performed due to ICP  Today the NST is Reactive  Fetal Monitoring:  Baseline: 130s bpm, Variability: Good {> 6 bpm), Accelerations: Reactive, and Decelerations: Absent   reactive  The accelerations are >15 bpm and more than 2 in 20 minutes  Final diagnosis:  Reactive NST  Alicia Buff, MD     Chaperone: Alicia [name]    Results for orders placed or performed in visit on 05/06/21 (from the past 24 hour(s))  POC Urinalysis Dipstick OB   Collection Time: 05/06/21  9:53 AM  Result Value Ref Range   Color, UA     Clarity, UA     Glucose,  UA Negative Negative   Bilirubin, UA     Ketones, UA neg    Spec Grav, UA     Blood, UA trace    pH, UA     POC,PROTEIN,UA Trace Negative, Trace, Small (1+), Moderate (2+), Large (3+), 4+   Urobilinogen, UA     Nitrite, UA neg    Leukocytes, UA Trace (A) Negative   Appearance     Odor      Assessment & Plan:  High-risk pregnancy: G3P1011 at [redacted]w[redacted]d with an Estimated Date of Delivery: 06/02/21   1) ICP on actigall, rifampin, questran vistrail reassuring fetal surveillance,     Meds: No orders of the defined types were placed in this encounter.   Labs/procedures today: GBS, GC/CT, and NST  Treatment Plan:  IOL next week BPP 4 days  Reviewed: Preterm labor symptoms and general obstetric precautions including but not limited to vaginal bleeding, contractions, leaking of fluid and fetal movement were  reviewed in detail with the patient.  All questions were answered. Does have home bp cuff. Office bp cuff given: not applicable. Check bp daily, let us know if consistently >140 and/or >90.  Follow-up: No follow-ups on file.   Future Appointments  Date Time Provider [place]  05/10/2021 10:00 AM MC-SCREENING [facility]-SDSC None  05/10/2021 11:15 AM [facility] NST [facility]  05/12/2021  8:15 AM MC-LD SCHED ROOM MC-INDC None    Orders Placed This Encounter  Procedures   Strep Gp B NAA+Rflx   POC Urinalysis Dipstick OB   Alicia [name]  05/06/2021 10:44 AM

## 2021-05-06 NOTE — Telephone Encounter (Signed)
Preadmission screen  

## 2021-05-07 ENCOUNTER — Other Ambulatory Visit: Payer: Medicaid Other

## 2021-05-07 LAB — CERVICOVAGINAL ANCILLARY ONLY
Chlamydia: NEGATIVE
Comment: NEGATIVE
Comment: NORMAL
Neisseria Gonorrhea: NEGATIVE

## 2021-05-10 ENCOUNTER — Other Ambulatory Visit (HOSPITAL_COMMUNITY): Payer: Medicaid Other

## 2021-05-10 ENCOUNTER — Other Ambulatory Visit: Payer: Self-pay

## 2021-05-10 ENCOUNTER — Ambulatory Visit: Payer: Medicaid Other | Admitting: *Deleted

## 2021-05-10 ENCOUNTER — Ambulatory Visit (INDEPENDENT_AMBULATORY_CARE_PROVIDER_SITE_OTHER): Payer: Medicaid Other

## 2021-05-10 VITALS — BP 118/74 | HR 92

## 2021-05-10 DIAGNOSIS — O26619 Liver and biliary tract disorders in pregnancy, unspecified trimester: Secondary | ICD-10-CM

## 2021-05-10 DIAGNOSIS — K831 Obstruction of bile duct: Secondary | ICD-10-CM

## 2021-05-10 LAB — STREP GP B SUSCEPTIBILITY

## 2021-05-10 LAB — STREP GP B NAA+RFLX: Strep Gp B NAA+Rflx: POSITIVE — AB

## 2021-05-10 NOTE — Progress Notes (Signed)
Pt informed that the ultrasound is considered a limited OB ultrasound and is not intended to be a complete ultrasound exam.  Patient also informed that the ultrasound is not being completed with the intent of assessing for fetal or placental anomalies or any pelvic abnormalities.  Explained that the purpose of today's ultrasound is to assess for presentation, BPP and amniotic fluid volume.  Patient acknowledges the purpose of the exam and the limitations of the study.    IOL scheduled on 7/20

## 2021-05-11 ENCOUNTER — Other Ambulatory Visit: Payer: Medicaid Other | Admitting: Obstetrics & Gynecology

## 2021-05-12 ENCOUNTER — Other Ambulatory Visit: Payer: Self-pay

## 2021-05-12 ENCOUNTER — Encounter (HOSPITAL_COMMUNITY): Payer: Self-pay | Admitting: Family Medicine

## 2021-05-12 ENCOUNTER — Inpatient Hospital Stay (HOSPITAL_COMMUNITY): Payer: Medicaid Other

## 2021-05-12 ENCOUNTER — Inpatient Hospital Stay (HOSPITAL_COMMUNITY)
Admission: AD | Admit: 2021-05-12 | Discharge: 2021-05-15 | DRG: 805 | Disposition: A | Discharge status: Home / Self Care | Payer: Medicaid Other | Attending: Obstetrics and Gynecology | Admitting: Obstetrics and Gynecology

## 2021-05-12 DIAGNOSIS — Z880 Allergy status to penicillin: Secondary | ICD-10-CM

## 2021-05-12 DIAGNOSIS — O99824 Streptococcus B carrier state complicating childbirth: Secondary | ICD-10-CM | POA: Diagnosis not present

## 2021-05-12 DIAGNOSIS — O285 Abnormal chromosomal and genetic finding on antenatal screening of mother: Secondary | ICD-10-CM | POA: Diagnosis present

## 2021-05-12 DIAGNOSIS — O99324 Drug use complicating childbirth: Secondary | ICD-10-CM | POA: Diagnosis not present

## 2021-05-12 DIAGNOSIS — Z3A37 37 weeks gestation of pregnancy: Secondary | ICD-10-CM | POA: Diagnosis not present

## 2021-05-12 DIAGNOSIS — Z87891 Personal history of nicotine dependence: Secondary | ICD-10-CM | POA: Diagnosis not present

## 2021-05-12 DIAGNOSIS — O2662 Liver and biliary tract disorders in childbirth: Principal | ICD-10-CM | POA: Diagnosis present

## 2021-05-12 DIAGNOSIS — F1290 Cannabis use, unspecified, uncomplicated: Secondary | ICD-10-CM | POA: Diagnosis present

## 2021-05-12 DIAGNOSIS — Z20822 Contact with and (suspected) exposure to covid-19: Secondary | ICD-10-CM | POA: Diagnosis present

## 2021-05-12 DIAGNOSIS — K8020 Calculus of gallbladder without cholecystitis without obstruction: Secondary | ICD-10-CM | POA: Diagnosis present

## 2021-05-12 DIAGNOSIS — K831 Obstruction of bile duct: Secondary | ICD-10-CM | POA: Diagnosis present

## 2021-05-12 DIAGNOSIS — Z8759 Personal history of other complications of pregnancy, childbirth and the puerperium: Secondary | ICD-10-CM | POA: Diagnosis present

## 2021-05-12 DIAGNOSIS — R7989 Other specified abnormal findings of blood chemistry: Secondary | ICD-10-CM | POA: Diagnosis not present

## 2021-05-12 DIAGNOSIS — Z1629 Resistance to other single specified antibiotic: Secondary | ICD-10-CM | POA: Diagnosis present

## 2021-05-12 DIAGNOSIS — Z8719 Personal history of other diseases of the digestive system: Secondary | ICD-10-CM | POA: Diagnosis present

## 2021-05-12 LAB — TYPE AND SCREEN
ABO/RH(D): O POS
Antibody Screen: NEGATIVE

## 2021-05-12 LAB — CBC
HCT: 36.2 % (ref 36.0–46.0)
Hemoglobin: 12.1 g/dL (ref 12.0–15.0)
MCH: 28.1 pg (ref 26.0–34.0)
MCHC: 33.4 g/dL (ref 30.0–36.0)
MCV: 84 fL (ref 80.0–100.0)
Platelets: 294 10*3/uL (ref 150–400)
RBC: 4.31 MIL/uL (ref 3.87–5.11)
RDW: 14.2 % (ref 11.5–15.5)
WBC: 7.5 10*3/uL (ref 4.0–10.5)
nRBC: 0 % (ref 0.0–0.2)

## 2021-05-12 LAB — RESP PANEL BY RT-PCR (FLU A&B, COVID) ARPGX2
Influenza A by PCR: NEGATIVE
Influenza B by PCR: NEGATIVE
SARS Coronavirus 2 by RT PCR: NEGATIVE

## 2021-05-12 MED ORDER — LIDOCAINE HCL (PF) 1 % IJ SOLN: 30.0000 mL | INTRAMUSCULAR | Status: DC | PRN

## 2021-05-12 MED ORDER — PHENYLEPHRINE 40 MCG/ML (10ML) SYRINGE FOR IV PUSH (FOR BLOOD PRESSURE SUPPORT): 80.0000 ug | PREFILLED_SYRINGE | INTRAVENOUS | Status: DC | PRN

## 2021-05-12 MED ORDER — OXYTOCIN-SODIUM CHLORIDE 30-0.9 UT/500ML-% IV SOLN: 2.5000 [IU]/h | INTRAVENOUS | Status: DC

## 2021-05-12 MED ORDER — FENTANYL CITRATE (PF) 100 MCG/2ML IJ SOLN
100.0000 ug | INTRAMUSCULAR | Status: DC | PRN
  Administered 2021-05-12 (×3): 100 ug via INTRAVENOUS
  Filled 2021-05-12 (×3): qty 2

## 2021-05-12 MED ORDER — TERBUTALINE SULFATE 1 MG/ML IJ SOLN: 0.2500 mg | Freq: Once | INTRAMUSCULAR | Status: DC | PRN

## 2021-05-12 MED ORDER — LACTATED RINGERS IV SOLN
500.0000 mL | Freq: Once | INTRAVENOUS | Status: AC
  Administered 2021-05-13: 500 mL via INTRAVENOUS

## 2021-05-12 MED ORDER — OXYTOCIN BOLUS FROM INFUSION
333.0000 mL | Freq: Once | INTRAVENOUS | Status: AC
  Administered 2021-05-13: 333 mL via INTRAVENOUS

## 2021-05-12 MED ORDER — MISOPROSTOL 25 MCG QUARTER TABLET: 25.0000 ug | ORAL_TABLET | ORAL | Status: DC | PRN

## 2021-05-12 MED ORDER — VANCOMYCIN HCL IN DEXTROSE 1-5 GM/200ML-% IV SOLN
1000.0000 mg | Freq: Two times a day (BID) | INTRAVENOUS | Status: DC
  Administered 2021-05-12 – 2021-05-13 (×2): 1000 mg via INTRAVENOUS
  Filled 2021-05-12 (×2): qty 200

## 2021-05-12 MED ORDER — LACTATED RINGERS IV SOLN: INTRAVENOUS | Status: DC

## 2021-05-12 MED ORDER — LACTATED RINGERS IV SOLN: 500.0000 mL | INTRAVENOUS | Status: DC | PRN

## 2021-05-12 MED ORDER — ACETAMINOPHEN 325 MG PO TABS: 650.0000 mg | ORAL_TABLET | ORAL | Status: DC | PRN

## 2021-05-12 MED ORDER — HYDROXYZINE HCL 25 MG PO TABS: 50.0000 mg | ORAL_TABLET | Freq: Three times a day (TID) | ORAL | Status: DC | PRN

## 2021-05-12 MED ORDER — EPHEDRINE 5 MG/ML INJ: 10.0000 mg | INTRAVENOUS | Status: DC | PRN

## 2021-05-12 MED ORDER — SOD CITRATE-CITRIC ACID 500-334 MG/5ML PO SOLN
30.0000 mL | ORAL | Status: DC | PRN
  Administered 2021-05-12 – 2021-05-13 (×2): 30 mL via ORAL
  Filled 2021-05-12 (×2): qty 30

## 2021-05-12 MED ORDER — OXYCODONE-ACETAMINOPHEN 5-325 MG PO TABS: 1.0000 | ORAL_TABLET | ORAL | Status: DC | PRN

## 2021-05-12 MED ORDER — DIPHENHYDRAMINE HCL 50 MG/ML IJ SOLN
12.5000 mg | INTRAMUSCULAR | Status: DC | PRN
Start: 2021-05-12 — End: 2021-05-13

## 2021-05-12 MED ORDER — MISOPROSTOL 50MCG HALF TABLET
50.0000 ug | ORAL_TABLET | ORAL | Status: DC | PRN
  Administered 2021-05-12: 50 ug via ORAL
  Filled 2021-05-12: qty 1

## 2021-05-12 MED ORDER — ONDANSETRON HCL 4 MG/2ML IJ SOLN
4.0000 mg | Freq: Four times a day (QID) | INTRAMUSCULAR | Status: DC | PRN
  Administered 2021-05-13: 4 mg via INTRAVENOUS
  Filled 2021-05-12: qty 2

## 2021-05-12 MED ORDER — OXYCODONE-ACETAMINOPHEN 5-325 MG PO TABS: 2.0000 | ORAL_TABLET | ORAL | Status: DC | PRN

## 2021-05-12 MED ORDER — FENTANYL-BUPIVACAINE-NACL 0.5-0.125-0.9 MG/250ML-% EP SOLN
12.0000 mL/h | EPIDURAL | Status: DC | PRN
  Administered 2021-05-13: 11 mL/h via EPIDURAL
  Filled 2021-05-12: qty 250

## 2021-05-12 MED ORDER — PHENYLEPHRINE 40 MCG/ML (10ML) SYRINGE FOR IV PUSH (FOR BLOOD PRESSURE SUPPORT)
80.0000 ug | PREFILLED_SYRINGE | INTRAVENOUS | Status: DC | PRN
  Filled 2021-05-12: qty 10

## 2021-05-12 NOTE — Progress Notes (Signed)
Labor Progress Note LOGHAN SUBIA is a 30 y.o. G3P1011 at [redacted]w[redacted]d presented for IOL for ICP  S:  Feeling some cramping. Declines pain meds.   O:  BP 109/66   Pulse 87   Temp 98 F (36.7 C) (Oral)   Resp 18   Ht 5\' 2"  (1.575 m)   Wt 61.8 kg   LMP 08/26/2020 (Exact Date)   BMI 24.91 kg/m  EFM: baseline 155 bpm/ mod variability/ + accels/ rare variable decels  Toco/IUPC: 3-4 SVE: Dilation: 1.5 Effacement (%): 60 Station: -2 Presentation: Vertex Exam by:: Myers Tutterow, CNM   A/P: 29 y.o. G3P1011 [redacted]w[redacted]d  1. Labor: latent 2. FWB: Cat II 3. Pain: analgesia/anesthesia/NO prn  Consented for FB, placed w/o difficulty, tolerated well. Continue Cytotec. Pitocin when appropriate. Anticipate SVD.  Julianne Handler, CNM 7:01 PM

## 2021-05-12 NOTE — H&P (Addendum)
Alicia [name] is a 30 y.o. female presenting for IOL secondary to cholestasis of pregnancy. She requests an epidural. She is planning on bottle feeding, outpatient circumcision for baby boy, and inpatient nexplanon placement.   Dating is [redacted]w[redacted]d based on LMP on 08/26/20.   Last [place] BPP on 2/58/52 revealed cephalic presentation, AFI WNL, and a BPP score of 8/10 due to nonreactive result with 10/10 accels on NST.  Est. FW 2170 gm, 4 lb 13 oz, 25% based off of MFM [place] on 6/29.    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, was not started on Valtrex suppressive therapy, no visible external lesions today  - trichomonas infection with negative TOC on 12/30/20  - positive GBS, patient with high penicillin allergy and GBS clindamycin resistant   OB History     Gravida  3   Para  1   Term  1   Preterm  0   AB  1   Living  1      SAB  0   IAB  1   Ectopic  0   Multiple      Live Births  1        Obstetric Comments  TAB 16 weeks        Past Medical History:  Diagnosis Date   Anemia    Blood in urine 12/23/2015   Cholestasis during pregnancy    Contraceptive management 01/22/2015   Heavy menstrual period 04/28/2014   HSV-2 infection    Urinary frequency 12/23/2015   Urinary tract infection    Vaginal discharge 12/31/2014   Vaginal itching 12/31/2014   Vaginal odor 12/03/2015   Yeast infection 12/31/2014   Past Surgical History:  Procedure Laterality Date   CHOLECYSTECTOMY N/A 12/16/2020   Procedure: LAPAROSCOPIC CHOLECYSTECTOMY;  Surgeon: Aviva Signs, MD;  Location:  [facility];  Service: General;  Laterality: N/A;   DILATION AND CURETTAGE OF UTERUS N/A 06/19/2015   Procedure: SUCTION AND SHARP UTERINE CURETTAGE;  Surgeon: Florian Buff, MD;  Location: [facility];  Service: Gynecology;  Laterality: N/A;   LIPOMA EXCISION  07/2016   left eye   NO PAST SURGERIES     Family History: family history includes Anemia in her mother; Cancer in her maternal grandmother; Diabetes in her father; Heart attack in her paternal grandfather; Hypertension in her father, mother, and paternal aunt. Social History:  reports that she has quit smoking. Her smoking use included cigarettes. She has a 1.00 pack-year smoking history. She has never used smokeless tobacco. She reports that she does not drink alcohol and does not use drugs.     Maternal Diabetes: No Genetic Screening: Abnormal:  Results: Elevated risk of Trisomy 18, Other: elevated risk of trisomy 51, triploidy Maternal Ultrasounds/Referrals: Other: mild left urinary tract dilation (4 mm) seen on anatomy US from 3/16 Fetal Ultrasounds or other Referrals:  None Maternal Substance Abuse:  Yes:  Type: Marijuana Significant Maternal Medications:  Meds include: Other:  rifampin, questran, actigall, vistaril, benadryl Significant Maternal Lab Results:  Group B Strep positive , clindamycin resistant, and patient is pen allergic with hives reaction  Other Comments:  Patient is HSV seropositive positive  Review of Systems  Constitutional:  Negative for fever.  Eyes:  Negative for visual disturbance.  Respiratory:  Negative for shortness of breath.   Cardiovascular:  Negative for chest pain.  Gastrointestinal:  Negative for abdominal pain.  Genitourinary:  Negative for pelvic pain and vaginal bleeding.  Neurological:  Negative for dizziness and headaches.  History   Blood pressure 113/83, pulse 79, temperature 97.9 F (36.6 C), temperature source Oral, resp. rate 18, height 5\' 2"  (1.575 m), weight 61.8 kg, last menstrual period  08/26/2020. Maternal Exam:  Uterine Assessment: Contraction frequency is irregular.  Abdomen: Patient reports no abdominal tenderness.  Physical Exam Constitutional:      Appearance: Normal appearance.  HENT:     Head: Normocephalic and atraumatic.  Eyes:     General: No scleral icterus.    Conjunctiva/sclera: Conjunctivae normal.  Cardiovascular:     Rate and Rhythm: Normal rate and regular rhythm.     Heart sounds: Normal heart sounds.  Pulmonary:     Effort: Pulmonary effort is normal.     Breath sounds: Normal breath sounds.  Abdominal:     General: Bowel sounds are normal.     Palpations: Abdomen is soft.     Comments: gravid  Musculoskeletal:     Right lower leg: No edema.     Left lower leg: No edema.  Skin:    Coloration: Skin is not jaundiced.  Neurological:     General: No focal deficit present.     Mental Status: She is alert and oriented to person, place, and time.  Psychiatric:        Mood and Affect: Mood normal.        Behavior: Behavior normal.        Judgment: Judgment normal.  FHR: baseline 135, moderate variability, + accels no deccels Toco: irregular   Prenatal labs: ABO, Rh: O/Positive/-- (02/15 1403) Antibody: Negative (05/17 0903) Rubella: 7.81 (02/15 1403) RPR: Non Reactive (05/17 0903)  HBsAg: Negative (02/15 1403)  HIV: Non Reactive (05/17 0903)  GBS: --Tessie Fass (07/14 1614)   Assessment/Plan:  IOL secondary to cholestasis of pregnancy  - Cytotec administered  - Foley balloon will be placed when appropriate, patient agreeable to this and has had in previous labor  - Pitocin will be initiated when appropriate  - Epidural desired   Cholestasis of pregnancy  - prn Vistaril for itching   GBS positive  - Patient has penicillin allergy, GBS resistant to clindamycin, Vancomycin ordered  Seropositive for HSV-2  - No observable active outbreaks and patient reports never having an outbreak  - Not started on suppressive Valtrex therapy    Marijuana use in pregnancy  - Patient will receive a social work consult prior to discharge   Cecilie Lowers 05/12/2021, 12:20 PM   Midwife attestation: I have seen and examined this patient; I agree with above documentation in the student's note.   PE: Gen: calm comfortable, NAD Resp: normal effort and rate Abd: gravid  ROS, labs, PMH reviewed  Assessment/Plan: [redacted] weeks gestation Labor: latent FWB: Cat I GBS: pos Admit to LD Cervical ripening Anticipate SVD  Julianne Handler, CNM  05/12/2021, 4:36 PM

## 2021-05-12 NOTE — Progress Notes (Signed)
Pharmacy Antibiotic Note  JELICIA NANTZ is a 30 y.o. female admitted on 05/12/2021 for IOL.  Pharmacy has been consulted for Vancomycin dosing for GBS prophylaxis.  Plan: Vancomycin 1 gram IV Q12H  Height: 5\' 2"  (157.5 cm) Weight: 61.8 kg (136 lb 3.2 oz) IBW/kg (Calculated) : 50.1  Temp (24hrs), Avg:97.9 F (36.6 C), Min:97.9 F (36.6 C), Max:97.9 F (36.6 C)    Estimated Creatinine Clearance: 89 mL/min (by C-G formula based on SCr of 0.61 mg/dL).    Allergies  Allergen Reactions   Penicillins Hives    Has patient had a PCN reaction causing immediate rash, facial/tongue/throat swelling, SOB or lightheadedness with hypotension: No Has patient had a PCN reaction causing severe rash involving mucus membranes or skin necrosis: No Has patient had a PCN reaction that required hospitalization No Has patient had a PCN reaction occurring within the last 10 years: No If all of the above answers are "NO", then may proceed with Cephalosporin use.    Sulfa Antibiotics Hives and Swelling    Microbiology results: 05/06/21 UCx: GBS - clinda resistant  Thank you for allowing pharmacy to be a part of this patient's care.  Beryle Lathe 05/12/2021 1:40 PM

## 2021-05-12 NOTE — Progress Notes (Signed)
Patient ID: Alicia [name], female   DOB: 1990-10-30, 30 y.o.   MRN: [id number]  Cervical foley still in place; s/p cytotec x 1 dose; s/p fentanyl x 1 dose; no itching  BP 112/68, P 77 FHR 120s, +accels, no decels UI Cx deferred (was 1+ at foley placement)  IUP@37 .0wks Cholestasis Cx unfavorable  Requesting 2nd dose of Fentanyl Plan for Pitocin when foley comes out Vistaril prn itching Anticipate vag del  Myrtis Ser [facility] 05/12/2021

## 2021-05-13 ENCOUNTER — Encounter (HOSPITAL_COMMUNITY): Payer: Self-pay | Admitting: Family Medicine

## 2021-05-13 ENCOUNTER — Inpatient Hospital Stay (HOSPITAL_COMMUNITY): Payer: Medicaid Other | Admitting: Anesthesiology

## 2021-05-13 DIAGNOSIS — O99324 Drug use complicating childbirth: Secondary | ICD-10-CM

## 2021-05-13 DIAGNOSIS — O2662 Liver and biliary tract disorders in childbirth: Secondary | ICD-10-CM

## 2021-05-13 DIAGNOSIS — Z3A37 37 weeks gestation of pregnancy: Secondary | ICD-10-CM

## 2021-05-13 DIAGNOSIS — O99824 Streptococcus B carrier state complicating childbirth: Secondary | ICD-10-CM

## 2021-05-13 LAB — RPR: RPR Ser Ql: NONREACTIVE

## 2021-05-13 MED ORDER — WITCH HAZEL-GLYCERIN EX PADS
1.0000 | MEDICATED_PAD | CUTANEOUS | Status: DC | PRN
Start: 2021-05-13 — End: 2021-05-15
  Administered 2021-05-13: 1 via TOPICAL

## 2021-05-13 MED ORDER — DIBUCAINE (PERIANAL) 1 % EX OINT: 1.0000 "application " | TOPICAL_OINTMENT | CUTANEOUS | Status: DC | PRN

## 2021-05-13 MED ORDER — COCONUT OIL OIL: 1.0000 "application " | TOPICAL_OIL | Status: DC | PRN

## 2021-05-13 MED ORDER — SODIUM CHLORIDE 0.9 % IV SOLN
25.0000 mg | Freq: Four times a day (QID) | INTRAVENOUS | Status: DC | PRN
  Filled 2021-05-13: qty 1

## 2021-05-13 MED ORDER — ACETAMINOPHEN 325 MG PO TABS: 650.0000 mg | ORAL_TABLET | ORAL | Status: DC | PRN

## 2021-05-13 MED ORDER — ONDANSETRON HCL 4 MG/2ML IJ SOLN: 4.0000 mg | INTRAMUSCULAR | Status: DC | PRN

## 2021-05-13 MED ORDER — IBUPROFEN 600 MG PO TABS
600.0000 mg | ORAL_TABLET | Freq: Four times a day (QID) | ORAL | Status: DC
Start: 2021-05-13 — End: 2021-05-15
  Administered 2021-05-13 – 2021-05-15 (×8): 600 mg via ORAL
  Filled 2021-05-13 (×8): qty 1

## 2021-05-13 MED ORDER — SIMETHICONE 80 MG PO CHEW: 80.0000 mg | CHEWABLE_TABLET | ORAL | Status: DC | PRN

## 2021-05-13 MED ORDER — ONDANSETRON HCL 4 MG PO TABS: 4.0000 mg | ORAL_TABLET | ORAL | Status: DC | PRN

## 2021-05-13 MED ORDER — DIPHENHYDRAMINE HCL 25 MG PO CAPS: 25.0000 mg | ORAL_CAPSULE | Freq: Four times a day (QID) | ORAL | Status: DC | PRN

## 2021-05-13 MED ORDER — PRENATAL MULTIVITAMIN CH
1.0000 | ORAL_TABLET | Freq: Every day | ORAL | Status: DC
  Administered 2021-05-14: 1 via ORAL
  Filled 2021-05-13: qty 1

## 2021-05-13 MED ORDER — BENZOCAINE-MENTHOL 20-0.5 % EX AERO
1.0000 "application " | INHALATION_SPRAY | CUTANEOUS | Status: DC | PRN
  Administered 2021-05-13: 1 via TOPICAL
  Filled 2021-05-13: qty 56

## 2021-05-13 MED ORDER — OXYTOCIN-SODIUM CHLORIDE 30-0.9 UT/500ML-% IV SOLN
1.0000 m[IU]/min | INTRAVENOUS | Status: DC
  Administered 2021-05-13: 2 m[IU]/min via INTRAVENOUS
  Filled 2021-05-13: qty 500

## 2021-05-13 MED ORDER — MEASLES, MUMPS & RUBELLA VAC IJ SOLR: 0.5000 mL | Freq: Once | INTRAMUSCULAR | Status: DC

## 2021-05-13 MED ORDER — FAMOTIDINE IN NACL 20-0.9 MG/50ML-% IV SOLN
20.0000 mg | Freq: Once | INTRAVENOUS | Status: AC
  Administered 2021-05-13: 20 mg via INTRAVENOUS
  Filled 2021-05-13: qty 50

## 2021-05-13 MED ORDER — LIDOCAINE HCL (PF) 1 % IJ SOLN
INTRAMUSCULAR | Status: DC | PRN
  Administered 2021-05-13: 3 mL via EPIDURAL
  Administered 2021-05-13: 4 mL via EPIDURAL

## 2021-05-13 MED ORDER — SENNOSIDES-DOCUSATE SODIUM 8.6-50 MG PO TABS
2.0000 | ORAL_TABLET | ORAL | Status: DC
  Administered 2021-05-13 – 2021-05-14 (×2): 2 via ORAL
  Filled 2021-05-13 (×2): qty 2

## 2021-05-13 MED ORDER — TETANUS-DIPHTH-ACELL PERTUSSIS 5-2.5-18.5 LF-MCG/0.5 IM SUSY
0.5000 mL | PREFILLED_SYRINGE | Freq: Once | INTRAMUSCULAR | Status: DC
Start: 2021-05-14 — End: 2021-05-13

## 2021-05-13 NOTE — Progress Notes (Signed)
Labor Progress Note CECELY MINNIS is a 30 y.o. G3P1011 at 52w1dpresented for IOL-ICP S: Patient is resting comfortably after epidural. No complaints at this time.   O:  BP 111/82   Pulse 87   Temp 97.7 F (36.5 C) (Oral)   Resp 18   Ht '5\' 2"'$  (1.575 m)   Wt 61.8 kg   LMP 08/26/2020 (Exact Date)   BMI 24.91 kg/m  EFM: baseline 130 BPM/good variability/+accels/-decels  Toco: contractions q1-463m  CVE: Dilation: 5 Effacement (%): 70 Station: -1 Presentation: Vertex Exam by:: MaPilar JarvisRN   A&P: [age].o. G3NR:[id number] for IOL-ICP # IOL: S/P cytotec x1 and FB. Will start pit 2/2 to progress labor. Monitor as tolerated.  #Pain: Epidural #FWB: cat 1 #GBS positive>Vanc   AllErskine EmeryD [facility] AM

## 2021-05-13 NOTE — Anesthesia Preprocedure Evaluation (Signed)
Anesthesia Evaluation  Patient identified by MRN, date of birth, ID band Patient awake    Reviewed: Allergy & Precautions, Patient's Chart, lab work & pertinent test results  Airway Mallampati: II  TM Distance: >3 FB Neck ROM: Full    Dental no notable dental hx. (+) Teeth Intact   Pulmonary former smoker,    Pulmonary exam normal breath sounds clear to auscultation       Cardiovascular negative cardio ROS Normal cardiovascular exam Rhythm:Regular Rate:Normal     Neuro/Psych negative neurological ROS  negative psych ROS   GI/Hepatic Neg liver ROS, GERD  ,  Endo/Other  negative endocrine ROS  Renal/GU negative Renal ROS  negative genitourinary   Musculoskeletal negative musculoskeletal ROS (+)   Abdominal   Peds  Hematology  (+) anemia ,   Anesthesia Other Findings   Reproductive/Obstetrics (+) Pregnancy                             Anesthesia Physical Anesthesia Plan  ASA: 2  Anesthesia Plan:    Post-op Pain Management:    Induction:   PONV Risk Score and Plan:   Airway Management Planned: Natural Airway  Additional Equipment:   Intra-op Plan:   Post-operative Plan:   Informed Consent: I have reviewed the patients History and Physical, chart, labs and discussed the procedure including the risks, benefits and alternatives for the proposed anesthesia with the patient or authorized representative who has indicated his/her understanding and acceptance.       Plan Discussed with: Anesthesiologist  Anesthesia Plan Comments:         Anesthesia Quick Evaluation

## 2021-05-13 NOTE — Progress Notes (Signed)
Labor Progress Note ALZINA GOLDA is a 30 y.o. G3P1011 at [redacted]w[redacted]d presented for IOL-ICP. S: Patient is resting comfortably.  O:  BP 114/82   Pulse 97   Temp 97.7 F (36.5 C) (Oral)   Resp 18   Ht 5\' 2"  (1.575 m)   Wt 61.8 kg   LMP 08/26/2020 (Exact Date)   BMI 24.91 kg/m  EFM: baseline 160 BPM/mod variability/+accels/-decels  CVE: Dilation: 5.5 Effacement (%): 70 Station: -2 Presentation: Vertex Exam by:: Derrill Memo, CNM, [place]   A&P: 30 y.o. G3P1011 [redacted]w[redacted]d IOL-ICP. #IOL: S/P cytotec x1 and FB. Pitocin was stopped due to variable decels. AROM of clear fluid@0525 . Continue to monitor.  #Pain: Epidural #FWB: cat  #GBS positive>Vanc #HSV: No active lesions   Erskine Emery, MD [facility] 5:33 AM

## 2021-05-13 NOTE — Discharge Summary (Signed)
Postpartum Discharge Summary    Patient Name: Alicia [name] DOB: 26-Dec-1990 MRN: [id number]  Date of admission: 05/12/2021 Delivery date:05/13/2021  Delivering provider: Cecilie Lowers  Date of discharge: 05/15/2021  Admitting diagnosis: Cholestasis during pregnancy [O26.619, K83.1] Intrauterine pregnancy: [redacted]w[redacted]d     Secondary diagnosis:  Active Problems:   Severe cholestasis during pregnancy, antepartum   Elevated LFTs   Cholelithiases   Abnormal chromsoml and genetic finding on antenatal screen of mother   SVD (spontaneous vaginal delivery)  Additional problems: abnormal genetic screen, hx of HSV seropositive, trichomonas infection-treated w/neg TOC, GBS carrier, cholecystectomy in first trimester.    Discharge diagnosis: Term Pregnancy Delivered                                              Post partum procedures: n/a Augmentation: AROM, Pitocin, Cytotec, and IP Foley Complications: None  Hospital course: Induction of Labor With Vaginal Delivery   30 y.o. yo G3P1011 at [redacted]w[redacted]d was admitted to the hospital 05/12/2021 for induction of labor.  Indication for induction:  ICP .  Patient had an uncomplicated labor course as follows: Membrane Rupture Time/Date: 5:23 AM ,05/13/2021   Delivery Method:Vaginal, Spontaneous SVD Episiotomy: None none Lacerations:  Labial bilateral labial Details of delivery can be found in separate delivery note.  Patient had a routine postpartum course. Patient is discharged home 05/15/21.  Newborn Data: Birth date:05/13/2021  Birth time:12:14 PM  Gender:Female  Living status:Living Living Apgars:8 ,9  Weight:2520 g   Magnesium Sulfate received: No BMZ received: No Rhophylac:N/A MMR:N/A T-DaP:Given prenatally Flu: No Transfusion:No  Physical exam  Vitals:   05/14/21 0413 05/14/21 1303 05/14/21 2358 05/15/21 0538  BP: 107/77 114/78 119/83 107/78  Pulse: 77 77 86 73  Resp: $Remo'17 16 18 18  'ZuXAb$ Temp: 98.1 F (36.7 C) 98.2 F (36.8 C) 97.9 F (36.6  C) 98.2 F (36.8 C)  TempSrc: Oral Oral Oral Oral  SpO2: 100%     Weight:      Height:       General: alert, cooperative, and no distress Lochia: appropriate Uterine Fundus: firm Incision: N/A DVT Evaluation: No evidence of DVT seen on physical exam. Labs: Lab Results  Component Value Date   WBC 7.5 05/12/2021   HGB 12.1 05/12/2021   HCT 36.2 05/12/2021   MCV 84.0 05/12/2021   PLT 294 05/12/2021   CMP Latest Ref Rng & Units 04/23/2021  Glucose 65 - 99 mg/dL 70  BUN 6 - 20 mg/dL 5(L)  Creatinine 0.57 - 1.00 mg/dL 0.61  Sodium 134 - 144 mmol/L 139  Potassium 3.5 - 5.2 mmol/L 3.9  Chloride 96 - 106 mmol/L 101  CO2 20 - 29 mmol/L 19(L)  Calcium 8.7 - 10.2 mg/dL 9.6  Total Protein 6.0 - 8.5 g/dL 6.9  Total Bilirubin 0.0 - 1.2 mg/dL 0.4  Alkaline Phos 44 - 121 IU/L 194(H)  AST 0 - 40 IU/L 22  ALT 0 - 32 IU/L 30   Edinburgh Score: Edinburgh Postnatal Depression Scale Screening Tool 05/14/2021  I have been able to laugh and see the funny side of things. 0  I have looked forward with enjoyment to things. 0  I have blamed myself unnecessarily when things went wrong. 1  I have been anxious or worried for no good reason. 1  I have felt scared or panicky for no good reason.  0  Things have been getting on top of me. 1  I have been so unhappy that I have had difficulty sleeping. 0  I have felt sad or miserable. 0  I have been so unhappy that I have been crying. 1  The thought of harming myself has occurred to me. 0  Edinburgh Postnatal Depression Scale Total 4     After visit meds:  Allergies as of 05/15/2021       Reactions   Penicillins Hives   Has patient had a PCN reaction causing immediate rash, facial/tongue/throat swelling, SOB or lightheadedness with hypotension: No Has patient had a PCN reaction causing severe rash involving mucus membranes or skin necrosis: No Has patient had a PCN reaction that required hospitalization No Has patient had a PCN reaction occurring  within the last 10 years: No If all of the above answers are "NO", then may proceed with Cephalosporin use.   Sulfa Antibiotics Hives, Swelling        Medication List     TAKE these medications    Blood Pressure Monitor Misc For regular home bp monitoring during pregnancy   cholestyramine 4 g packet Commonly known as: QUESTRAN Take 1 packet (4 g total) by mouth 2 (two) times daily.   diphenhydrAMINE 25 MG tablet Commonly known as: BENADRYL Take 25 mg by mouth every 6 (six) hours as needed.   hydrOXYzine 25 MG capsule Commonly known as: Vistaril Take 1 capsule (25 mg total) by mouth 3 (three) times daily as needed.   ibuprofen 600 MG tablet Commonly known as: ADVIL Take 1 tablet (600 mg total) by mouth every 6 (six) hours.   PRENATAL GUMMIES PO Take 2 tablets by mouth daily.   rifampin 300 MG capsule Commonly known as: RIFADIN Take 2 capsules (600 mg total) by mouth 2 (two) times daily.   ursodiol 300 MG capsule Commonly known as: Actigall Take 3 capsules (900 mg total) by mouth 3 (three) times daily.       Discharge home in stable condition Infant Feeding: Bottle Infant Disposition:home with mother Discharge instruction: per After Visit Summary and Postpartum booklet. Activity: Advance as tolerated. Pelvic rest for 6 weeks.  Diet: routine diet Future Appointments:No future appointments. Follow up Visit:  Follow-up Information     [facility] Follow up.   Specialty: Obstetrics and Gynecology Why: In 4 weeks for a postpartum appt Contact information: [address] [place] [phone]                 Please schedule this patient for a In person postpartum visit in 4 weeks with the following provider: Any provider. Additional Postpartum F/U: none   Low risk pregnancy complicated by:  n/a Delivery mode:  Vaginal, Spontaneous SVD Anticipated Birth Control:  Nexplanon   05/15/2021 Wende Mott,  CNM

## 2021-05-13 NOTE — Anesthesia Postprocedure Evaluation (Signed)
Anesthesia Post Note  Patient: Alicia [name]  Procedure(s) Performed: AN AD HOC LABOR EPIDURAL     Patient location during evaluation: Mother Baby Anesthesia Type: Epidural Level of consciousness: awake and alert Pain management: pain level controlled Vital Signs Assessment: post-procedure vital signs reviewed and stable Respiratory status: spontaneous breathing, nonlabored ventilation and respiratory function stable Cardiovascular status: stable Postop Assessment: no headache, no backache and epidural receding Anesthetic complications: no   No notable events documented.  Last Vitals:  Vitals:   05/13/21 1500 05/13/21 1615  BP: 122/82 102/71  Pulse: 80 70  Resp: 16 16  Temp: 36.4 C 36.4 C    Last Pain:  Vitals:   05/13/21 1916  TempSrc:   PainSc: 0-No pain   Pain Goal:                   Marcellino Fidalgo

## 2021-05-13 NOTE — Anesthesia Procedure Notes (Signed)
Epidural Patient location during procedure: OB Start time: 05/13/2021 1:17 AM End time: 05/13/2021 1:25 AM  Staffing Anesthesiologist: Josephine Igo, MD  Preanesthetic Checklist Completed: patient identified, IV checked, site marked, risks and benefits discussed, surgical consent, monitors and equipment checked, pre-op evaluation and timeout performed  Epidural Patient position: sitting Prep: DuraPrep and site prepped and draped Patient monitoring: continuous pulse ox and blood pressure Approach: midline Location: L3-L4 Injection technique: LOR air  Needle:  Needle type: Tuohy  Needle gauge: 17 G Needle length: 9 cm and 9 Needle insertion depth: 4 cm Catheter type: closed end flexible Catheter size: 19 Gauge Catheter at skin depth: 9 cm Test dose: negative and Other  Assessment Events: blood not aspirated, injection not painful, no injection resistance, no paresthesia and negative IV test  Additional Notes Patient identified. Risks and benefits discussed including failed block, incomplete  Pain control, post dural puncture headache, nerve damage, paralysis, blood pressure Changes, nausea, vomiting, reactions to medications-both toxic and allergic and post Partum back pain. All questions were answered. Patient expressed understanding and wished to proceed. Sterile technique was used throughout procedure. Epidural site was Dressed with sterile barrier dressing. No paresthesias, signs of intravascular injection Or signs of intrathecal spread were encountered.  Patient was more comfortable after the epidural was dosed. Please see RN's note for documentation of vital signs and FHR which are stable. Reason for block:procedure for pain

## 2021-05-13 NOTE — Progress Notes (Signed)
Labor Progress Note Alicia [name] is a 30 y.o. G3P1011 at [redacted]w[redacted]d presented for IOL for ICP  S:  Comfortable with epidural.  O:  BP 116/85   Pulse (!) 108   Temp 98 F (36.7 C) (Oral)   Resp 18   Ht 5\' 2"  (1.575 m)   Wt 61.8 kg   LMP 08/26/2020 (Exact Date)   BMI 24.91 kg/m  EFM: baseline 145 bpm/ mod variability/ + accels/ no decels  Toco/IUPC: 4-5 SVE: Dilation: 10 Dilation Complete Date: 05/13/21 Dilation Complete Time: 0930 Effacement (%): 70 Station: Plus 2 Presentation: Vertex Exam by:: Enis Riecke, CNM   A/P: 30 y.o. G3P1011 [redacted]w[redacted]d  1. Labor: second stage 2. FWB: Cat I 3. Pain: epidural    After trial of pushing with no descent will restart Pitocin and labor down.  Anticipate SVD.  Julianne Handler, CNM 11:13 AM

## 2021-05-14 NOTE — Clinical Social Work Maternal (Signed)
CLINICAL SOCIAL WORK MATERNAL/CHILD NOTE  Patient Details  Name: Alicia [name] MRN: [id number] Date of Birth: 1991/10/09  Date:  05/14/2021  Clinical Social Worker Initiating Note:  Darra Lis, [place] Date/Time: Initiated:  05/14/21/0900     Child's Name:  Alicia [name]   Biological Parents:  Mother, Father Ashok Pall 10/12/1991)   Need for Interpreter:  None   Reason for Referral:  Current Substance Use/Substance Use During Pregnancy     Address:  [address] [place] [address]    Phone number:  [phone] (home)     Additional phone number:   Household Members/Support Persons (HM/SP):   Household Member/Support Person 1   HM/SP Name Relationship DOB or Age  HM/SP -[place]. Son 03/08/2014  HM/SP -2        HM/SP -3        HM/SP -4        HM/SP -5        HM/SP -6        HM/SP -7        HM/SP -8          Natural Supports (not living in the home):  Immediate Family   Professional Supports: None   Employment: Unemployed   Type of Work:     Education:  [employer] arranged:    Museum/gallery curator Resources:  [employer]   Other Resources:  [employer], Physicist, medical     Cultural/Religious Considerations Which May Impact Care:    Strengths:  Ability to meet basic needs  , Home prepared for child     Psychotropic Medications:         Pediatrician:       Pediatrician List:   [place]      Pediatrician Fax Number:    Risk Factors/Current Problems:  Substance Use     Cognitive State:  Alert  , Goal Oriented  , Insightful  , Linear Thinking     Mood/Affect:  Calm  , Happy  , Bright  , Interested  , Comfortable     CSW Assessment: CSW consulted for THC. CSW met with MOB to assess and offer support. CSW observed MOB holding sleeping infant. CSW introduced self and role. MOB was very pleasant, forthcoming and receptive to  visit. CSW informed MOB of reason for consult and assessed current emotions. MOB shared she is doing well. MOB expressed "I'm doing better than I thought," as she smiled. MOB disclosed the pregnancy was challenging both physically and emotionally. MOB reported she was diagnosed with anxiety during pregnancy, which was treated with Hydroxyzine. MOB stated she took the medication for a short period of time before discontinuing. MOB shared she found the medication, as well as therapy to be helpful. MOB attended therapy with [facility], however she is no longer attending. MOB denies experiencing any current mental health symptoms and reported she has access to her Hydroxyzine prescription if needed. MOB identified her mother as a primary support. FOB is a support, however he is currently incarcerated. MOB denies any current SI, HI or being involved in DV.   CSW inquired on substance use. MOB disclosed she ate a THC edible in March. MOB stated that is the last time she used a substance. CSW informed MOB of the hospital drug screen policy. MOB aware infant's UDS  is positive for THC and the CDS will be followed. CSW informed MOB that a CPS report will be filed with [facility] and inquired on any previous CPS history. MOB expressed understanding and denies any previous CPS history.   CSW provided education regarding the baby blues period versus PPD and provided resources. CSW provided the New Mom Checklist and encouraged MOB to self evaluate and contact a medical professional if symptoms are noted at any time.    CSW provided review of Sudden Infant Death Syndrome (SIDS) precautions.  MOB reported she has all needs for infant, including a bassinet and car seat. MOB denies any barriers to follow-up care. MOB reports no additional needs at this time.  CPS report filed with [facility]. CDS will be followed and an additional CPS report will be filed if warranted. CSW identifies no further  need for intervention and no barriers to discharge at this time.  CSW Plan/Description:  No Further Intervention Required/No Barriers to Discharge, CSW Will Continue to Monitor Umbilical Cord Tissue Drug Screen Results and Make Report if Warranted, Child Protective Service Report  , [place], Perinatal Mood and Anxiety Disorder (PMADs) Education, Sudden Infant Death Syndrome (SIDS) Education, Other Information/Referral to [employer], [place] 05/14/2021, 10:09 AM

## 2021-05-14 NOTE — Progress Notes (Signed)
Post Partum Day 1 Subjective: no complaints, up ad lib, voiding, tolerating PO, and + flatus  Objective: Blood pressure 107/77, pulse 77, temperature 98.1 F (36.7 C), temperature source Oral, resp. rate 17, height '5\' 2"'$  (1.575 m), weight 61.8 kg, last menstrual period 08/26/2020, SpO2 100 %, unknown if currently breastfeeding.  Physical Exam:  General: alert, cooperative, and no distress Lochia: appropriate Uterine Fundus: firm Incision: n/a DVT Evaluation: No evidence of DVT seen on physical exam.  Recent Labs    05/12/21 1334  HGB 12.1  HCT 36.2    Assessment/Plan: Patient doing well without complaint. States pain has improved since delivery but is constant 4/10. Improves with ibuprofen and feels like it is well managed. Desires discharge but unsure if baby will be discharged today. Will let team know.    LOS: 2 days   [place] 05/14/2021, 9:24 AM

## 2021-05-15 MED ORDER — IBUPROFEN 600 MG PO TABS: 600.0000 mg | ORAL_TABLET | Freq: Four times a day (QID) | ORAL | 0 refills | Status: DC

## 2021-05-25 ENCOUNTER — Telehealth (HOSPITAL_COMMUNITY): Payer: Self-pay | Admitting: *Deleted

## 2021-05-25 NOTE — Telephone Encounter (Signed)
Attempted Hospital Discharge Follow-Up Call.  Left voice mail requesting that patient return RN's phone call.  

## 2021-06-02 ENCOUNTER — Inpatient Hospital Stay (HOSPITAL_COMMUNITY): Admit: 2021-06-02 | Payer: Self-pay

## 2021-06-23 ENCOUNTER — Encounter: Payer: Self-pay | Admitting: Women's Health

## 2021-06-23 ENCOUNTER — Ambulatory Visit (INDEPENDENT_AMBULATORY_CARE_PROVIDER_SITE_OTHER): Payer: Medicaid Other | Admitting: Women's Health

## 2021-06-23 ENCOUNTER — Other Ambulatory Visit: Payer: Self-pay

## 2021-06-23 DIAGNOSIS — Z3202 Encounter for pregnancy test, result negative: Secondary | ICD-10-CM

## 2021-06-23 DIAGNOSIS — Z30017 Encounter for initial prescription of implantable subdermal contraceptive: Secondary | ICD-10-CM | POA: Insufficient documentation

## 2021-06-23 DIAGNOSIS — Z8719 Personal history of other diseases of the digestive system: Secondary | ICD-10-CM

## 2021-06-23 DIAGNOSIS — Z8759 Personal history of other complications of pregnancy, childbirth and the puerperium: Secondary | ICD-10-CM

## 2021-06-23 LAB — POCT URINE PREGNANCY: Preg Test, Ur: NEGATIVE

## 2021-06-23 MED ORDER — ETONOGESTREL 68 MG ~~LOC~~ IMPL
68.0000 mg | DRUG_IMPLANT | Freq: Once | SUBCUTANEOUS | Status: AC
  Administered 2021-06-23: 68 mg via SUBCUTANEOUS

## 2021-06-23 NOTE — Progress Notes (Signed)
POSTPARTUM VISIT Patient name: Alicia [name] MRN [id number]  Date of birth: May 31, 1991 Chief Complaint:   Postpartum Care  History of Present Illness:   Alicia [name] is a 30 y.o. G30P2012 African American female being seen today for a postpartum visit. She is 5 weeks postpartum following a spontaneous vaginal delivery at 37.1 gestational weeks. IOL: yes, for intrahepatic cholestasis of pregnancy . Anesthesia: epidural.  Laceration: none.  Complications: none. Inpatient contraception: no.   Pregnancy complicated by severe cholestasis dx at [facility] . Tobacco use: former . Substance use disorder: no. Last pap smear: 01/28/20 and results were NILM w/ HRHPV negative. Next pap smear due: 2024 No LMP recorded.  Postpartum course has been uncomplicated. Bleeding none. Bowel function is normal. Bladder function is normal. Urinary incontinence? no, fecal incontinence? no Patient is not sexually active. Last sexual activity: prior to birth of baby. Desired contraception: Nexplanon. Patient does not know want a pregnancy in the future.  Desired family size is uncertain #of children.   Upstream - 06/23/21 1201       Pregnancy Intention Screening   Does the patient want to become pregnant in the next year? No    Does the patient's partner want to become pregnant in the next year? No    Would the patient like to discuss contraceptive options today? Yes      Contraception Wrap Up   Current Method Abstinence    End Method Hormonal Implant    Contraception Counseling Provided Yes            The pregnancy intention screening data noted above was reviewed. Potential methods of contraception were discussed. The patient elected to proceed with Hormonal Implant.  Edinburgh Postpartum Depression Screening: negative  Edinburgh Postnatal Depression Scale - 06/23/21 1202       Edinburgh Postnatal Depression Scale:  In the Past 7 Days   I have been able to laugh and see the funny side of things. 0     I have looked forward with enjoyment to things. 0    I have blamed myself unnecessarily when things went wrong. 0    I have been anxious or worried for no good reason. 0    I have felt scared or panicky for no good reason. 0    Things have been getting on top of me. 0    I have been so unhappy that I have had difficulty sleeping. 0    I have felt sad or miserable. 0    I have been so unhappy that I have been crying. 0    The thought of harming myself has occurred to me. 0    Edinburgh Postnatal Depression Scale Total 0             GAD 7 : Generalized Anxiety Score 04/12/2021 03/09/2021 12/08/2020 01/28/2020  Nervous, Anxious, on Edge 0 0 0 0  Control/stop worrying 0 0 0 0  Worry too much - different things 0 0 1 0  Trouble relaxing 0 0 0 0  Restless 0 0 0 0  Easily annoyed or irritable 0 0 0 0  Afraid - awful might happen 0 0 0 0  Total GAD 7 Score 0 0 1 0     Baby's course has been uncomplicated. Baby is feeding by bottle. Infant has a pediatrician/family doctor? Yes.  Childcare strategy if returning to work/school: n/a-stay at home mom.  Pt has material needs met for her and baby: Yes.   Review  of Systems:   Pertinent items are noted in HPI Denies Abnormal vaginal discharge w/ itching/odor/irritation, headaches, visual changes, shortness of breath, chest pain, abdominal pain, severe nausea/vomiting, or problems with urination or bowel movements. Pertinent History Reviewed:  Reviewed past medical,surgical, obstetrical and family history.  Reviewed problem list, medications and allergies. OB History  Gravida Para Term Preterm AB Living  _0 0 1 2  SAB IAB Ectopic Multiple Live Births  0 1 0 0 2    # Outcome Date GA Lbr Len/2nd Weight Sex Delivery Anes PTL Lv  3 Term 05/13/21 63w1d07:30 / 02:44 5 lb 8.9 oz (2.52 kg) M Vag-Spont EPI  LIV  2 IAB 04/29/15 169w4d       1 Term 03/08/14 3823w1d:05 / 07:02 5 lb 11.9 oz (2.605 kg) M Vag-Spont EPI  LIV     Complications:  Cholestasis    Obstetric Comments  TAB 16 weeks   Physical Assessment:   Vitals:   06/23/21 1158  BP: 117/79  Pulse: 85  Weight: 122 lb (55.3 kg)  Height: _1  (1.575 m)  Body mass index is 22.31 kg/m.       Physical Examination:   General appearance: alert, well appearing, and in no distress  Mental status: alert, oriented to person, place, and time  Skin: warm & dry   Cardiovascular: normal heart rate noted   Respiratory: normal respiratory effort, no distress   Breasts: deferred, no complaints   Abdomen: soft, non-tender   Pelvic: examination not indicated. Thin prep pap obtained: No  Rectal: not examined  Extremities: Edema: none   Chaperone: N/A         Results for orders placed or performed in visit on 06/23/21 (from the past 24 hour(s))  POCT urine pregnancy   Collection Time: 06/23/21 12:05 PM  Result Value Ref Range   Preg Test, Ur Negative Negative     NEXPLANON INSERTION  Risks/benefits/side effects of Nexplanon have been discussed and her questions have been answered.  Specifically, a failure rate of 10/998 has been reported, with an increased failure rate if pt takes [place]/or antiseizure medicaitons.  She is aware of the common side effect of irregular bleeding, which the incidence of decreases over time. Signed copy of informed consent in chart.   Time out was performed.  She is right-handed, so her left arm, approximately 10cm from the medial epicondyle and 3-5cm posterior to the sulcus, was cleansed with alcohol and anesthetized with 2cc of 2% Lidocaine.  The area was cleansed again with betadine and the Nexplanon was inserted per manufacturer's recommendations without difficulty.  3 steri-strips and pressure bandage were applied. The patient tolerated the procedure well.   Assessment & Plan:  1) Postpartum exam 2) 5 wks s/p spontaneous vaginal delivery after IOL for severe ICP dx @ 10wks 3) bottle feeding 4) Depression screening 5)  Nexplanon insertion Pt was instructed to keep the area clean and dry, remove pressure bandage in 24 hours, and keep insertion site covered with the steri-strip for 3-5 days.  Condoms for 2 weeks.  She was given a card indicating date Nexplanon was inserted and date it needs to be removed. Follow-up PRN problems.  Essential components of care per ACOG recommendations:  1.  Mood and well being:  If positive depression screen, discussed and plan developed.  If using tobacco we discussed reduction/cessation and risk of relapse If current substance abuse, we discussed and referral to local resources  was offered.   2. Infant care and feeding:  If breastfeeding, discussed returning to work, pumping, breastfeeding-associated pain, guidance regarding return to fertility while lactating if not using another method. If needed, patient was provided with a letter to be allowed to pump q 2-3hrs to support lactation in a private location with access to a refrigerator to store breastmilk.   Recommended that all caregivers be immunized for flu, pertussis and other preventable communicable diseases If pt does not have material needs met for her/baby, referred to local resources for help obtaining these.  3. Sexuality, contraception and birth spacing Provided guidance regarding sexuality, management of dyspareunia, and resumption of intercourse Discussed avoiding interpregnancy interval <42mhs and recommended birth spacing of 18 months  4. Sleep and fatigue Discussed coping options for fatigue and sleep disruption Encouraged family/partner/community support of 4 hrs of uninterrupted sleep to help with mood and fatigue  5. Physical recovery  If pt had a C/S, assessed incisional pain and providing guidance on normal vs prolonged recovery If pt had a laceration, perineal healing and pain reviewed.  If urinary or fecal incontinence, discussed management and referred to PT or uro/gyn if indicated  Patient is safe  to resume physical activity. Discussed attainment of healthy weight.  6.  Chronic disease management Discussed pregnancy complications if any, and their implications for future childbearing and long-term maternal health. Review recommendations for prevention of recurrent pregnancy complications, such as 17 hydroxyprogesterone caproate to reduce risk for recurrent PTB not applicable, or aspirin to reduce risk of preeclampsia not applicable. Pt had GDM: No. If yes, 2hr GTT scheduled: not applicable. Reviewed medications and non-pregnant dosing including consideration of whether pt is breastfeeding using a reliable resource such as LactMed: not applicable Referred for f/u w/ PCP or subspecialist providers as indicated: not applicable  7. Health maintenance Mammogram at [place] earlier if indicated Pap smears as indicated  Meds:  Meds ordered this encounter  Medications   etonogestrel (NEXPLANON) implant 68 mg    Follow-up: Return in about 1 year (around 06/23/2022) for Physical.   Orders Placed This Encounter  Procedures   POCT urine pregnancy    [facility]8/31/2022 1:47 PM

## 2021-06-23 NOTE — Patient Instructions (Signed)
Keep the area clean and dry.  You can remove the big bandage in 24 hours, and the small steri-strip bandage in 3-5 days.  A back up method, such as condoms, should be used for two weeks. You may have irregular vaginal bleeding for the first 6 months after the Nexplanon is placed, then the bleeding usually lightens and it is possible that you may not have any periods.  If you have any concerns, please give [place] a call.    Etonogestrel Implant What is this medication? ETONOGESTREL (et oh noe JES trel) prevents ovulation and pregnancy. It belongs to a group of medications called contraceptives. This medication is a progestin hormone. This medicine may be used for other purposes; ask your health care provider or pharmacist if you have questions. COMMON BRAND NAME(S): Implanon, Nexplanon What should I tell my care team before I take this medication? They need to know if you have any of these conditions: Abnormal vaginal bleeding Blood vessel disease or blood clots Breast, cervical, endometrial, ovarian, liver, or uterine cancer Diabetes Gallbladder disease Heart disease or recent heart attack High blood pressure High cholesterol or triglycerides Kidney disease Liver disease Migraine headaches Seizures Stroke Tobacco smoker An unusual or allergic reaction to etonogestrel, anesthetics or antiseptics, other medications, foods, dyes, or preservatives Pregnant or trying to get pregnant Breast-feeding How should I use this medication? This device is inserted just under the skin on the inner side of your upper arm by your care team. Talk to your care team about the use of this medication in children. Special care may be needed. Overdosage: If you think you have taken too much of this medicine contact a poison control center or emergency room at once. NOTE: This medicine is only for you. Do not share this medicine with others. What if I miss a dose? This does not apply. What may interact with this  medication? Do not take this medication with any of the following: Amprenavir Fosamprenavir This medication may also interact with the following: Acitretin Aprepitant Armodafinil Bexarotene Bosentan Carbamazepine Certain medications for fungal infections like fluconazole, ketoconazole, itraconazole and voriconazole Certain medications to treat hepatitis, HIV or AIDS Cyclosporine Felbamate Griseofulvin Lamotrigine Modafinil Oxcarbazepine Phenobarbital Phenytoin Primidone Rifabutin Rifampin Rifapentine St. John's wort Topiramate This list may not describe all possible interactions. Give your health care provider a list of all the medicines, herbs, non-prescription drugs, or dietary supplements you use. Also tell them if you smoke, drink alcohol, or use illegal drugs. Some items may interact with your medicine. What should I watch for while using this medication? This product does not protect you against HIV infection (AIDS) or other sexually transmitted diseases. You should be able to feel the implant by pressing your fingertips over the skin where it was inserted. Contact your care team if you cannot feel the implant, and use a non-hormonal birth control method (such as condoms) until your care team confirms that the implant is in place. Contact your care team if you think that the implant may have broken or become bent while in your arm. You will receive a user card from your care team after the implant is inserted. The card is a record of the location of the implant in your upper arm and when it should be removed. Keep this card with your health records. What side effects may I notice from receiving this medication? Side effects that you should report to your care team as soon as possible: Allergic reactions-skin rash, itching, hives, swelling of  the face, lips, tongue, or throat Blood clot-pain, swelling, or warmth in the leg, shortness of breath, chest pain Gallbladder  problems-severe stomach pain, nausea, vomiting, fever Increase in blood pressure Liver injury-right upper belly pain, loss of appetite, nausea, light-colored stool, dark yellow or brown urine, yellowing skin or eyes, unusual weakness or fatigue New or worsening migraines or headaches Pain, redness, or irritation at injection site Stroke-sudden numbness or weakness of the face, arm, or leg, trouble speaking, confusion, trouble walking, loss of balance or coordination, dizziness, severe headache, change in vision Unusual vaginal discharge, itching, or odor Worsening mood, feelings of depression Side effects that usually do not require medical attention (report to your care team if they continue or are bothersome): Breast pain or tenderness Dark patches of skin on the face or other sun-exposed areas Irregular menstrual cycles or spotting Nausea Weight gain This list may not describe all possible side effects. Call your doctor for medical advice about side effects. You may report side effects to FDA at 1-800-FDA-1088. Where should I keep my medication? This medication is given in a hospital or clinic and will not be stored at home. NOTE: This sheet is a summary. It may not cover all possible information. If you have questions about this medicine, talk to your doctor, pharmacist, or health care provider.  2022 Elsevier/Gold Standard (2020-11-17 09:15:27)

## 2021-08-31 ENCOUNTER — Other Ambulatory Visit: Payer: Self-pay | Admitting: Women's Health

## 2021-08-31 MED ORDER — MEGESTROL ACETATE 40 MG PO TABS: ORAL_TABLET | ORAL | 1 refills | Status: DC

## 2021-10-15 ENCOUNTER — Other Ambulatory Visit: Payer: Self-pay | Admitting: Women's Health

## 2021-10-20 ENCOUNTER — Other Ambulatory Visit: Payer: Self-pay | Admitting: Adult Health

## 2021-10-20 ENCOUNTER — Encounter: Payer: Self-pay | Admitting: Women's Health

## 2021-10-20 MED ORDER — METRONIDAZOLE 500 MG PO TABS
500.0000 mg | ORAL_TABLET | Freq: Two times a day (BID) | ORAL | 0 refills | Status: DC
Start: 2021-10-20 — End: 2023-07-19

## 2021-10-20 NOTE — Progress Notes (Signed)
Rx flagyl  

## 2021-12-28 ENCOUNTER — Other Ambulatory Visit: Payer: Self-pay | Admitting: Women's Health

## 2021-12-28 ENCOUNTER — Telehealth: Payer: Self-pay | Admitting: Women's Health

## 2021-12-28 MED ORDER — MEGESTROL ACETATE 40 MG PO TABS: ORAL_TABLET | ORAL | 3 refills | Status: DC

## 2021-12-28 NOTE — Telephone Encounter (Signed)
Patient called stating that she requested a refill request on her mychart for her megestrol but she is not sure if kim got the refill request. Patient states she uses [facility]. Please contact pt ?

## 2022-02-20 IMAGING — US US MFM OB FOLLOW-UP
1 series · 13 of 28 positions shown · non-contrast
Comparison: none

[Series 1: us mfm ob follow-up · 13 of 66 slices shown]
[im 3/66]
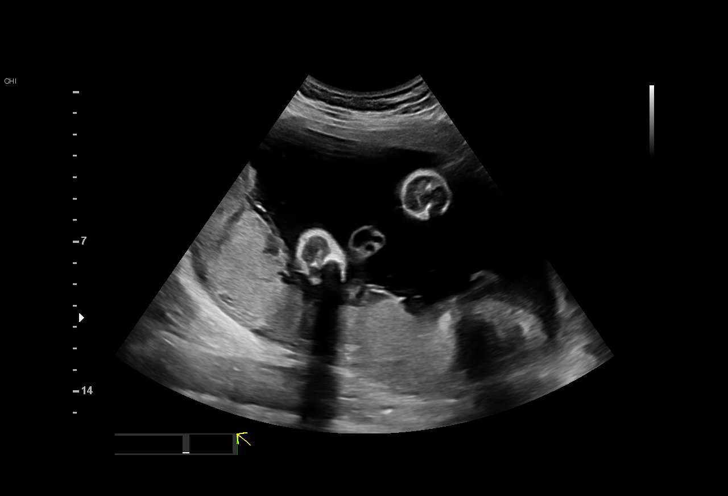
[im 8/66]
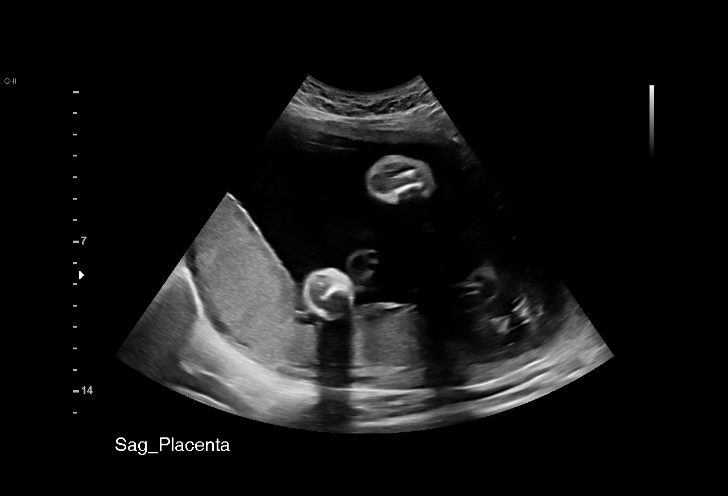
[im 13/66]
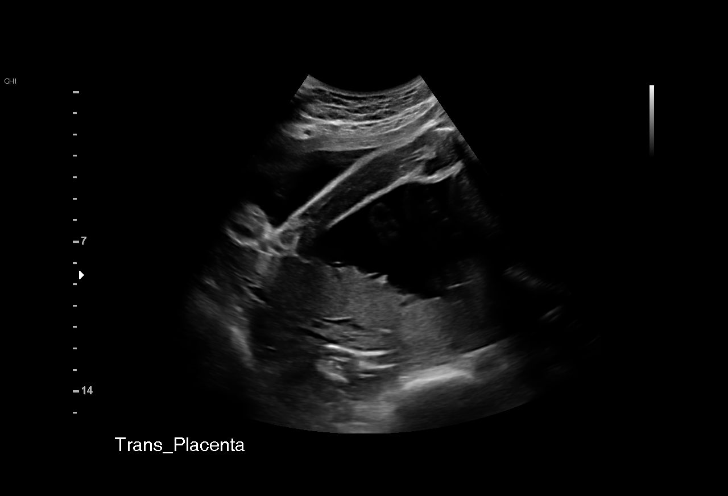
[im 17/66]
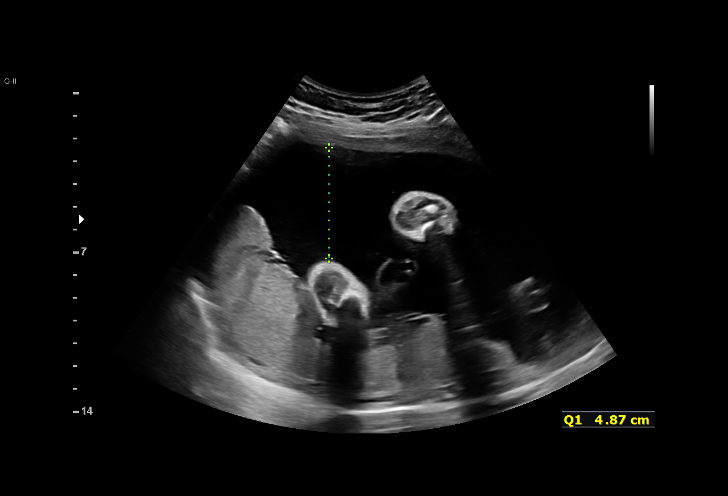
[im 22/66]
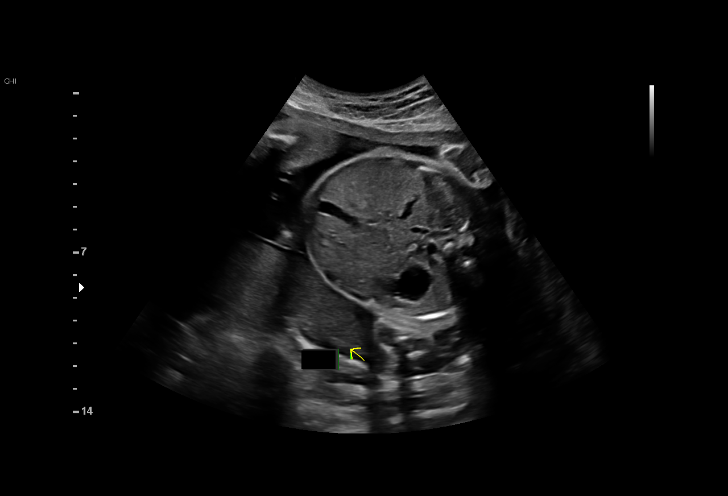
[im 27/66]
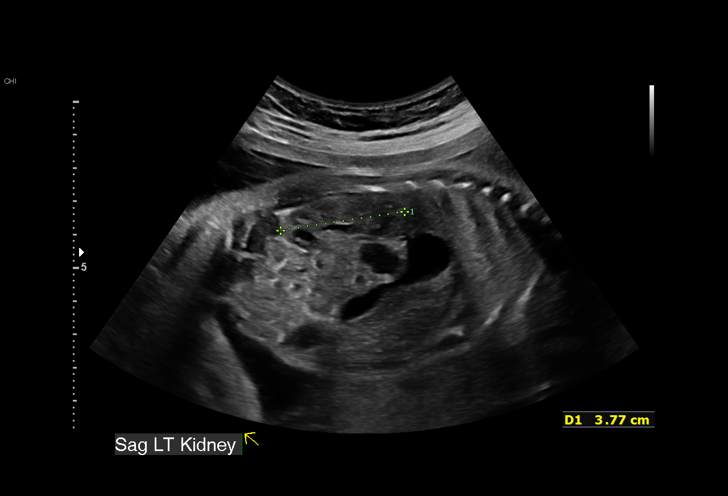
[im 34/66]
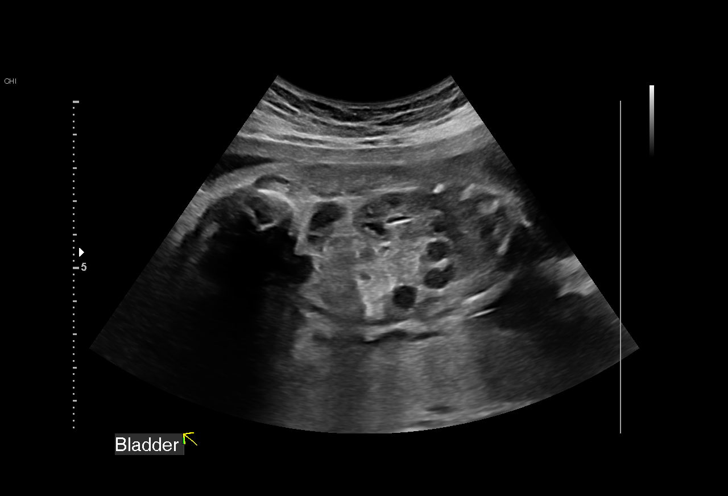
[im 39/66]
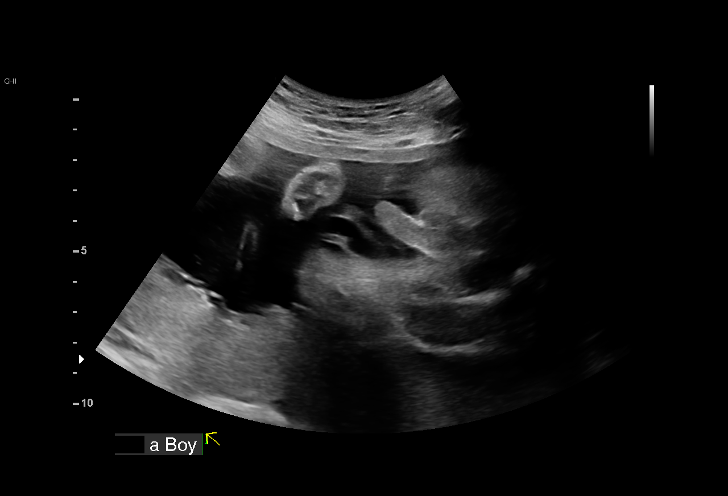
[im 44/66]
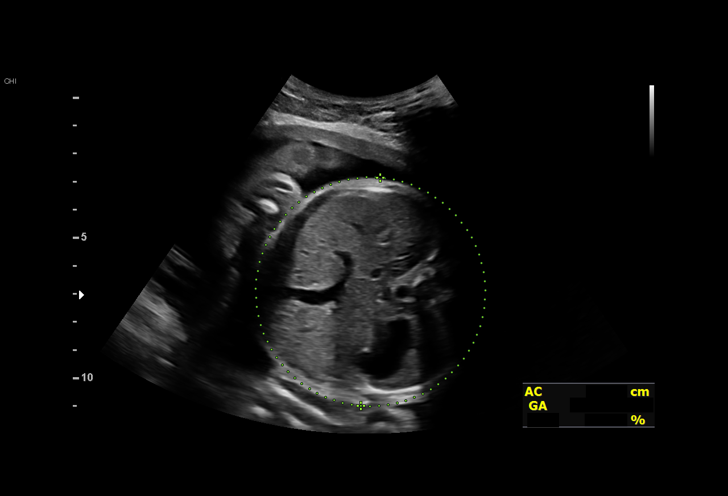
[im 49/66]
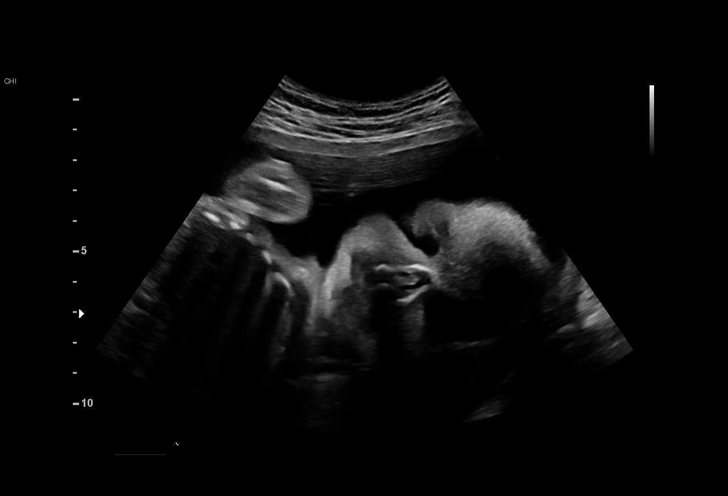
[im 53/66]
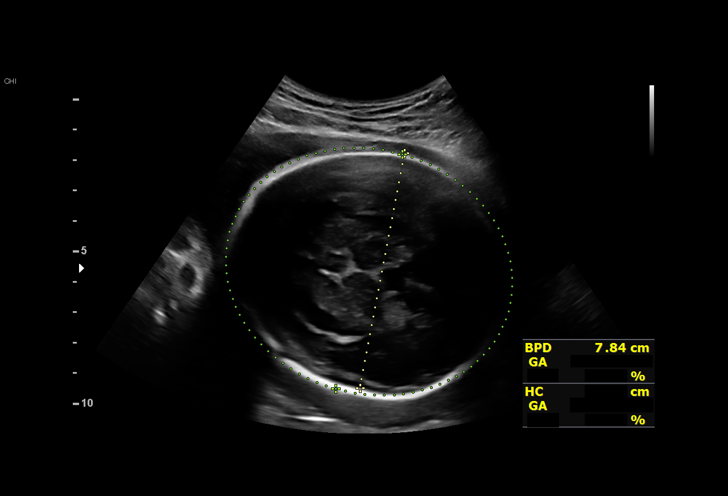
[im 58/66]
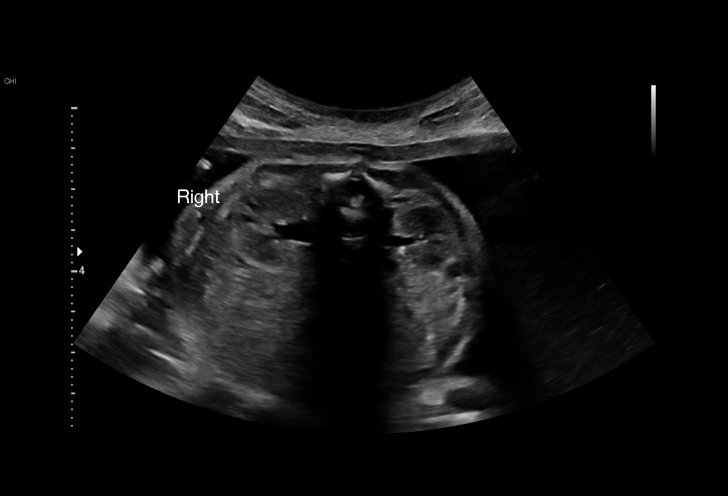
[im 63/66]
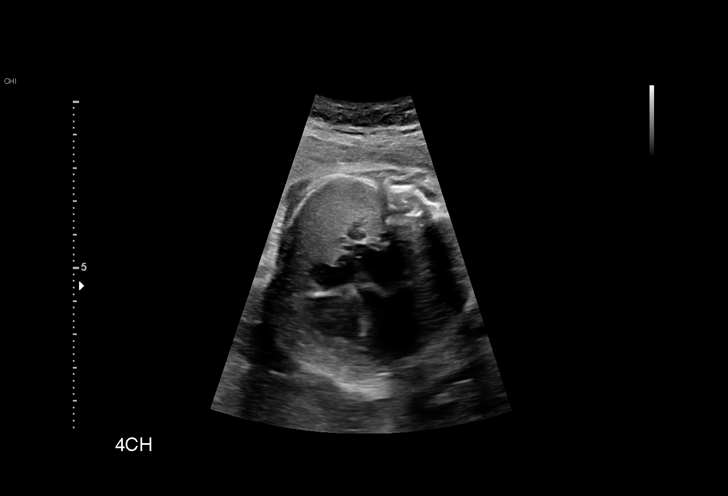

[13 of 28 positions shown; findings below may reference images not displayed]

OB/GYN
                   [facility]

Indications

 Abnormal biochemical screen Low FF 2.3%
 (High Risk T18, 13, triploidy)
 Encounter for antenatal screening for
 malformations
 Herpes simplex virus (GYANSAH 2)
 Pyelectasis of fetus on prenatal ultrasound
 29 weeks gestation of pregnancy
Fetal Evaluation

 Num Of Fetuses:          1
 Fetal Heart              137
 Rate(bpm):
 Cardiac Activity:        Observed
 Presentation:            Cephalic
 Placenta:                Posterior
 P. Cord Insertion:       Previously Visualized

 Amniotic Fluid
 AFI FV:      Within normal limits

 AFI Sum(cm)     %Tile       Largest Pocket(cm)
 16.73           61

 RUQ(cm)       RLQ(cm)       LUQ(cm)        LLQ(cm)

Biometry

 BPD:      78.2  mm     G. Age:  31w 3d         83  %    CI:        79.76   %    70 - 86
                                                         FL/HC:      20.6   %    19.2 -
 HC:     276.7   mm     G. Age:  30w 2d         26  %    HC/AC:      1.08        0.99 -
 AC:     255.5   mm     G. Age:  29w 5d         40  %    FL/BPD:     72.9   %    71 - 87
 FL:         57  mm     G. Age:  29w 6d         36  %    FL/AC:      22.3   %    20 - 24

 Est. FW:   3717    g      3 lb 4 oz     40  %
                    m
OB History

 Gravidity:    3         Term:   1        Prem:   0         SAB:  0
 TOP:          1       Ectopic:  0        Living: 1
Gestational Age

 LMP:           29w 6d        Date:  08/26/20                 EDD:   06/02/21
 U/S Today:     30w 2d                                        EDD:   05/30/21
 Best:          29w 6d     Det. By:  LMP  (08/26/20)           EDD:  06/02/21
Anatomy

 Cranium:               Appears normal         LVOT:                   Previously seen
 Cavum:                 Appears normal         Aortic Arch:            Previously seen
 Ventricles:            Previously seen        Ductal Arch:            Previously seen
 Choroid Plexus:        Previously seen        Diaphragm:              Appears normal
 Cerebellum:            Previously seen        Stomach:                Appears normal,
                                                                       left sided
 Posterior Fossa:       Previously seen        Abdomen:                Appears normal
 Nuchal Fold:           Not applicable (>20    Abdominal Wall:         Previously seen
                        wks GA)
 Face:                  Orbits and profile     Cord Vessels:           Appears normal (3
                        previously seen                                vessel cord)
 Lips:                  Previously seen        Kidneys:                Appear normal
 Palate:                Not well visualized    Bladder:                Appears normal
 Thoracic:              Appears normal         Spine:                  Previously seen
 Heart:                 Appears normal         Upper Extremities:      Previously seen
                        (4CH, axis, and
                        situs)
 RVOT:                  Previously seen        Lower Extremities:      Previously seen

 Other:  Fetus previously appears to be a male. Heels visualized. Nasal
         bone previously visualized.  Kidneys RT pelvis=5.3 mm, Lt= 3.3mm.
Cervix Uterus Adnexa

 Cervix
 Not visualized (advanced GA >67wks)
Comments

 This patient was seen for a follow up growth scan due to an
 abnormal cell free DNA test.  She reports that she was
 recently diagnosed with cholestasis of pregnancy that is
 currently treated with Actigall 900 mg daily.
 She was informed that the fetal growth and amniotic fluid
 level appears appropriate for her gestational age.
 Fetal movements were noted throughout today's exam.
 Due to cholestasis of pregnancy, weekly biophysical profiles
 should be started at around 32 weeks.  Delivery is
 recommended at around 37 weeks.
 A biophysical profile scheduled in our office in 2 weeks.
 Please cancel the weekly biophysical profiles in our office
 should you want her to have the biophysical profiles
 performed in your office.

## 2022-03-12 IMAGING — US US FETAL BPP W/ NON-STRESS
1 series · 13 of 13 positions shown · non-contrast
Comparison: none

[Series 1: us fetal bpp w/ non-stress · 13 acquisitions, 13 frames shown]
[im 1/13]
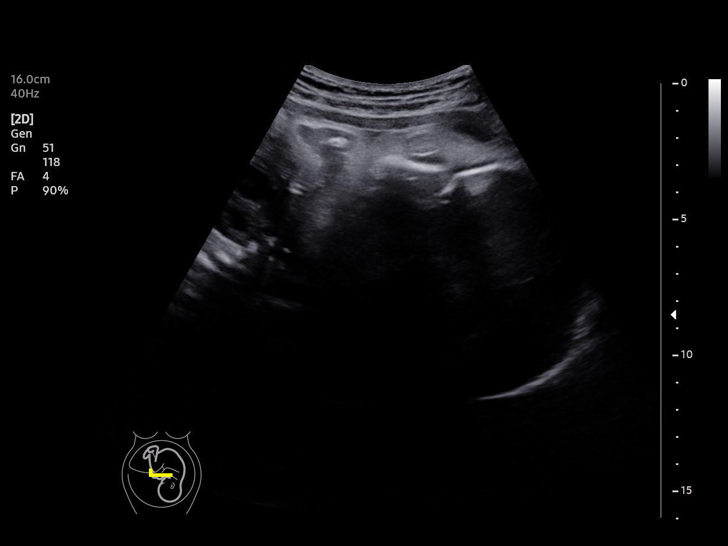
[im 2/13]
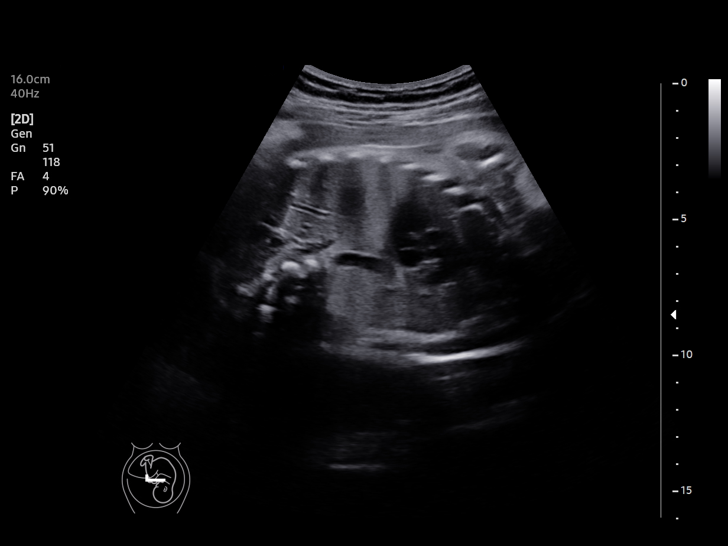
[im 3/13]
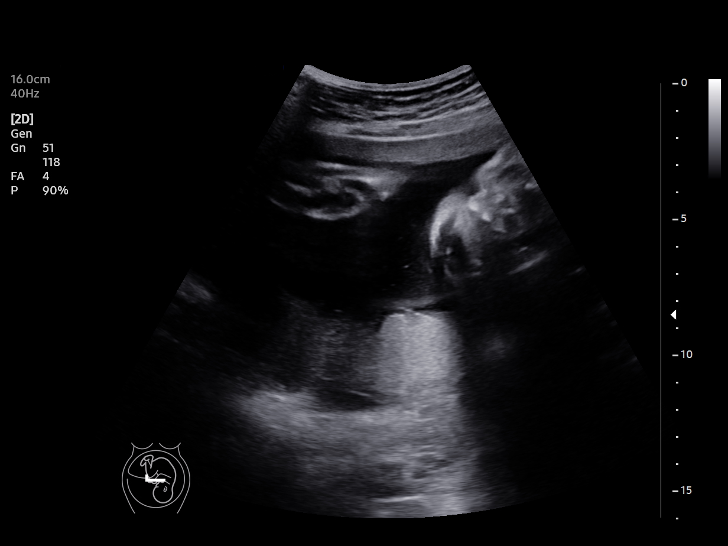
[im 4/13]
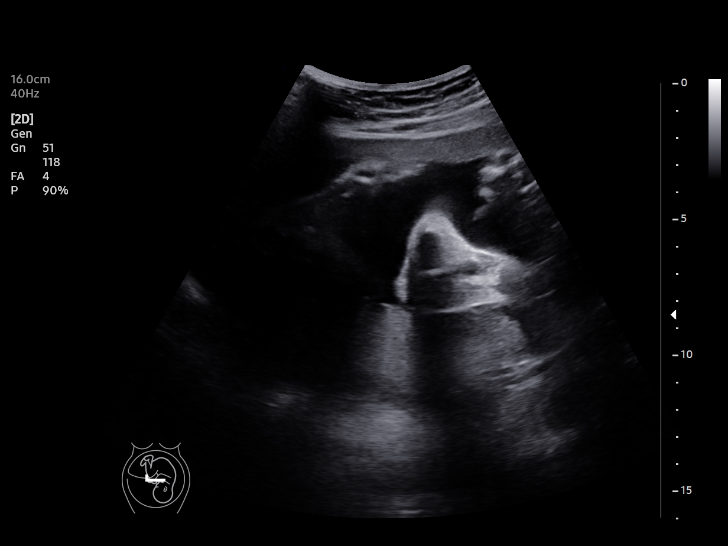
[im 5/13]
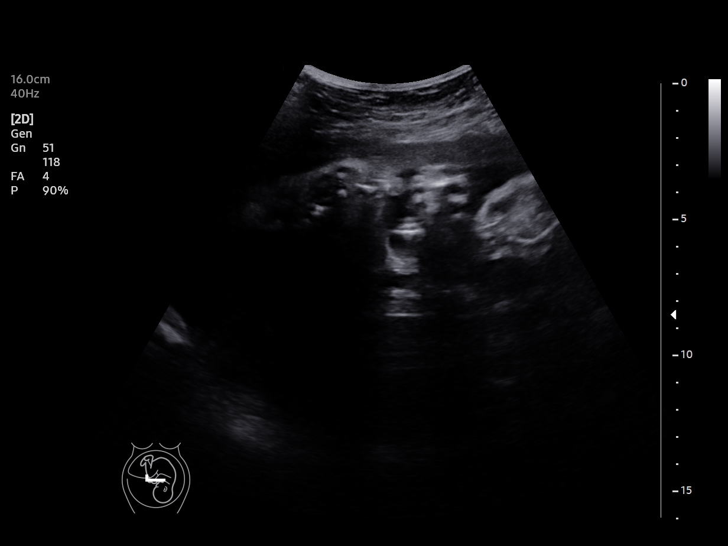
[im 6/13]
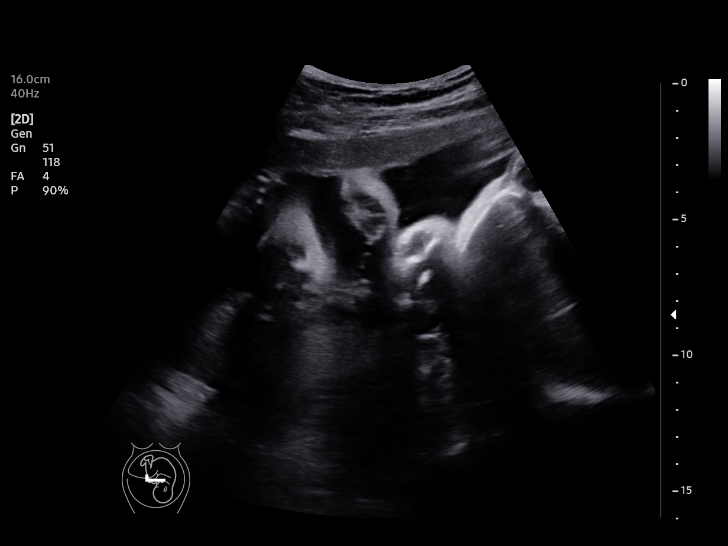
[im 7/13]
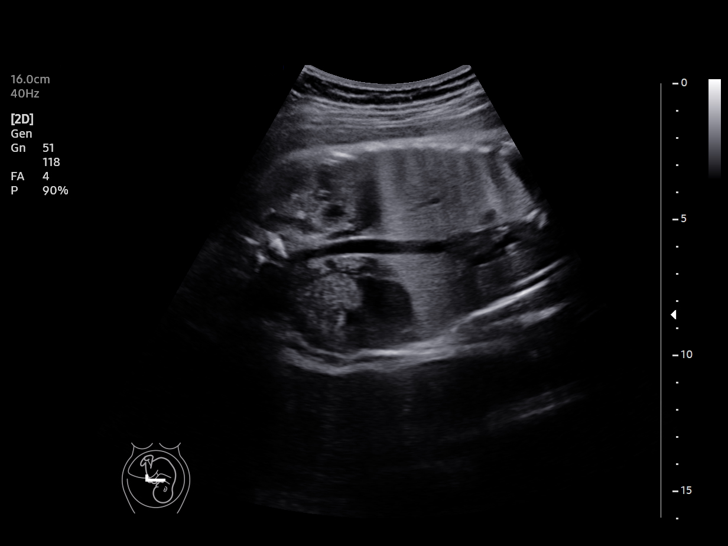
[im 8/13]
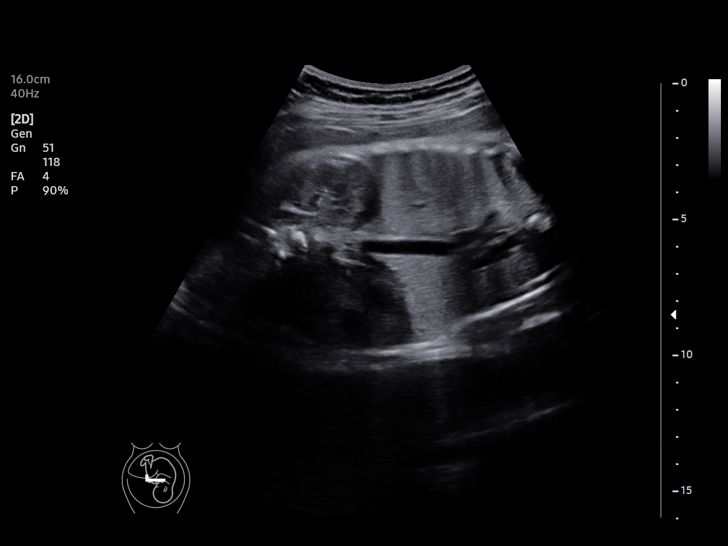
[im 9/13]
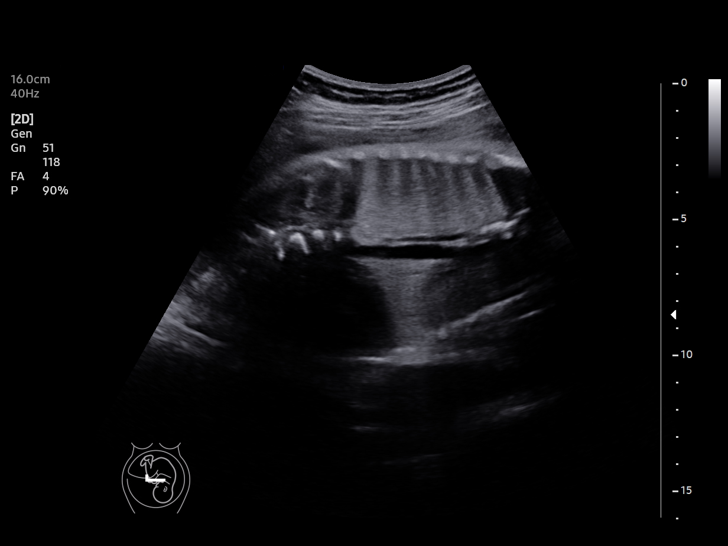
[im 10/13]
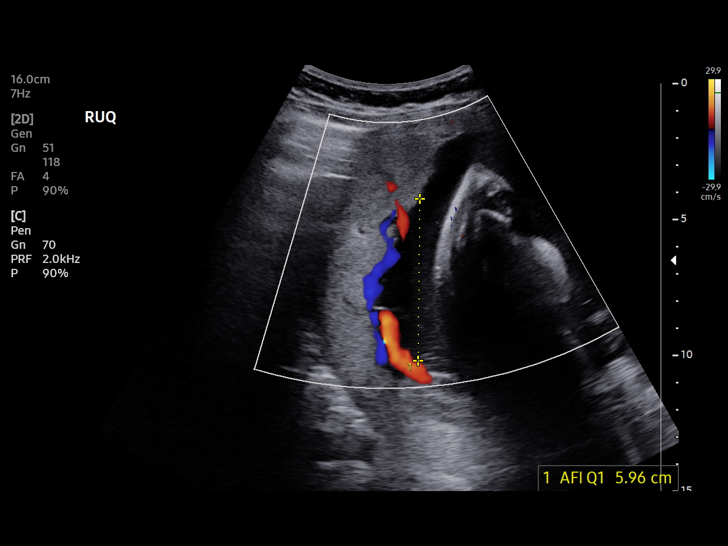
[im 11/13]
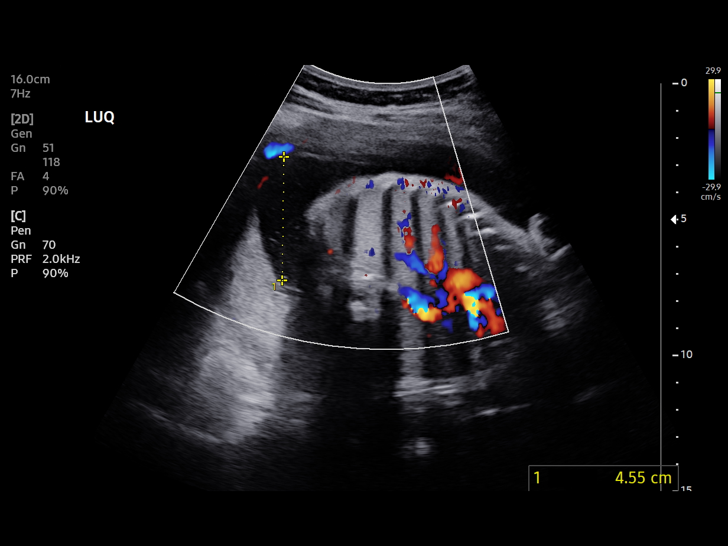
[im 12/13]
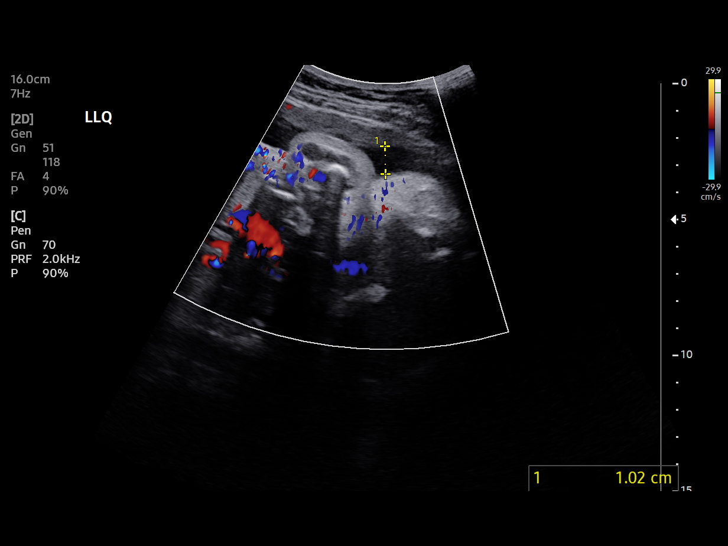
[im 13/13]
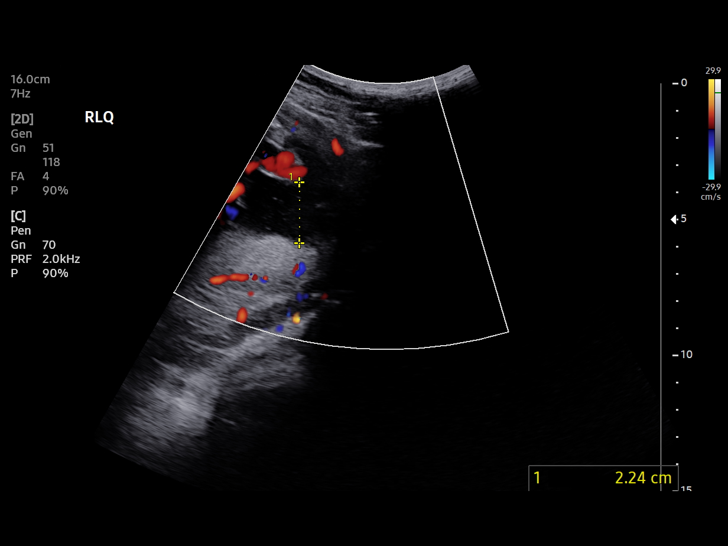

[13 of 13 positions shown; findings below may reference images not displayed]

Attending:        Sakshi Casteel         Ref. Address:     [HOSPITAL]
                                                            OB/GYN
                                                            [REDACTED]care at
                   [facility]

 1  US FETAL BPP W/NONSTRESS              [address].4     [place]

Indications

 32 weeks gestation of pregnancy
 Medical complication of pregnancy (specify)
 cholestasis
Fetal Evaluation

 Num Of Fetuses:         1
 Preg. Location:         Intrauterine
 Cardiac Activity:       Observed
 Fetal Lie:              Maternal left side
 Presentation:           Cephalic

 Amniotic Fluid
 AFI FV:      Within normal limits

 AFI Sum(cm)     %Tile       Largest Pocket(cm)
 13.77           46

 RUQ(cm)       RLQ(cm)       LUQ(cm)        LLQ(cm)

Biophysical Evaluation

 Amniotic F.V:   Pocket => 2 cm             F. Tone:        Observed
 F. Movement:    Observed                   N.S.T:          Reactive
 F. Breathing:   Observed                   Score:          [DATE]
OB History

 Gravidity:    3         Term:   1        Prem:   0        SAB:   0
 TOP:          1       Ectopic:  0        Living: 1
Gestational Age

 LMP:           32w 5d        Date:  08/26/20                 EDD:   06/02/21
 Best:          32w 5d     Det. By:  LMP  (08/26/20)          EDD:   06/02/21
Impression

 Antenatal testing is reassuring with BPP [DATE].  Normal
 amniotic fluid volume.
Recommendations

 Continue weekly antenatal testing till delivery .
              Tarla, Bambucafe

## 2023-01-25 ENCOUNTER — Other Ambulatory Visit: Payer: Self-pay | Admitting: Advanced Practice Midwife

## 2023-01-25 ENCOUNTER — Encounter: Payer: Self-pay | Admitting: Women's Health

## 2023-01-25 MED ORDER — MEGESTROL ACETATE 40 MG PO TABS: ORAL_TABLET | ORAL | 3 refills | Status: DC

## 2023-07-19 ENCOUNTER — Encounter: Payer: Self-pay | Admitting: Adult Health

## 2023-07-19 ENCOUNTER — Ambulatory Visit (INDEPENDENT_AMBULATORY_CARE_PROVIDER_SITE_OTHER): Payer: Medicaid Other | Admitting: Adult Health

## 2023-07-19 ENCOUNTER — Other Ambulatory Visit (HOSPITAL_COMMUNITY)
Admission: RE | Admit: 2023-07-19 | Discharge: 2023-07-19 | Disposition: A | Discharge status: Home / Self Care | Payer: Medicaid Other | Source: Ambulatory Visit | Attending: Adult Health | Admitting: Adult Health

## 2023-07-19 VITALS — BP 120/84 | HR 78 | Ht 62.0 in | Wt 146.5 lb

## 2023-07-19 DIAGNOSIS — Z0000 Encounter for general adult medical examination without abnormal findings: Secondary | ICD-10-CM | POA: Insufficient documentation

## 2023-07-19 DIAGNOSIS — Z01419 Encounter for gynecological examination (general) (routine) without abnormal findings: Secondary | ICD-10-CM | POA: Insufficient documentation

## 2023-07-19 DIAGNOSIS — Z1331 Encounter for screening for depression: Secondary | ICD-10-CM | POA: Diagnosis not present

## 2023-07-19 DIAGNOSIS — N926 Irregular menstruation, unspecified: Secondary | ICD-10-CM | POA: Diagnosis not present

## 2023-07-19 DIAGNOSIS — Z975 Presence of (intrauterine) contraceptive device: Secondary | ICD-10-CM | POA: Insufficient documentation

## 2023-07-19 MED ORDER — MEGESTROL ACETATE 40 MG PO TABS: ORAL_TABLET | ORAL | 3 refills | Status: DC

## 2023-07-19 NOTE — Progress Notes (Signed)
Patient ID: Alicia [name], female   DOB: 11-Dec-1990, 32 y.o.   MRN: [id number] History of Present Illness: Alicia [name] is a 32 year old black female, single, [id number] in for a well woman gyn exam and pap. She is having a bleeding about every 3 weeks with nexplanon.   PCP is in [place]   Current Medications, Allergies, Past Medical History, Past Surgical History, Family History and Social History were reviewed in [employer] record.     Review of Systems: Patient denies any headaches, hearing loss, fatigue, blurred vision, shortness of breath, chest pain, abdominal pain, problems with bowel movements, urination, or intercourse(no sex in over a year). No joint pain or mood swings.  Bleeding every 3 weeks with nexplanon   Physical Exam:BP 120/84 (BP Location: Left Arm, Patient Position: Sitting, Cuff Size: Normal)   Pulse 78   Ht 5\' 2"  (1.575 m)   Wt 146 lb 8 oz (66.5 kg)   LMP 07/15/2023   BMI 26.80 kg/m   General:  Well developed, well nourished, no acute distress Skin:  Warm and dry Neck:  Midline trachea, normal thyroid, good ROM, no lymphadenopathy Lungs; Clear to auscultation bilaterally Breast:  No dominant palpable mass, retraction, or nipple discharge Cardiovascular: Regular rate and rhythm Abdomen:  Soft, non tender, no hepatosplenomegaly Pelvic:  External genitalia is normal in appearance, no lesions.  The vagina is normal in appearance,+blood. Urethra has no lesions or masses. The cervix is bulbous.Pap with GC/CHL and HR HPV genotyping performed.  Uterus is felt to be normal size, shape, and contour.  No adnexal masses or tenderness noted.Bladder is non tender, no masses felt. Extremities/musculoskeletal:  No swelling or varicosities noted, no clubbing or cyanosis Psych:  No mood changes, alert and cooperative,seems happy AA is 3 Fall risk is low    07/19/2023    2:12 PM 04/12/2021   11:37 AM 03/09/2021    9:58 AM  Depression screen PHQ 2/9   Decreased Interest 0 0 0  Down, Depressed, Hopeless 0 0 0  PHQ - 2 Score 0 0 0  Altered sleeping 0 0 0  Tired, decreased energy 0 0 0  Change in appetite 0 0 0  Feeling bad or failure about yourself  0 0 0  Trouble concentrating 0 0 0  Moving slowly or fidgety/restless 0 0 0  Suicidal thoughts 0 0 0  PHQ-9 Score 0 0 0       07/19/2023    2:12 PM 04/12/2021   11:37 AM 03/09/2021    9:58 AM 12/08/2020   11:42 AM  GAD 7 : Generalized Anxiety Score  Nervous, Anxious, on Edge 0 0 0 0  Control/stop worrying 0 0 0 0  Worry too much - different things 0 0 0 1  Trouble relaxing 0 0 0 0  Restless 0 0 0 0  Easily annoyed or irritable 1 0 0 0  Afraid - awful might happen 0 0 0 0  Total GAD 7 Score 1 0 0 1      Upstream - 07/19/23 1422       Pregnancy Intention Screening   Does the patient want to become pregnant in the next year? No    Does the patient's partner want to become pregnant in the next year? No    Would the patient like to discuss contraceptive options today? No      Contraception Wrap Up   Current Method Hormonal Implant    End Method Hormonal Implant  Contraception Counseling Provided Yes            Examination chaperoned by Malachy Mood LPN   Impression and Plan: 1. Routine general medical examination at a health care facility Pap sent Pap in 3 years if normal Physical in 1 year - Cytology - PAP( [place])  2. Nexplanon in place Placed 06/23/21  3. Irregular bleeding Bleeding about every 3 weeks with nexplanon  Will rx megace Meds ordered this encounter  Medications   megestrol (MEGACE) 40 MG tablet    Sig: Take 1- 2 daily to stop bleeding    Dispense:  60 tablet    Refill:  3    Order Specific Question:   Supervising Provider    Answer:   Despina Hidden, LUTHER H [[id number]]     4. Encounter for gynecological examination with Papanicolaou smear of cervix Pap sent

## 2023-07-24 LAB — CYTOLOGY - PAP
Chlamydia: NEGATIVE
Comment: NEGATIVE
Comment: NEGATIVE
Comment: NORMAL
Diagnosis: NEGATIVE
High risk HPV: NEGATIVE
Neisseria Gonorrhea: NEGATIVE

## 2024-04-01 ENCOUNTER — Encounter (HOSPITAL_COMMUNITY): Payer: Self-pay

## 2024-04-01 ENCOUNTER — Emergency Department (HOSPITAL_COMMUNITY)
Admission: EM | Admit: 2024-04-01 | Discharge: 2024-04-01 | Disposition: A | Discharge status: Home / Self Care | Attending: Emergency Medicine | Admitting: Emergency Medicine

## 2024-04-01 ENCOUNTER — Other Ambulatory Visit: Payer: Self-pay

## 2024-04-01 ENCOUNTER — Emergency Department (HOSPITAL_COMMUNITY)

## 2024-04-01 DIAGNOSIS — M25572 Pain in left ankle and joints of left foot: Secondary | ICD-10-CM | POA: Insufficient documentation

## 2024-04-01 MED ORDER — NAPROXEN 250 MG PO TABS
500.0000 mg | ORAL_TABLET | Freq: Once | ORAL | Status: AC
  Administered 2024-04-01: 500 mg via ORAL
  Filled 2024-04-01: qty 2

## 2024-04-01 MED ORDER — NAPROXEN 500 MG PO TABS: 500.0000 mg | ORAL_TABLET | Freq: Two times a day (BID) | ORAL | 0 refills | Status: DC

## 2024-04-01 NOTE — ED Notes (Signed)
 Portable Xray at bedside.

## 2024-04-01 NOTE — Discharge Instructions (Signed)
 It was a pleasure taking care of you today.  You were evaluated in the ER for left ankle pain and swelling.  Your results showed no abnormalities on the x-ray.  Your symptoms are likely related to overuse.  Wear the Aircast, use crutches as needed, keep your foot elevated and take the naproxen  for pain and inflammation.  Make sure you take this with food.  He can also use over-the-counter Tylenol  as directed on packaging as needed for discomfort.  Follow-up with PCP and/orthopedics.  Come back to the ER for any new or worsening symptoms such as fever, severe pain, redness and warmth or any other complaints.  It is important to follow-up closely with your primary care doctor and/or any specialist as discussed.  Come back to the ER if you have any new or worsening symptoms.

## 2024-04-01 NOTE — ED Provider Notes (Cosign Needed Addendum)
 [place] EMERGENCY DEPARTMENT AT [facility] Provider Note   CSN: [id number] Arrival date & time: 04/01/24  4098     History  Chief Complaint  Patient presents with   Ankle Pain    Alicia [name] is a 33 y.o. female. She is c/o left ankle pain for the past several months since starting a job that requires long periods of standing.  She has used a brace and ice.  She gets temporary relief from ice but when she goes back to standing she has pain.  She been wearing well cushioned shoes without relief as well.  There has been no injury or trauma, no fever or chills.  She noticed that the swelling was somewhat more which is what prompted her emergency department visit today.  Is not taking any over-the-counter medications.  No chance pregnancy.   Ankle Pain      Home Medications Prior to Admission medications   Medication Sig Start Date End Date Taking? Authorizing Provider  etonogestrel  (NEXPLANON ) 68 MG IMPL implant 1 each by Subdermal route once.    [provider]  megestrol  (MEGACE ) 40 MG tablet Take 1- 2 daily to stop bleeding 07/19/23   Lendia Quay A, NP  UNABLE TO FIND Weight gain supp-BID    [provider]      Allergies    Penicillins and Sulfa  antibiotics    Review of Systems   Review of Systems  Physical Exam Updated Vital Signs BP 100/85   Pulse 82   Temp 98.1 F (36.7 C) (Oral)   Resp 17   Ht 5\' 2"  (1.575 m)   Wt 61.2 kg   SpO2 95%   BMI 24.69 kg/m  Physical Exam Vitals and nursing note reviewed.  Constitutional:      General: She is not in acute distress.    Appearance: She is well-developed.  HENT:     Head: Normocephalic and atraumatic.     Mouth/Throat:     Mouth: Mucous membranes are moist.  Eyes:     Extraocular Movements: Extraocular movements intact.     Conjunctiva/sclera: Conjunctivae normal.  Cardiovascular:     Rate and Rhythm: Normal rate and regular rhythm.     Heart sounds: No murmur  heard. Pulmonary:     Effort: Pulmonary effort is normal. No respiratory distress.     Breath sounds: Normal breath sounds.  Abdominal:     Palpations: Abdomen is soft.     Tenderness: There is no abdominal tenderness.  Musculoskeletal:        General: Swelling and tenderness present.     Cervical back: Neck supple.     Comments: DP pulses intact bilaterally 2+, sensation intact in bilateral feet to light touch.  Left ankle is swollen with nonpitting edema over BL malleolus.  This is the area of maximal tenderness.  Patient can bear weight and ambulate, no tenderness over the Achilles, normal range of motion of the joint though inversion and eversion are painful.  Skin:    General: Skin is warm and dry.     Capillary Refill: Capillary refill takes less than 2 seconds.     Findings: No erythema or rash.  Neurological:     General: No focal deficit present.     Mental Status: She is alert and oriented to person, place, and time.  Psychiatric:        Mood and Affect: Mood normal.     ED Results / Procedures / Treatments  Labs (all labs ordered are listed, but only abnormal results are displayed) Labs Reviewed - No data to display  EKG None  Radiology DG Ankle Complete Left Result Date: 04/01/2024 CLINICAL DATA:  Left ankle burning and throbbing for the past month. EXAM: LEFT ANKLE COMPLETE - 3+ VIEW COMPARISON:  December 01, 2019 FINDINGS: There is no evidence of fracture, dislocation, or joint effusion. Mild degenerative changes are seen along the dorsal aspect of the proximal left foot. Mild diffuse soft tissue swelling is noted. IMPRESSION: Mild diffuse soft tissue swelling without evidence of acute osseous abnormality. Electronically Signed   By: Virgle Grime M.D.   On: 04/01/2024 09:06    Procedures Procedures    Medications Ordered in ED Medications  naproxen  (NAPROSYN ) tablet 500 mg (has no administration in time range)    ED Course/ Medical Decision Making/ A&P                                  Medical Decision Making Differential diagnosis includes but limited to fracture, sprain, arthritis, gout, cellulitis, tendinitis, other  ED course: Patient presents with several months of nontraumatic left ankle swelling, she now having increased swelling over the medial malleolus.  She has a job that requires prolonged standing and this is what worsens her symptoms.  She reasonable morning with decreased pain and it gets worse as she stands on her foot.  She has tried an compression brace and ice.  This helps temporarily but since she takes the ice off she states that becomes painful again.  X-ray shows intact mortise, no fracture or malalignment, soft tissue swelling is noted.  I agree with radiology read.  No capsular tenderness to suggest VTE  Discussed with patient this is likely an overuse injury.  Will give her an Aircast, crutches, have her limit weightbearing, start NSAIDs help with the swelling and discomfort and have her follow-up closely with orthopedics.  Will be given a work note today as well.  She was advised to come back for new or worsening symptoms.  Amount and/or Complexity of Data Reviewed Radiology: ordered and independent interpretation performed.  Risk Prescription drug management.           Final Clinical Impression(s) / ED Diagnoses Final diagnoses:  None    Rx / DC Orders ED Discharge Orders     None         Aimee Houseman, PA-C 04/01/24 0935    Aimee Houseman, PA-C 04/01/24 1610    Teddi Favors, DO 04/02/24 [phone]

## 2024-04-01 NOTE — ED Triage Notes (Signed)
 Patient come in POV for complaint of pain described as burning and throbbing to Left ankle, stated has been going on for the past month.has hurt it a long time ago, but believes it to have been triggered from walking concrete for long periods at work.

## 2024-04-08 ENCOUNTER — Encounter: Payer: Self-pay | Admitting: Orthopedic Surgery

## 2024-04-08 ENCOUNTER — Ambulatory Visit: Admitting: Orthopedic Surgery

## 2024-04-08 VITALS — BP 100/85 | Ht 62.0 in | Wt 135.0 lb

## 2024-04-08 DIAGNOSIS — M76822 Posterior tibial tendinitis, left leg: Secondary | ICD-10-CM

## 2024-04-08 MED ORDER — NAPROXEN 500 MG PO TABS: 500.0000 mg | ORAL_TABLET | Freq: Two times a day (BID) | ORAL | 1 refills | Status: AC

## 2024-04-08 NOTE — Progress Notes (Signed)
  Intake history:  BP 100/85 Comment: 04/01/24  Ht 5' 2 (1.575 m)   Wt 135 lb (61.2 kg)   BMI 24.69 kg/m  Body mass index is 24.69 kg/m.    WHAT ARE WE SEEING YOU FOR TODAY?   left ankle(s)  How long has this bothered you? (DOI?DOS?WS?)  About a year(s) ago pain started, works on concrete floors increases pain  Anticoag.  No  Diabetes No  Heart disease No  Hypertension No  SMOKING HX No  Kidney disease No  Any ALLERGIES ______________________________________________   Treatment:  Have you taken:  Tylenol  No  Advil  No  Had PT No  Had injection No  Other  ___________naproxen and cam walker from ER______________

## 2024-04-08 NOTE — Progress Notes (Signed)
 Office Visit Note   Patient: Alicia [name]           Date of Birth: 01/26/1991           MRN: [id number] Visit Date: 04/08/2024 Requested by: Orlena Bitters, DO [address] [place],  [place] [address] PCP: Lazoff, Shawn P, DO   Assessment & Plan:   Encounter Diagnosis  Name Primary?  . Insufficiency of left posterior tibial tendon Yes    Meds ordered this encounter  Medications  . naproxen  (NAPROSYN ) 500 MG tablet    Sig: Take 1 tablet (500 mg total) by mouth 2 (two) times daily.    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 Ankle Pain    Left     HPI: 1 year history left ankle pain no injury.  Also has pain posterior tibia Lateral pain sinus Tarsi Swelling both sides of the ankle joint              ROS: Negative   Images personally read and my interpretation :  In the course of treatment we did read a left ankle x-ray which was negative for fracture no malalignment was  Visit Diagnoses:  1. Insufficiency of left posterior tibial tendon      Follow-Up Instructions: Return in about 6 weeks (around 05/20/2024) for FOLLOW UP PTTD.    Objective: Vital Signs: BP 100/85 Comment: 04/01/24  Ht 5' 2 (1.575 m)   Wt 135 lb (61.2 kg)   BMI 24.69 kg/m   Physical Exam Vitals and nursing note reviewed.  Constitutional:      Appearance: Normal appearance.  HENT:     Head: Normocephalic and atraumatic.   Eyes:     General: No scleral icterus.       Right eye: No discharge.        Left eye: No discharge.     Extraocular Movements: Extraocular movements intact.     Conjunctiva/sclera: Conjunctivae normal.     Pupils: Pupils are equal, round, and reactive to light.    Cardiovascular:     Rate and Rhythm: Normal rate.     Pulses: Normal pulses.   Skin:    General: Skin is warm and dry.     Capillary Refill:  Capillary refill takes less than 2 seconds.   Neurological:     General: No focal deficit present.     Mental Status: She is alert and oriented to person, place, and time.   Psychiatric:        Mood and Affect: Mood normal.        Behavior: Behavior normal.        Thought Content: Thought content normal.        Judgment: Judgment normal.    Left Ankle Exam   Comments:  Tenderness at the posterior tibial tendon around the medial malleolus and then some mild pain on the posterior part of the shin  Sinus tarsi tenderness with stable ankle ligaments normal range of motion  Tiptoe test was positive for weakness in the posterior tibial tendon     Specialty Comments:  No specialty comments available.  Imaging: No results found.   PMFS History: Patient Active Problem List   Diagnosis Date Noted  . Irregular bleeding 07/19/2023  . Nexplanon  in place 07/19/2023  .  Routine general medical examination at a health care facility 07/19/2023  . Encounter for gynecological examination with Papanicolaou smear of cervix 07/19/2023  . Nexplanon  insertion 06/23/2021  . Abnormal chromsoml and genetic finding on antenatal screen of mother 12/21/2020  . Trichomonas infection 12/09/2020  . Cholelithiases 12/08/2020  . HSV-2 infection   . History of cholestasis during pregnancy 11/11/2020  . Elevated LFTs 11/11/2020  . Marijuana use 10/15/2013   Past Medical History:  Diagnosis Date  . Anemia   . Blood in urine 12/23/2015  . Cholestasis during pregnancy   . Contraceptive management 01/22/2015  . Heavy menstrual period 04/28/2014  . HSV-2 infection   . Urinary frequency 12/23/2015  . Urinary tract infection   . Vaginal discharge 12/31/2014  . Vaginal itching 12/31/2014  . Vaginal odor 12/03/2015  . Yeast infection 12/31/2014    Family History  Problem Relation Age of Onset  . Heart attack Paternal Grandfather   . Cancer Maternal Grandmother        breast  . Hypertension Father    . Diabetes Father   . Hypertension Mother   . Anemia Mother   . Hypertension Paternal Aunt     Past Surgical History:  Procedure Laterality Date  . CHOLECYSTECTOMY N/A 12/16/2020   Procedure: LAPAROSCOPIC CHOLECYSTECTOMY;  Surgeon: Alanda Allegra, MD;  Location: [facility];  Service: General;  Laterality: N/A;  . DILATION AND CURETTAGE OF UTERUS N/A 06/19/2015   Procedure: SUCTION AND SHARP UTERINE CURETTAGE;  Surgeon: Wendelyn Halter, MD;  Location: [facility];  Service: Gynecology;  Laterality: N/A;  . LIPOMA EXCISION  07/2016   left eye  . NO PAST SURGERIES     Social History   Occupational History  . Not on file  Tobacco Use  . Smoking status: Former    Current packs/day: 0.50    Average packs/day: 0.5 packs/day for 2.0 years (1.0 ttl pk-yrs)    Types: Cigarettes  . Smokeless tobacco: Never  Vaping Use  . Vaping status: Some Days  Substance and Sexual Activity  . Alcohol use: Yes  . Drug use: Yes    Types: Marijuana    Comment: BID  . Sexual activity: Not Currently    Birth control/protection: Implant

## 2024-04-08 NOTE — Patient Instructions (Signed)
 Physical therapy has been ordered for you at [facility]. They should call you to schedule, [phone] is the phone number to call, if you want to call to schedule.

## 2024-04-09 ENCOUNTER — Telehealth: Payer: Self-pay | Admitting: Orthopedic Surgery

## 2024-04-09 NOTE — Telephone Encounter (Signed)
 Dr. Delfino Fellers - spoke w/the pt, she stated she was seen yesterday and that Dr. Phyllis Breeze told her she could get a note lifting restrictions.  I do not see anything about a work note in the AVS, please advise.  [phone]

## 2024-04-10 NOTE — Telephone Encounter (Signed)
 Spoke w/the pt, she stated she can wear the cam walker at work, but her note has to be specific in regards to light duty or not and if light duty, needs that broken down for the note.

## 2024-04-10 NOTE — Telephone Encounter (Signed)
 She needs note to wear cam walker at work until she follows up with us  again please

## 2024-04-10 NOTE — Telephone Encounter (Signed)
 I called her what does she want on the note  She said ok to have note for cam walker use that would be fine  Thanks.

## 2024-04-25 NOTE — Therapy (Signed)
 OUTPATIENT PHYSICAL THERAPY LOWER EXTREMITY EVALUATION   Patient Name: Alicia [name] MRN: [id number] DOB:06-Aug-1991, 33 y.o., female Today's Date: 04/29/2024  END OF SESSION:  PT End of Session - 04/29/24 1513     Visit Number 1    Number of Visits 12    Date for PT Re-Evaluation 05/20/24    Authorization Type [place] [employer]    Authorization Time Period Auth requested    PT Start Time 1515    PT Stop Time 1559    PT Time Calculation (min) 44 min    Activity Tolerance Patient tolerated treatment well    Behavior During Therapy [facility] for tasks assessed/performed          Past Medical History:  Diagnosis Date   Anemia    Blood in urine 12/23/2015   Cholestasis during pregnancy    Contraceptive management 01/22/2015   Heavy menstrual period 04/28/2014   HSV-2 infection    Urinary frequency 12/23/2015   Urinary tract infection    Vaginal discharge 12/31/2014   Vaginal itching 12/31/2014   Vaginal odor 12/03/2015   Yeast infection 12/31/2014   Past Surgical History:  Procedure Laterality Date   CHOLECYSTECTOMY N/A 12/16/2020   Procedure: LAPAROSCOPIC CHOLECYSTECTOMY;  Surgeon: Mavis Anes, MD;  Location: [facility];  Service: General;  Laterality: N/A;   DILATION AND CURETTAGE OF UTERUS N/A 06/19/2015   Procedure: SUCTION AND SHARP UTERINE CURETTAGE;  Surgeon: Vonn VEAR Inch, MD;  Location: [facility];  Service: Gynecology;  Laterality: N/A;   LIPOMA EXCISION  07/2016   left eye   NO PAST SURGERIES     Patient Active Problem List   Diagnosis Date Noted   Irregular bleeding 07/19/2023   Nexplanon  in place 07/19/2023   Routine general medical examination at a health care facility 07/19/2023   Encounter for gynecological examination with Papanicolaou smear of cervix 07/19/2023   Nexplanon  insertion 06/23/2021   Abnormal chromsoml and genetic finding on antenatal screen of mother 12/21/2020   Trichomonas infection 12/09/2020   Cholelithiases 12/08/2020   HSV-2  infection    History of cholestasis during pregnancy 11/11/2020   Elevated LFTs 11/11/2020   Marijuana use 10/15/2013    PCP: Lazoff, Shawn P, DO  REFERRING PROVIDER: Margrette Taft BRAVO, MD  REFERRING DIAG: [phone] (ICD-10-CM) - Insufficiency of left posterior tibial tendon  THERAPY DIAG:  Stiffness of left ankle, not elsewhere classified  Muscle weakness (generalized)  Impaired functional mobility, balance, gait, and endurance  Rationale for Evaluation and Treatment: Rehabilitation  ONSET DATE: About a year  SUBJECTIVE:   SUBJECTIVE STATEMENT: Patient reports she sprained this ankle a couple years ago. But nothing since then. Reports no inciting incident to cause this current pain. Reports she's been working on concrete floors since age 10 years. CAM boot on at arrival of session, reports its helped with swelling, but not with pain much. Reports MD wants her to wear it 6 weeks. At time of Eval, its been 3 weeks with boot.  PERTINENT HISTORY: Sprained L ankle years ago PAIN:  Are you having pain? Yes: NPRS scale: 6/10 Pain location: Front of ankle, calf intermittently,  Pain description: Shooting, Stabbing, burning Aggravating factors:  Walking at work, PF  Relieving factors: Ice but doesn't last long  PRECAUTIONS: None  RED FLAGS: None   WEIGHT BEARING RESTRICTIONS: No  FALLS:  Has patient fallen in last 6 months? No  LIVING ENVIRONMENT: Stairs: Yes: External: 5 steps; none Has following equipment at home: Crutches, has not  used   OCCUPATION: [employer], 10-11 hour shifts  PLOF: Independent  PATIENT GOALS: For pain to stop and swelling to decrease, wants to be able to take boot off and not have any pain   NEXT MD VISIT: July 22nd, 2025  OBJECTIVE:  Note: Objective measures were completed at Evaluation unless otherwise noted.  DIAGNOSTIC FINDINGS:  CLINICAL DATA:  Left ankle burning and throbbing for the past month.   EXAM: LEFT ANKLE COMPLETE  - 3+ VIEW   COMPARISON:  December 01, 2019   FINDINGS: There is no evidence of fracture, dislocation, or joint effusion. Mild degenerative changes are seen along the dorsal aspect of the proximal left foot. Mild diffuse soft tissue swelling is noted.   IMPRESSION: Mild diffuse soft tissue swelling without evidence of acute osseous abnormality.    PATIENT SURVEYS:  Lower Extremity Functional Score: 49 / 80 = 61.3 %  COGNITION: Overall cognitive status: Within functional limits for tasks assessed     SENSATION: Light touch: Impaired  Dec on L L5 dermatome   EDEMA:  Figure 8: Equal on both sides  MUSCLE LENGTH: Hamstrings: inc tightness in L compared to R during 90/90 test   POSTURE: No Significant postural limitations  PALPATION: TTP anterior aspect of L ankle, around medial and lateral malleoli on L, TTP on distal achilles tendon on L (tightness noted)   LOWER EXTREMITY ROM:  Active ROM Right eval Left eval  Hip flexion    Hip extension    Hip abduction    Hip adduction    Hip internal rotation    Hip external rotation    Knee flexion WNL WNL  Knee extension WNL WNL  Ankle dorsiflexion WNL 10  Ankle plantarflexion WNL 25  Ankle inversion WNL 10  Ankle eversion WNL 15   (Blank rows = not tested)     LOWER EXTREMITY MMT:  MMT Right eval Left eval  Hip flexion    Hip extension    Hip abduction    Hip adduction    Hip internal rotation    Hip external rotation    Knee flexion    Knee extension    Ankle dorsiflexion 5 4  Ankle plantarflexion 5 4+ *  Ankle inversion    Ankle eversion     (Blank rows = not tested)   *=painful  LOWER EXTREMITY SPECIAL TESTS:  Ankle special tests: Anterior drawer test: negative for excessive movement but, positive for pain  FUNCTIONAL TESTS:  2 minute walk test: 363' CAM boot donned  GAIT: Distance walked: 9' Assistive device utilized: CAM boot  Level of assistance: Modified independence Comments: Dec weight  shift/stance time onto LLE, slightly dec velocity, reports slight R knee pain due to favoring RLE                                                                                                                                TREATMENT DATE:  04/29/24/:  PT eval and HEP    PATIENT EDUCATION:  Education details: PT evaluation, objective findings, POC, Importance of HEP, Precautions, Clinic policies Person educated: Patient Education method: Explanation and Demonstration Education comprehension: verbalized understanding and returned demonstration  HOME EXERCISE PROGRAM: Access Code: [email or web address] URL: [email or web address] Date: 04/29/2024 Prepared by: Rosaria Powell-Butler  Exercises - Supine Hamstring Stretch with Strap  - 2 x daily - 7 x weekly - 3 sets - 30 hold - Seated Gastroc Stretch with Strap  - 2 x daily - 7 x weekly - 3 sets - 15 hold - Seated Heel Raise  - 2 x daily - 7 x weekly - 3 sets - 10 reps - Seated Heel Toe Raises  - 2 x daily - 7 x weekly - 3 sets - 10 reps  ASSESSMENT:  CLINICAL IMPRESSION: Patient is a 33 y.o. female who was seen today for physical therapy evaluation and treatment for F23.177 (ICD-10-CM) - Insufficiency of left posterior tibial tendon. Patient arrives to PT evaluation with CAM boot donned. Patient demonstrates decreased L ankle ROM, dec L ankle strength, altered gait, pain with palpation, and pain during functional activities. Patient with negative anterior drawer test with no excessive motion but reports increased pain during. Also note of mild swelling in sinus tarsi area as well as around lateral malleoli. Patient has been in the boot for about 3 weeks, and was instructed to continue wear for at least 3 more weeks (6 weeks total) by MD. Patient educated on importance of supportive shoes while at work, shoes with better arch support. Patient will benefit from continued skilled physical therapy in order to address the above to reduce pain and  improve function/QOL.    OBJECTIVE IMPAIRMENTS: Abnormal gait, decreased activity tolerance, decreased endurance, decreased mobility, difficulty walking, decreased ROM, decreased strength, improper body mechanics, and pain.   ACTIVITY LIMITATIONS: carrying, lifting, bending, sitting, standing, squatting, stairs, and transfers  PARTICIPATION LIMITATIONS: meal prep, cleaning, laundry, community activity, and occupation  PERSONAL FACTORS: N/A are also affecting patient's functional outcome.   REHAB POTENTIAL: Good  CLINICAL DECISION MAKING: Stable/uncomplicated  EVALUATION COMPLEXITY: Low   GOALS: Goals reviewed with patient? No  SHORT TERM GOALS: Target date: 05/13/24 Patient will be independent with performance of HEP to demonstrate adequate self management of symptoms.  Baseline:  Goal status: INITIAL  2.   Patient will report at least a 25% improvement with function or pain overall since beginning PT. Baseline:  Goal status: INITIAL   LONG TERM GOALS: Target date: 06/10/24 Patient will improve LEFS score by 9 points to demonstrate improved perceived function while meeting MCID.  Baseline: Goal status: INITIAL 2.  Patient will improve L ankle DF ROM to at least 20 degrees to show improved LE mobility for improve functional transfers and QOL.  Baseline:  Goal status: INITIAL 3.  Patient will improve L ankle PF ROM to at least 50 degrees to show improved LE mobility for improve functional transfers and QOL. Baseline:  Goal status: INITIAL   4.  Patient will increase gait distance to at least 400 ft without CAM boot to demonstrate improved functional mobility walking household and community distances. Baseline: Goal status: INITIAL 5. Patient will report pain rating as 3/10 or below following a day at work to demonstrate improved activity tolerance.  Baseline: Goal status: INITIAL   PLAN:  PT FREQUENCY: 2x/week  PT DURATION: 6 weeks  PLANNED INTERVENTIONS: 97164- PT  Re-evaluation, 97110-Therapeutic exercises, 97530- Therapeutic activity, [id number]- Neuromuscular re-education, 97535- Self Care, 02859-  Manual therapy, [id number]- Gait training, 02967- Electrical stimulation (manual), [id number]- Traction (mechanical), [phone] (1-2 muscles), 20561 (3+ muscles)- Dry Needling, Patient/Family education, Balance training, Stair training, Taping, Joint mobilization, Spinal mobilization, Cryotherapy, and Moist heat  PLAN FOR NEXT SESSION: Review HEP and goals, assess balance (static and/or dynamic), possible taping of L ankle for stability, cont to progress L ankle ROM and strength   5:25 PM, 04/29/24 Keandrea Tapley Powell-Butler, PT, DPT [facility] [place]    Managed Medicaid Authorization Request  Visit Dx Codes:  F74.327 M62.81 Z74.09  Functional Tool Score:  Lower Extremity Functional Score: 49 / 80 = 61.3 % 2 minute walk test: 363' CAM boot donned  For all possible CPT codes, reference the Planned Interventions line above.     Check all conditions that are expected to impact treatment: {Conditions expected to impact treatment:None of these apply   If treatment provided at initial evaluation, no treatment charged due to lack of authorization.

## 2024-04-29 ENCOUNTER — Encounter (HOSPITAL_COMMUNITY): Payer: Self-pay

## 2024-04-29 ENCOUNTER — Other Ambulatory Visit: Payer: Self-pay

## 2024-04-29 ENCOUNTER — Ambulatory Visit (HOSPITAL_COMMUNITY): Attending: Orthopedic Surgery

## 2024-04-29 DIAGNOSIS — Z7409 Other reduced mobility: Secondary | ICD-10-CM | POA: Diagnosis present

## 2024-04-29 DIAGNOSIS — M76822 Posterior tibial tendinitis, left leg: Secondary | ICD-10-CM | POA: Diagnosis not present

## 2024-04-29 DIAGNOSIS — M6281 Muscle weakness (generalized): Secondary | ICD-10-CM | POA: Insufficient documentation

## 2024-04-29 DIAGNOSIS — M25672 Stiffness of left ankle, not elsewhere classified: Secondary | ICD-10-CM | POA: Insufficient documentation

## 2024-05-01 ENCOUNTER — Encounter (HOSPITAL_COMMUNITY): Payer: Self-pay

## 2024-05-01 ENCOUNTER — Ambulatory Visit (HOSPITAL_COMMUNITY)

## 2024-05-01 DIAGNOSIS — M25672 Stiffness of left ankle, not elsewhere classified: Secondary | ICD-10-CM

## 2024-05-01 DIAGNOSIS — Z7409 Other reduced mobility: Secondary | ICD-10-CM

## 2024-05-01 DIAGNOSIS — M6281 Muscle weakness (generalized): Secondary | ICD-10-CM

## 2024-05-01 NOTE — Therapy (Signed)
 OUTPATIENT PHYSICAL THERAPY LOWER EXTREMITY EVALUATION   Patient Name: Alicia [name] MRN: [id number] DOB:[birth year], 33 y.o., female Today's Date: 05/01/2024  END OF SESSION:  PT End of Session - 05/01/24 1019     Visit Number 2    Number of Visits 12    Date for PT Re-Evaluation 05/20/24    Authorization Type [place] Medicaid [employer] Blue    Authorization Time Period 6 visits approved    Authorization - Visit Number 1    PT Start Time 1017    PT Stop Time 1057    PT Time Calculation (min) 40 min    Activity Tolerance Patient tolerated treatment well    Behavior During Therapy WFL for tasks assessed/performed           Past Medical History:  Diagnosis Date   Anemia    Blood in urine 12/23/2015   Cholestasis during pregnancy    Contraceptive management 01/22/2015   Heavy menstrual period 04/28/2014   HSV-2 infection    Urinary frequency 12/23/2015   Urinary tract infection    Vaginal discharge 12/31/2014   Vaginal itching 12/31/2014   Vaginal odor 12/03/2015   Yeast infection 12/31/2014   Past Surgical History:  Procedure Laterality Date   CHOLECYSTECTOMY N/A 12/16/2020   Procedure: LAPAROSCOPIC CHOLECYSTECTOMY;  Surgeon: Mavis Anes, MD;  Location: [facility];  Service: General;  Laterality: N/A;   DILATION AND CURETTAGE OF UTERUS N/A 06/19/2015   Procedure: SUCTION AND SHARP UTERINE CURETTAGE;  Surgeon: Vonn VEAR Inch, MD;  Location: [facility];  Service: Gynecology;  Laterality: N/A;   LIPOMA EXCISION  07/2016   left eye   NO PAST SURGERIES     Patient Active Problem List   Diagnosis Date Noted   Irregular bleeding 07/19/2023   Nexplanon  in place 07/19/2023   Routine general medical examination at a health care facility 07/19/2023   Encounter for gynecological examination with Papanicolaou smear of cervix 07/19/2023   Nexplanon  insertion 06/23/2021   Abnormal chromsoml and genetic finding on antenatal screen of mother 12/21/2020   Trichomonas infection 12/09/2020    Cholelithiases 12/08/2020   HSV-2 infection    History of cholestasis during pregnancy 11/11/2020   Elevated LFTs 11/11/2020   Marijuana use 10/15/2013    PCP: Lazoff, Shawn P, DO  REFERRING PROVIDER: Margrette Taft BRAVO, MD  REFERRING DIAG: [phone] (ICD-10-CM) - Insufficiency of left posterior tibial tendon  THERAPY DIAG:  Stiffness of left ankle, not elsewhere classified  Muscle weakness (generalized)  Impaired functional mobility, balance, gait, and endurance  Rationale for Evaluation and Treatment: Rehabilitation  ONSET DATE: About a year  SUBJECTIVE:   SUBJECTIVE STATEMENT: Pt states pain is at a 5/10. Pt states she had a busy work day, pt states it is still swelling some. Pt states she just wants to get back to running with her kids at home.  Patient reports she sprained this ankle a couple years ago. But nothing since then. Reports no inciting incident to cause this current pain. Reports she's been working on concrete floors since age 18 years. CAM boot on at arrival of session, reports its helped with swelling, but not with pain much. Reports MD wants her to wear it 6 weeks. At time of Eval, its been 3 weeks with boot.  PERTINENT HISTORY: Sprained L ankle years ago PAIN:  Are you having pain? Yes: NPRS scale: 6/10 Pain location: Front of ankle, calf intermittently,  Pain description: Shooting, Stabbing, burning Aggravating factors:  Walking at work, PF  Relieving factors: Ice but doesn't last long  PRECAUTIONS: None  RED FLAGS: None   WEIGHT BEARING RESTRICTIONS: No  FALLS:  Has patient fallen in last 6 months? No  LIVING ENVIRONMENT: Stairs: Yes: External: 5 steps; none Has following equipment at home: Crutches, has not used   OCCUPATION: [employer], 10-11 hour shifts  PLOF: Independent  PATIENT GOALS: For pain to stop and swelling to decrease, wants to be able to take boot off and not have any pain   NEXT MD VISIT: July 22nd,  2025  OBJECTIVE:  Note: Objective measures were completed at Evaluation unless otherwise noted.  DIAGNOSTIC FINDINGS:  CLINICAL DATA:  Left ankle burning and throbbing for the past month.   EXAM: LEFT ANKLE COMPLETE - 3+ VIEW   COMPARISON:  December 01, 2019   FINDINGS: There is no evidence of fracture, dislocation, or joint effusion. Mild degenerative changes are seen along the dorsal aspect of the proximal left foot. Mild diffuse soft tissue swelling is noted.   IMPRESSION: Mild diffuse soft tissue swelling without evidence of acute osseous abnormality.    PATIENT SURVEYS:  Lower Extremity Functional Score: 49 / 80 = 61.3 %  COGNITION: Overall cognitive status: Within functional limits for tasks assessed     SENSATION: Light touch: Impaired  Dec on L L5 dermatome   EDEMA:  Figure 8: Equal on both sides  MUSCLE LENGTH: Hamstrings: inc tightness in L compared to R during 90/90 test   POSTURE: No Significant postural limitations  PALPATION: TTP anterior aspect of L ankle, around medial and lateral malleoli on L, TTP on distal achilles tendon on L (tightness noted)   LOWER EXTREMITY ROM:  Active ROM Right eval Left eval  Hip flexion    Hip extension    Hip abduction    Hip adduction    Hip internal rotation    Hip external rotation    Knee flexion WNL WNL  Knee extension WNL WNL  Ankle dorsiflexion WNL 10  Ankle plantarflexion WNL 25  Ankle inversion WNL 10  Ankle eversion WNL 15   (Blank rows = not tested)     LOWER EXTREMITY MMT:  MMT Right eval Left eval  Hip flexion    Hip extension    Hip abduction    Hip adduction    Hip internal rotation    Hip external rotation    Knee flexion    Knee extension    Ankle dorsiflexion 5 4  Ankle plantarflexion 5 4+ *  Ankle inversion    Ankle eversion     (Blank rows = not tested)   *=painful  LOWER EXTREMITY SPECIAL TESTS:  Ankle special tests: Anterior drawer test: negative for excessive  movement but, positive for pain  FUNCTIONAL TESTS:  2 minute walk test: 363' CAM boot donned  GAIT: Distance walked: 96' Assistive device utilized: CAM boot  Level of assistance: Modified independence Comments: Dec weight shift/stance time onto LLE, slightly dec velocity, reports slight R knee pain due to favoring RLE  TREATMENT DATE:  05/01/2024  Manual Therapy: -PA and APs of Left ankle -STM of posterior left ankle on medial and lateral side  Therapeutic Exercise: -Seated heel raises and toe raises, 3 sets of 10 reps -Seated toe and arch crunches, 2 sets of 10 reps with 3 seconds hold -Baps board seated, 10 circles each direction -Baps board seated, Eversion/Inversion taps, 2 sets of 10 reps -Supine ankle ABCs, 1 sets following manual -Stationary bike, 3.5 minutes with no resistance, pt cued for pain free speed      04/29/24/: PT eval and HEP    PATIENT EDUCATION:  Education details: PT evaluation, objective findings, POC, Importance of HEP, Precautions, Clinic policies Person educated: Patient Education method: Explanation and Demonstration Education comprehension: verbalized understanding and returned demonstration  HOME EXERCISE PROGRAM: Access Code: [email or web address] URL: [email or web address] Date: 04/29/2024 Prepared by: Rosaria Powell-Butler  Exercises - Supine Hamstring Stretch with Strap  - 2 x daily - 7 x weekly - 3 sets - 30 hold - Seated Gastroc Stretch with Strap  - 2 x daily - 7 x weekly - 3 sets - 15 hold - Seated Heel Raise  - 2 x daily - 7 x weekly - 3 sets - 10 reps - Seated Heel Toe Raises  - 2 x daily - 7 x weekly - 3 sets - 10 reps  ASSESSMENT:  CLINICAL IMPRESSION: Patient continues to demonstrate pain and tenderness to left ankle, decreased LLE strength, decreased gait quality and balance. Patient taken out of CAM  boot for exercise today. Patient able to progress dynamic ankle and foot intrinsic activation exercises today with arch/toe crunches and stationary bike, good performance with verbal cueing. Pt responds well to manual techniques with decreased pain reported at end of session. Patient would continue to benefit from skilled physical therapy for decreased ankle pain, increased endurance with ambulation, increased LLE strength, and improved balance for improved quality of life, improved independence with gait training and continued progress towards therapy goals.  Patient is a 33 y.o. female who was seen today for physical therapy evaluation and treatment for M76.822 (ICD-10-CM) - Insufficiency of left posterior tibial tendon. Patient arrives to PT evaluation with CAM boot donned. Patient demonstrates decreased L ankle ROM, dec L ankle strength, altered gait, pain with palpation, and pain during functional activities. Patient with negative anterior drawer test with no excessive motion but reports increased pain during. Also note of mild swelling in sinus tarsi area as well as around lateral malleoli. Patient has been in the boot for about 3 weeks, and was instructed to continue wear for at least 3 more weeks (6 weeks total) by MD. Patient educated on importance of supportive shoes while at work, shoes with better arch support. Patient will benefit from continued skilled physical therapy in order to address the above to reduce pain and improve function/QOL.    OBJECTIVE IMPAIRMENTS: Abnormal gait, decreased activity tolerance, decreased endurance, decreased mobility, difficulty walking, decreased ROM, decreased strength, improper body mechanics, and pain.   ACTIVITY LIMITATIONS: carrying, lifting, bending, sitting, standing, squatting, stairs, and transfers  PARTICIPATION LIMITATIONS: meal prep, cleaning, laundry, community activity, and occupation  PERSONAL FACTORS: N/A are also affecting patient's functional  outcome.   REHAB POTENTIAL: Good  CLINICAL DECISION MAKING: Stable/uncomplicated  EVALUATION COMPLEXITY: Low   GOALS: Goals reviewed with patient? Yes  SHORT TERM GOALS: Target date: 05/13/24 Patient will be independent with performance of HEP to demonstrate adequate self management of symptoms.  Baseline:  Goal status: INITIAL  2.  Patient will report at least a 25% improvement with function or pain overall since beginning PT. Baseline:  Goal status: INITIAL   LONG TERM GOALS: Target date: 06/10/24 Patient will improve LEFS score by 9 points to demonstrate improved perceived function while meeting MCID.  Baseline: Goal status: INITIAL 2.  Patient will improve L ankle DF ROM to at least 20 degrees to show improved LE mobility for improve functional transfers and QOL.  Baseline:  Goal status: INITIAL 3.  Patient will improve L ankle PF ROM to at least 50 degrees to show improved LE mobility for improve functional transfers and QOL. Baseline:  Goal status: INITIAL   4.  Patient will increase gait distance to at least 400 ft without CAM boot to demonstrate improved functional mobility walking household and community distances. Baseline: Goal status: INITIAL 5. Patient will report pain rating as 3/10 or below following a day at work to demonstrate improved activity tolerance.  Baseline: Goal status: INITIAL   PLAN:  PT FREQUENCY: 2x/week  PT DURATION: 6 weeks  PLANNED INTERVENTIONS: 97164- PT Re-evaluation, 97110-Therapeutic exercises, 97530- Therapeutic activity, [id number]- Neuromuscular re-education, 97535- Self Care, 02859- Manual therapy, [phone]- Gait training, [phone]- Electrical stimulation (manual), [id number]- Traction (mechanical), [phone] (1-2 muscles), 20561 (3+ muscles)- Dry Needling, Patient/Family education, Balance training, Stair training, Taping, Joint mobilization, Spinal mobilization, Cryotherapy, and Moist heat  PLAN FOR NEXT SESSION:  assess balance (static and/or  dynamic), possible taping of L ankle for stability, cont to progress L ankle ROM and strength   Lang Ada, PT, DPT [facility] Office: [phone] 11:03 AM, 05/01/24

## 2024-05-10 ENCOUNTER — Ambulatory Visit: Admitting: Orthopedic Surgery

## 2024-05-13 ENCOUNTER — Ambulatory Visit: Admitting: Orthopedic Surgery

## 2024-05-20 ENCOUNTER — Ambulatory Visit: Admitting: Orthopedic Surgery

## 2024-05-22 ENCOUNTER — Encounter (HOSPITAL_COMMUNITY): Admitting: Physical Therapy

## 2024-05-22 ENCOUNTER — Telehealth (HOSPITAL_COMMUNITY): Payer: Self-pay | Admitting: Physical Therapy

## 2024-05-22 NOTE — Telephone Encounter (Signed)
 Pt did not show for appt.  Called and left voicemail regarding missed appt and reminder for next one scheduled next week.   Greig KATHEE Fuse, PTA/CLT [facility] Ph: [phone]

## 2024-05-29 ENCOUNTER — Telehealth (HOSPITAL_COMMUNITY): Payer: Self-pay | Admitting: Physical Therapy

## 2024-05-29 ENCOUNTER — Encounter (HOSPITAL_COMMUNITY): Admitting: Physical Therapy

## 2024-05-29 NOTE — Telephone Encounter (Signed)
 Today makes pt's second NS.  Removed all remaining appt except next per NS policy.  Called and left VM explaining NS policy and request to cancel if she is not able to make her last scheduled appt.    Greig KATHEE Fuse, PTA/CLT [facility] Ph: [phone]

## 2024-05-31 ENCOUNTER — Encounter (HOSPITAL_COMMUNITY)

## 2024-05-31 ENCOUNTER — Encounter (HOSPITAL_COMMUNITY): Payer: Self-pay

## 2024-05-31 NOTE — Therapy (Signed)
 [facility] Rehabilitation at [facility] [address] [place], [place], [address] Phone: [phone]   Fax:  [phone]  Patient Details  Name: Alicia [name] MRN: [id number] Date of Birth: [birth year] Referring Provider:  No ref. provider found  Encounter Date: 05/31/2024  PHYSICAL THERAPY DISCHARGE SUMMARY  Visits from Start of Care: 1  Current functional level related to goals / functional outcomes: Please see last treatment note.   Remaining deficits: Please see last treatment note.   Education / Equipment: Please see last treatment note.   Patient agrees to discharge. Patient goals were not met. Patient is being discharged due to not returning since the last visit.  Pt was called concerning her third missed appointment this morning. Pt was left voicemail with education on no show policy and that she will be discharged after today. Pt also educated on need for new referral should she need therapy services in the future, we will cancel all future appointments at this time.    Lang Ada, PT, DPT [facility] Office: [phone] 12:04 PM, 05/31/24   [facility] at [facility] [address] [place], [place], [address] Phone: [phone]   Fax:  [phone]

## 2024-06-04 ENCOUNTER — Encounter (HOSPITAL_COMMUNITY): Admitting: Physical Therapy

## 2024-06-06 ENCOUNTER — Encounter (HOSPITAL_COMMUNITY): Admitting: Physical Therapy

## 2024-06-11 ENCOUNTER — Encounter (HOSPITAL_COMMUNITY)

## 2024-06-13 ENCOUNTER — Encounter (HOSPITAL_COMMUNITY)

## 2024-06-18 ENCOUNTER — Encounter (HOSPITAL_COMMUNITY)

## 2024-06-20 ENCOUNTER — Encounter (HOSPITAL_COMMUNITY): Admitting: Physical Therapy

## 2024-06-25 ENCOUNTER — Encounter (HOSPITAL_COMMUNITY)

## 2024-08-15 ENCOUNTER — Encounter: Payer: Self-pay | Admitting: Women's Health

## 2024-08-15 ENCOUNTER — Ambulatory Visit: Admitting: Women's Health

## 2024-08-15 VITALS — BP 118/77 | HR 102 | Ht 62.0 in | Wt 155.0 lb

## 2024-08-15 DIAGNOSIS — Z3046 Encounter for surveillance of implantable subdermal contraceptive: Secondary | ICD-10-CM

## 2024-08-15 DIAGNOSIS — Z3202 Encounter for pregnancy test, result negative: Secondary | ICD-10-CM

## 2024-08-15 DIAGNOSIS — Z30011 Encounter for initial prescription of contraceptive pills: Secondary | ICD-10-CM

## 2024-08-15 LAB — POCT URINE PREGNANCY: Preg Test, Ur: NEGATIVE

## 2024-08-15 MED ORDER — LO LOESTRIN FE 1 MG-10 MCG / 10 MCG PO TABS: 1.0000 | ORAL_TABLET | Freq: Every day | ORAL | 3 refills | Status: AC

## 2024-08-15 NOTE — Progress Notes (Signed)
 NEXPLANON  REMOVAL Patient name: Alicia [name] MRN [id number]  Date of birth: 1991-03-12 Subjective Findings:   Alicia [name] is a 33 y.o. [id number] African American female being seen today for removal of a Nexplanon . Her Nexplanon  was placed 06/23/21.  She desires removal because its past due. Signed copy of informed consent in chart. She does not want Nexplanon  again, has monthly periods q 3wks or so, feels tampons are causing pH imbalance.   Patient's last menstrual period was 07/16/2024. Last pap9/25/24. Results were: NILM w/ HRHPV negative The planned method of family planning is OCP (estrogen/progesterone)/abstinence. Does not smoke, no h/o HTN, DVT/PE, CVA, MI, or migraines w/ aura.      07/19/2023    2:12 PM 04/12/2021   11:37 AM 03/09/2021    9:58 AM 12/08/2020   11:42 AM 01/28/2020    9:28 AM  Depression screen PHQ 2/9  Decreased Interest 0 0 0 0 0  Down, Depressed, Hopeless 0 0 0 0 0  PHQ - 2 Score 0 0 0 0 0  Altered sleeping 0 0 0 3 0  Tired, decreased energy 0 0 0 0 0  Change in appetite 0 0 0 2 0  Feeling bad or failure about yourself  0 0 0 0 0  Trouble concentrating 0 0 0 0 0  Moving slowly or fidgety/restless 0 0 0 0 0  Suicidal thoughts 0 0 0 0 0  PHQ-9 Score 0 0 0 5 0        07/19/2023    2:12 PM 04/12/2021   11:37 AM 03/09/2021    9:58 AM 12/08/2020   11:42 AM  GAD 7 : Generalized Anxiety Score  Nervous, Anxious, on Edge 0 0 0 0  Control/stop worrying 0 0 0 0  Worry too much - different things 0 0 0 1  Trouble relaxing 0 0 0 0  Restless 0 0 0 0  Easily annoyed or irritable 1 0 0 0  Afraid - awful might happen 0 0 0 0  Total GAD 7 Score 1 0 0 1     Pertinent History Reviewed:   Reviewed past medical,surgical, social, obstetrical and family history.  Reviewed problem list, medications and allergies. Objective Findings & Procedure:    Vitals:   08/15/24 1535  BP: 118/77  Pulse: (!) 102  Weight: 155 lb (70.3 kg)  Height: 5' 2 (1.575 m)  Body mass  index is 28.35 kg/m.  Results for orders placed or performed in visit on 08/15/24 (from the past 24 hours)  POCT urine pregnancy   Collection Time: 08/15/24  3:55 PM  Result Value Ref Range   Preg Test, Ur Negative Negative     Time out was performed.  Nexplanon  site identified.  Area prepped in usual sterile fashon. One cc of 2% lidocaine  was used to anesthetize the area at the distal end of the implant. A small stab incision was made right beside the implant on the distal portion.  The Nexplanon  rod was grasped using hemostats and removed without difficulty.  There was less than 3 cc blood loss. There were no complications.  Steri-strips were applied over the small incision and a pressure bandage was applied.  The patient tolerated the procedure well. Assessment & Plan:   1) Nexplanon  removal She was instructed to keep the area clean and dry, remove pressure bandage in 24 hours, and keep insertion site covered with the steri-strip for 3-5 days.   Follow-up PRN problems.  2) Contraception  management> rx LoLo, condoms x 2wks if sex, f/u  Orders Placed This Encounter  Procedures   POCT urine pregnancy    Follow-up: Return in about 3 months (around 11/15/2024) for med f/u, CNM, in person.  Suzen JONELLE Fetters CNM, [facility] 08/15/2024 4:08 PM

## 2024-08-15 NOTE — Patient Instructions (Signed)
 Keep the area clean and dry.  You can remove the big bandage in 24 hours, and the small steri-strip bandage in 3-5 days.  A back up method, such as condoms, should be used for two weeks.

## 2024-09-25 ENCOUNTER — Encounter: Payer: Self-pay | Admitting: Women's Health

## 2024-09-27 ENCOUNTER — Ambulatory Visit

## 2024-09-27 ENCOUNTER — Other Ambulatory Visit (HOSPITAL_COMMUNITY)
Admission: RE | Admit: 2024-09-27 | Discharge: 2024-09-27 | Disposition: A | Source: Ambulatory Visit | Attending: Obstetrics & Gynecology | Admitting: Obstetrics & Gynecology

## 2024-09-27 DIAGNOSIS — N898 Other specified noninflammatory disorders of vagina: Secondary | ICD-10-CM

## 2024-09-27 NOTE — Progress Notes (Signed)
   NURSE VISIT- VAGINITIS  SUBJECTIVE:  Alicia [name] is a 33 y.o. [id number] GYN patientfemale here for a vaginal swab for vaginitis screening.  She reports the following symptoms: discharge described as yellow and odor for a few years (since change in birth control. Denies abnormal vaginal bleeding, significant pelvic pain, fever, or UTI symptoms.  OBJECTIVE:  There were no vitals taken for this visit.  Appears well, in no apparent distress  ASSESSMENT: Vaginal swab for vaginitis screening  PLAN: Self-collected vaginal probe for Bacterial Vaginosis, Yeast sent to lab Treatment: to be determined once results are received Follow-up as needed if symptoms persist/worsen, or new symptoms develop  Aleck FORBES Blase  09/27/2024 10:33 AM

## 2024-09-30 ENCOUNTER — Ambulatory Visit: Payer: Self-pay | Admitting: Adult Health

## 2024-09-30 LAB — CERVICOVAGINAL ANCILLARY ONLY
Bacterial Vaginitis (gardnerella): POSITIVE — AB
Candida Glabrata: NEGATIVE
Candida Vaginitis: NEGATIVE
Comment: NEGATIVE
Comment: NEGATIVE
Comment: NEGATIVE

## 2024-09-30 MED ORDER — METRONIDAZOLE 500 MG PO TABS: 500.0000 mg | ORAL_TABLET | Freq: Two times a day (BID) | ORAL | 0 refills | Status: AC
